# Patient Record
Sex: Female | Born: 1975 | Race: White | Hispanic: No | Marital: Married | Smoking: Former smoker
Health system: Southern US, Community
[De-identification: ages and names within clinical notes are randomized; demographics above are authoritative.]

## PROBLEM LIST (undated history)

## (undated) DIAGNOSIS — K118 Other diseases of salivary glands: Secondary | ICD-10-CM

## (undated) DIAGNOSIS — E079 Disorder of thyroid, unspecified: Secondary | ICD-10-CM

## (undated) DIAGNOSIS — E049 Nontoxic goiter, unspecified: Secondary | ICD-10-CM

## (undated) DIAGNOSIS — Z9889 Other specified postprocedural states: Secondary | ICD-10-CM

## (undated) DIAGNOSIS — I10 Essential (primary) hypertension: Secondary | ICD-10-CM

## (undated) DIAGNOSIS — E785 Hyperlipidemia, unspecified: Secondary | ICD-10-CM

## (undated) DIAGNOSIS — E039 Hypothyroidism, unspecified: Secondary | ICD-10-CM

## (undated) DIAGNOSIS — R112 Nausea with vomiting, unspecified: Secondary | ICD-10-CM

## (undated) DIAGNOSIS — C73 Malignant neoplasm of thyroid gland: Secondary | ICD-10-CM

## (undated) HISTORY — PX: ABLATION: SHX5711

---

## 2005-11-20 ENCOUNTER — Emergency Department (HOSPITAL_COMMUNITY): Admission: EM | Admit: 2005-11-20 | Discharge: 2005-11-20 | Payer: Self-pay | Admitting: Emergency Medicine

## 2005-11-25 ENCOUNTER — Ambulatory Visit (HOSPITAL_COMMUNITY): Admission: RE | Admit: 2005-11-25 | Discharge: 2005-11-25 | Payer: Self-pay | Admitting: Family Medicine

## 2005-11-26 ENCOUNTER — Encounter (HOSPITAL_COMMUNITY): Admission: RE | Admit: 2005-11-26 | Discharge: 2005-12-26 | Payer: Self-pay | Admitting: Family Medicine

## 2005-12-13 ENCOUNTER — Ambulatory Visit (HOSPITAL_COMMUNITY): Admission: RE | Admit: 2005-12-13 | Discharge: 2005-12-13 | Payer: Self-pay | Admitting: Gastroenterology

## 2006-05-30 ENCOUNTER — Ambulatory Visit (HOSPITAL_COMMUNITY): Admission: RE | Admit: 2006-05-30 | Discharge: 2006-05-30 | Payer: Self-pay | Admitting: Gastroenterology

## 2006-05-30 ENCOUNTER — Encounter (INDEPENDENT_AMBULATORY_CARE_PROVIDER_SITE_OTHER): Payer: Self-pay | Admitting: *Deleted

## 2006-08-17 ENCOUNTER — Emergency Department (HOSPITAL_COMMUNITY): Admission: EM | Admit: 2006-08-17 | Discharge: 2006-08-17 | Payer: Self-pay | Admitting: Emergency Medicine

## 2006-12-17 ENCOUNTER — Emergency Department (HOSPITAL_COMMUNITY): Admission: EM | Admit: 2006-12-17 | Discharge: 2006-12-17 | Payer: Self-pay | Admitting: Family Medicine

## 2007-01-22 ENCOUNTER — Ambulatory Visit: Payer: Self-pay | Admitting: Gastroenterology

## 2007-01-30 ENCOUNTER — Ambulatory Visit (HOSPITAL_COMMUNITY): Admission: RE | Admit: 2007-01-30 | Discharge: 2007-01-30 | Payer: Self-pay | Admitting: Internal Medicine

## 2007-08-24 ENCOUNTER — Ambulatory Visit (HOSPITAL_COMMUNITY): Admission: RE | Admit: 2007-08-24 | Discharge: 2007-08-24 | Payer: Self-pay | Admitting: Internal Medicine

## 2007-12-24 ENCOUNTER — Ambulatory Visit (HOSPITAL_COMMUNITY): Admission: RE | Admit: 2007-12-24 | Discharge: 2007-12-24 | Payer: Self-pay | Admitting: *Deleted

## 2008-03-09 ENCOUNTER — Encounter: Admission: RE | Admit: 2008-03-09 | Discharge: 2008-03-09 | Payer: Self-pay | Admitting: *Deleted

## 2008-06-10 ENCOUNTER — Inpatient Hospital Stay (HOSPITAL_COMMUNITY): Admission: AD | Admit: 2008-06-10 | Discharge: 2008-06-11 | Payer: Self-pay | Admitting: Obstetrics

## 2008-08-11 ENCOUNTER — Encounter: Admission: RE | Admit: 2008-08-11 | Discharge: 2008-08-11 | Payer: Self-pay | Admitting: Obstetrics

## 2008-09-09 ENCOUNTER — Inpatient Hospital Stay (HOSPITAL_COMMUNITY): Admission: AD | Admit: 2008-09-09 | Discharge: 2008-09-09 | Payer: Self-pay | Admitting: Obstetrics

## 2008-09-13 ENCOUNTER — Inpatient Hospital Stay (HOSPITAL_COMMUNITY): Admission: AD | Admit: 2008-09-13 | Discharge: 2008-09-14 | Payer: Self-pay | Admitting: Obstetrics

## 2008-10-17 ENCOUNTER — Inpatient Hospital Stay (HOSPITAL_COMMUNITY): Admission: AD | Admit: 2008-10-17 | Discharge: 2008-10-20 | Payer: Self-pay | Admitting: Obstetrics and Gynecology

## 2008-10-18 ENCOUNTER — Encounter (INDEPENDENT_AMBULATORY_CARE_PROVIDER_SITE_OTHER): Payer: Self-pay | Admitting: Obstetrics & Gynecology

## 2009-03-24 ENCOUNTER — Ambulatory Visit (HOSPITAL_COMMUNITY): Admission: RE | Admit: 2009-03-24 | Discharge: 2009-03-24 | Payer: Self-pay | Admitting: Internal Medicine

## 2009-06-05 ENCOUNTER — Encounter (INDEPENDENT_AMBULATORY_CARE_PROVIDER_SITE_OTHER): Payer: Self-pay | Admitting: Surgery

## 2009-06-05 ENCOUNTER — Ambulatory Visit (HOSPITAL_COMMUNITY): Admission: RE | Admit: 2009-06-05 | Discharge: 2009-06-05 | Payer: Self-pay | Admitting: Surgery

## 2009-06-05 HISTORY — PX: LAPAROSCOPIC CHOLECYSTECTOMY: SUR755

## 2010-05-12 ENCOUNTER — Inpatient Hospital Stay (HOSPITAL_COMMUNITY): Admission: AD | Admit: 2010-05-12 | Discharge: 2010-05-12 | Payer: Self-pay | Admitting: Obstetrics and Gynecology

## 2010-11-14 ENCOUNTER — Ambulatory Visit (HOSPITAL_COMMUNITY)
Admission: RE | Admit: 2010-11-14 | Discharge: 2010-11-14 | Payer: Self-pay | Source: Home / Self Care | Attending: Obstetrics | Admitting: Obstetrics

## 2011-02-03 LAB — URINALYSIS, ROUTINE W REFLEX MICROSCOPIC
Bilirubin Urine: NEGATIVE
Ketones, ur: NEGATIVE mg/dL
Nitrite: NEGATIVE
Protein, ur: NEGATIVE mg/dL
Urobilinogen, UA: 0.2 mg/dL (ref 0.0–1.0)

## 2011-02-03 LAB — URINE MICROSCOPIC-ADD ON

## 2011-02-03 LAB — HCG, QUANTITATIVE, PREGNANCY: hCG, Beta Chain, Quant, S: 1096 m[IU]/mL — ABNORMAL HIGH (ref <5)

## 2011-02-24 LAB — BASIC METABOLIC PANEL
Calcium: 9.9 mg/dL (ref 8.4–10.5)
Chloride: 102 mEq/L (ref 96–112)
Creatinine, Ser: 0.75 mg/dL (ref 0.4–1.2)
GFR calc non Af Amer: >60 mL/min (ref >60)

## 2011-02-24 LAB — GLUCOSE, CAPILLARY: Glucose-Capillary: 167 mg/dL — ABNORMAL HIGH (ref 70–99)

## 2011-02-24 LAB — HEMOGLOBIN AND HEMATOCRIT, BLOOD
HCT: 43.1 % (ref 36.0–46.0)
Hemoglobin: 14.4 g/dL (ref 12.0–15.0)

## 2011-04-02 ENCOUNTER — Encounter (HOSPITAL_COMMUNITY): Payer: 59

## 2011-04-02 ENCOUNTER — Other Ambulatory Visit: Payer: Self-pay | Admitting: Obstetrics

## 2011-04-02 LAB — SURGICAL PCR SCREEN
MRSA, PCR: NEGATIVE
Staphylococcus aureus: NEGATIVE

## 2011-04-02 LAB — CBC
Hemoglobin: 10.9 g/dL — ABNORMAL LOW (ref 12.0–15.0)
MCH: 25.2 pg — ABNORMAL LOW (ref 26.0–34.0)
MCV: 79 fL (ref 78.0–100.0)
RBC: 4.33 MIL/uL (ref 3.87–5.11)
WBC: 9.5 10*3/uL (ref 4.0–10.5)

## 2011-04-02 LAB — BASIC METABOLIC PANEL
BUN: 5 mg/dL — ABNORMAL LOW (ref 6–23)
CO2: 21 mEq/L (ref 19–32)
Chloride: 106 mEq/L (ref 96–112)
Creatinine, Ser: 0.57 mg/dL (ref 0.4–1.2)
GFR calc Af Amer: >60 mL/min (ref >60)
Potassium: 2.8 mEq/L — ABNORMAL LOW (ref 3.5–5.1)

## 2011-04-02 NOTE — Op Note (Signed)
NAMESUETTA, Carla Knight             ACCOUNT NO.:  [id number]   MEDICAL RECORD NO.:  [id number]          PATIENT TYPE:  AMB   LOCATION:  DAY                          FACILITY:  [facility]   PHYSICIAN:  Thornton Park. Daphine Deutscher, MD  DATE OF BIRTH:  1976/07/29   DATE OF PROCEDURE:  06/05/2009  DATE OF DISCHARGE:                               OPERATIVE REPORT   PREOPERATIVE DIAGNOSES:  Chronic cholecystitis, cholelithiasis.   POSTOPERATIVE DIAGNOSES:  Chronic cholecystitis, cholelithiasis also  with the cesarean section hernia, lower midline.   PROCEDURE:  Laparoscopic cholecystectomy.   SURGEON:  Thornton Park. Daphine Deutscher, MD.   ASSISTANTTroy Sine. Dwain Sarna, MD.   ANESTHESIA:  General endotracheal.   DESCRIPTION OF PROCEDURE:  Carla Knight is a 35 year old lady who was  taken to room 1 and given general anesthesia on Monday, June 05, 2009.  The abdomen was prepped with a Techni-Care equivalent and draped  sterilely.  I made a longitudinal incision down to the umbilicus and  worked our way into her abdomen and placed a Hasson cannula and  insufflated.  The abdomen was reviewed and focusing on the upper  abdomen.  The gallbladder was grasped, elevated.  Three trocars were  placed the upper abdomen for dissection of Calot's triangle which was  done and critical view was achieved.  A clip was placed up on the  gallbladder the cystic duct was incised and a Reddick catheter was  inserted.  A dynamic cholangiogram revealed good intrahepatic filling.  free flow into the duodenum, but also with some retrograde filling up  the pancreatic duct.  The patient has a common channel.  The cystic duct  was then triple clipped and divided.  The cystic artery was double  clipped and divided and then the gallbladder was removed from the  gallbladder bed without entering it with the hook electrocautery.  It  was placed in a bag and brought out through the umbilicus.  Meanwhile,  the umbilical port was repaired  with two sutures of zero Vicryl.  While  looking down at that closure we noticed that she had a C-section hernia  which was basically the entire length of the incision and it was fairly  broad.  This will be a consideration for repair if she has a subsequent  child and a C-section.  Nothing  was incarcerated in it nor was there  any and because of its size I  did not think  anything was in eminent danger of getting incarcerated.  In the  gallbladder bed no bleeding or bile leaks were noted.  The ports were  injected with Marcaine and then the abdomen was deflated and the skin  was closed 4-0 Vicryl.  The patient seemed to tolerate the procedure  well and was taken to the recovery room in satisfactory condition.      Thornton Park Daphine Deutscher, MD  Electronically Signed     MBM/MEDQ  D:  06/05/2009  T:  06/05/2009  Job:  [id number]   cc:   Kingsley Callander. Ouida Sills, MD  Fax: [phone]   Lendon Colonel, MD  Fax: [phone]

## 2011-04-02 NOTE — Op Note (Signed)
Carla Knight, Carla Knight             ACCOUNT NO.:  [id number]   MEDICAL RECORD NO.:  [id number]          PATIENT TYPE:  INP   LOCATION:  9117                          FACILITY:  [facility]   PHYSICIAN:  Genia Del, M.D.DATE OF BIRTH:  1975/11/20   DATE OF PROCEDURE:  DATE OF DISCHARGE:                               OPERATIVE REPORT   PREOPERATIVE DIAGNOSES:  1. 38+ weeks' gestation.  2. Gestational diabetes mellitus, A2.  3. Chronic hypertension.  4. Failure to progress.  5. Dilatation and descent.   POSTOPERATIVE DIAGNOSES:  1. 38+ weeks' gestation.  2. Gestational diabetes mellitus, A2.  3. Chronic hypertension.  4. Failure to progress.  5. Dilatation and descent.   PROCEDURE:  Urgent primary low-transverse C-section.   SURGEON:  Genia Del, MD   No assistant.   PROCEDURE:  Under epidural anesthesia, the patient was in 15 degrees  left decubitus position.  She was prepped with Betadine on the  abdominal, suprapubic, and vulvar areas.  The bladder catheter was  already in place and the patient was draped as usual.  The level of  anesthesia was verified and was adequate and infiltration of Marcaine  one-quarter plain 10 mL was done at the site of the Pfannenstiel.  A  Pfannenstiel incision was done with a scalpel.  We then opened the  adipose tissue with the electrocautery and opened the aponeurosis  transversely with the electrocautery and Mayo scissors.  We separated  the recti muscles from the aponeurosis on the midline superiorly and  inferiorly.  We opened the parietal peritoneum longitudinally with [facility]  scissors.  We then put in place the bladder retractor.  The visceral  peritoneum was opened transversely over the lower uterine segment and  the bladder was reclined downward.  The bladder retractor was  repositioned.  We then made a low-transverse hysterotomy with a scalpel  extension on each side with dressing scissors.  Amniotic fluid was  clear.  The fetus  was in cephalic presentation, occiput posterior, birth  of a baby girl at 6:19.  The baby was suctioned.  The cord was clamped  and cut.  The baby was given to the neonatal team.  Apgars are 8 and 9.  We then removed the placenta manually.  It was sent to Pathology.  The  patient is a cord blood donor.  Revision of the intrauterine cavity.  Pitocin was started in the IV fluids.  A dose of Ancef 1 g IV was given.  Both ovaries and both tubes were normal to inspection.  A small  subserosal fibroid 1 cm was present on the anterior surface of the  uterus.  We closed the hysterotomy and a first locked running suture of  Vicryl 0, a second plane in a mattress fashion was done with Vicryl 0,  that completes hemostasis.  We then irrigated and suctioned the  abdominopelvic cavities.  We completed hemostasis on the recti muscles  with the electrocautery where necessary.  We then closed the aponeurosis  with 2 half running sutures of Vicryl 0.  We completed hemostasis with  the electrocautery on the adipose tissue.  Separate stitches of plain 0  were  done on the adipose tissue, and the skin was reapproximated with  staples.  A dry compressive dressing was applied.  The estimated blood  loss was 800 mL.  The count of sponges and instrument was complete x2.  The patient was brought to recovery room in good stable status.      Genia Del, M.D.  Electronically Signed     ML/MEDQ  D:  10/18/2008  T:  10/18/2008  Job:  [id number]

## 2011-04-05 NOTE — Discharge Summary (Signed)
Carla Knight, Carla Knight             ACCOUNT NO.:  [id number]   MEDICAL RECORD NO.:  [id number]           PATIENT TYPE:   LOCATION:                                 FACILITY:   PHYSICIAN:  Genia Del, M.D.DATE OF BIRTH:  12/22/1975   DATE OF ADMISSION:  DATE OF DISCHARGE:  10/20/2008                               DISCHARGE SUMMARY   ADMISSION DIAGNOSES:  1. A 38 plus weeks' gestation.  2. Gestational diabetes mellitus A2.  3. Chronic hypertension with spontaneous labor and spontaneous rupture      of membranes.   DISCHARGE DIAGNOSES:  1. A 38 plus weeks' gestation.  2. Gestational diabetes mellitus A2.  3. Chronic hypertension with spontaneous labor and spontaneous rupture      of membranes.  4. Failure to progress in labor.   PROCEDURE:  Urgent primary low transverse C-section.   HOSPITAL COURSE:  The patient was in labor and progressed to 9 cm.  She  had an arrest of progression of dilatation and descent over a 3-hour  period with adequate uterine contractions.  The decision was taken to  proceed with an urgent C-section.  A low-transverse primary urgent C-  section was done.  A baby girl was born.  Apgars were 8 and 9.  No  complications occurred.  The estimated blood loss was 800 mL.  A dose of  Ancef IV was given.  The postop evaluation was unremarkable.  She  remained afebrile and hemodynamically stable.  Her postop hemoglobin was  8.3 and hematocrit 24.7.  She was discharged home in good stable status  on postop day #2.  She was continued on labetalol for chronic  hypertension at 400 mg t.i.d.  Pain medication was prescribed.  Postop  advice were given and she will follow up at [facility].      Genia Del, M.D.  Electronically Signed     ML/MEDQ  D:  12/14/2008  T:  12/14/2008  Job:  [id number]

## 2011-04-05 NOTE — Op Note (Signed)
NAMEMargarite Knight                ACCOUNT NO.:  [id number]   MEDICAL RECORD NO.:  [id number]          PATIENT TYPE:  AMB   LOCATION:  ENDO                         FACILITY:  [facility]   PHYSICIAN:  Anselmo Rod, M.D.  DATE OF BIRTH:  Jun 26, 1976   DATE OF PROCEDURE:  DATE OF DISCHARGE:                                 OPERATIVE REPORT   PROCEDURE PERFORMED:  Esophagogastroduodenoscopy with gastric biopsies.   ENDOSCOPIST:  Anselmo Rod MD.   INSTRUMENT USED:  Olyumpus video panendoscope.   INDICATIONS FOR PROCEDURE:  A 35 year old white female with a history of  epigastric pain radiating to the umbilical area, rule out peptic ulcer  disease, esophagitis, gastritis, etc.  Patient has had abnormal abdominal  ultrasound and HIDA scan with 74% ejection fraction.   PRE-PROCEDURE PREPARATION:  Informed consent was secured from the patient  and the patient  fasted for four hours prior to the procedure. The risks and  benefits of the procedure were discussed in great detail.   PRE-PROCEDURE PHYSICAL:  Patient stable vital signs. Neck supple. Chest  clear to auscultation. S1, S2, regular. Abdomen is soft with normal bowel  sounds.   DESCRIPTION OF PROCEDURE:  The patient is placed in the left lateral  decubitus position, sedated with 100 mcg of fentanyl and 10 mg of versed and  in slow incremental doses.  Once the patient was adequately sedated and  maintained on low-flow oxygen, continuous cardiac monitoring.  The Olympus  panendoscope was advanced through the mouth piece, over the tongue, into the  esophagus under direct vision. The entire esophagus appeared normal with no  evidence of ring, esophagitis or Barrett mucosa.  The scope was then  advanced in the stomach.  Mild diffuse gastritis was noted.  Biopsies were  done to rule out presence of H. Pylori.  The  proximal small bowel appeared  normal.  There was no evidence of a hiatal hernia or high retroflexion. The  patient  tolerated the procedure well without immediate complications.   IMPRESSION:  1.Normal appearing esophagus.  2.Mild diffuse gastritis, biopsies done for H. Pylori.  3.Normal proximal small bowel.   RECOMMENDATIONS:  1.Proceed with a CT of the abdomen and pelvis.  2.Continue PPIs for now.  3.Treat with antibiotics if H. Pylori present on biopsies.  4.The patient follow up,as an outpatient, in the next 2 weeks, for further  recommendations.      Anselmo Rod, M.D.  Electronically Signed     JNM/MEDQ  D:  05/30/2006  T:  05/31/2006  Job:  [id number]   cc:   Marina Gravel, M.D.   Kingsley Callander. Ouida Sills, MD  Fax: [phone]

## 2011-04-08 ENCOUNTER — Other Ambulatory Visit: Payer: Self-pay | Admitting: Obstetrics

## 2011-04-08 ENCOUNTER — Inpatient Hospital Stay (HOSPITAL_COMMUNITY)
Admission: RE | Admit: 2011-04-08 | Discharge: 2011-04-10 | DRG: 765 | Disposition: A | Discharge status: Home / Self Care | Payer: 59 | Source: Ambulatory Visit | Attending: Obstetrics | Admitting: Obstetrics

## 2011-04-08 DIAGNOSIS — O2432 Unspecified pre-existing diabetes mellitus in childbirth: Secondary | ICD-10-CM | POA: Diagnosis present

## 2011-04-08 DIAGNOSIS — E119 Type 2 diabetes mellitus without complications: Secondary | ICD-10-CM | POA: Diagnosis present

## 2011-04-08 DIAGNOSIS — O1002 Pre-existing essential hypertension complicating childbirth: Secondary | ICD-10-CM | POA: Diagnosis present

## 2011-04-08 DIAGNOSIS — O09529 Supervision of elderly multigravida, unspecified trimester: Secondary | ICD-10-CM | POA: Diagnosis present

## 2011-04-08 DIAGNOSIS — E079 Disorder of thyroid, unspecified: Secondary | ICD-10-CM | POA: Diagnosis present

## 2011-04-08 DIAGNOSIS — E0590 Thyrotoxicosis, unspecified without thyrotoxic crisis or storm: Secondary | ICD-10-CM | POA: Diagnosis present

## 2011-04-08 DIAGNOSIS — Z01812 Encounter for preprocedural laboratory examination: Secondary | ICD-10-CM

## 2011-04-08 DIAGNOSIS — Z302 Encounter for sterilization: Secondary | ICD-10-CM

## 2011-04-08 DIAGNOSIS — O34219 Maternal care for unspecified type scar from previous cesarean delivery: Principal | ICD-10-CM | POA: Diagnosis present

## 2011-04-08 DIAGNOSIS — Z01818 Encounter for other preprocedural examination: Secondary | ICD-10-CM

## 2011-04-08 LAB — GLUCOSE, CAPILLARY
Glucose-Capillary: 89 mg/dL (ref 70–99)
Glucose-Capillary: 95 mg/dL (ref 70–99)

## 2011-04-08 LAB — ABO/RH: ABO/RH(D): A NEG

## 2011-04-08 LAB — TYPE AND SCREEN
ABO/RH(D): A NEG
Antibody Screen: NEGATIVE

## 2011-04-09 LAB — CBC
Platelets: 245 10*3/uL (ref 150–400)
RBC: 3.89 MIL/uL (ref 3.87–5.11)
RDW: 15.4 % (ref 11.5–15.5)
WBC: 9.4 10*3/uL (ref 4.0–10.5)

## 2011-04-09 LAB — COMPREHENSIVE METABOLIC PANEL
AST: 13 U/L (ref 0–37)
Albumin: 2 g/dL — ABNORMAL LOW (ref 3.5–5.2)
Alkaline Phosphatase: 121 U/L — ABNORMAL HIGH (ref 39–117)
BUN: 6 mg/dL (ref 6–23)
Chloride: 101 mEq/L (ref 96–112)
GFR calc Af Amer: >60 mL/min (ref >60)
Potassium: 3.2 mEq/L — ABNORMAL LOW (ref 3.5–5.1)
Total Bilirubin: 0.6 mg/dL (ref 0.3–1.2)
Total Protein: 5.3 g/dL — ABNORMAL LOW (ref 6.0–8.3)

## 2011-04-09 LAB — GLUCOSE, CAPILLARY
Glucose-Capillary: 106 mg/dL — ABNORMAL HIGH (ref 70–99)
Glucose-Capillary: 144 mg/dL — ABNORMAL HIGH (ref 70–99)
Glucose-Capillary: 123 mg/dL — ABNORMAL HIGH (ref 70–99)

## 2011-04-10 LAB — COMPREHENSIVE METABOLIC PANEL
Albumin: 2.1 g/dL — ABNORMAL LOW (ref 3.5–5.2)
BUN: 5 mg/dL — ABNORMAL LOW (ref 6–23)
Calcium: 7.9 mg/dL — ABNORMAL LOW (ref 8.4–10.5)
Glucose, Bld: 108 mg/dL — ABNORMAL HIGH (ref 70–99)
Potassium: 3.6 mEq/L (ref 3.5–5.1)
Sodium: 141 mEq/L (ref 135–145)
Total Protein: 4.7 g/dL — ABNORMAL LOW (ref 6.0–8.3)

## 2011-04-10 LAB — GLUCOSE, CAPILLARY: Glucose-Capillary: 110 mg/dL — ABNORMAL HIGH (ref 70–99)

## 2011-04-12 ENCOUNTER — Ambulatory Visit (HOSPITAL_COMMUNITY)
Admission: RE | Admit: 2011-04-12 | Discharge: 2011-04-12 | Disposition: A | Discharge status: Home / Self Care | Payer: 59 | Source: Ambulatory Visit | Attending: Obstetrics | Admitting: Obstetrics

## 2011-04-14 ENCOUNTER — Inpatient Hospital Stay (HOSPITAL_COMMUNITY): Admission: AD | Admit: 2011-04-14 | Payer: Self-pay | Source: Home / Self Care | Admitting: Obstetrics

## 2011-04-14 NOTE — Op Note (Signed)
Carla Knight, Carla Knight             ACCOUNT NO.:  [id number]  MEDICAL RECORD NO.:  [id number]           PATIENT TYPE:  I  LOCATION:  9120                          FACILITY:  [facility]  PHYSICIAN:  Lendon Colonel, MD   DATE OF BIRTH:  27-Jul-1976  DATE OF PROCEDURE:  04/08/2011 DATE OF DISCHARGE:                              OPERATIVE REPORT   PREOPERATIVE DIAGNOSES: 1. Prior cesarean section. 2. A 39-week intrauterine pregnancy, undesired fertility.  POSTOPERATIVE DIAGNOSES: 1. Prior cesarean section. 2. A 39-week intrauterine pregnancy, undesired fertility.  PROCEDURES: 1. Repeat cesarean section. 2. Bilateral tubal ligation.  SURGEON:  Lendon Colonel, MD  ASSISTANT:  Arlan Organ, MD  ANESTHESIA:  Spinal.  FINDINGS:  Female infant, 9 pounds 13 ounces, Apgars 9 and 10 in the direct OA position.  Normal uterus, normal tubes, and normal ovaries.  SPECIMENS:  Placenta to Pathology.  ANTIBIOTICS:  A 1 g of Ancef.  ESTIMATED BLOOD LOSS:  700 mL.  COMPLICATIONS:  None.  INDICATIONS:  This is a 35 year old G3, P1 with history of type 2 diabetes on insulin, chronic hypertension on labetalol, who presented at 39 weeks for repeat cesarean section and bilateral tubal ligation. Risks, benefits, and alternatives of procedure including attempted TOLAC were discussed with the patient.  DESCRIPTION OF PROCEDURE:  After informed was obtained, the patient was taken to the operating room where spinal anesthesia was initiated without difficulty.  She was prepped and draped in the normal sterile fashion in dorsal spine position with leftward tilt.  Foley catheter was inserted sterilely into the bladder.  Pfannenstiel skin incision was made over the old incision with the scalpel carried through to the underlying layer of fascia with the Bovie cautery.  While dissecting down to the fascia with the Bovie cautery, some clear fluid began to spill from the abdomen into the surgical field.  At  this point, Bovie cautery was discontinued and sharp dissection with Mayo scissors was done to further identify the fascial layer.  At the fascial layer, the fascia was then divided with the Mayo scissors.  There was no appreciable peritoneum and the bladder was seen, adhered quite close to the fascial edge.  The rectus muscles were splayed in the midline and the uterus was clearly visible.  Kocher clamps were placed on the superior aspect of the fascial incision, however, there was no additional dissection done.  Some scarred rectus muscles on the right were about 2 cm worth were taken with a lateral incision with the Bovie cautery to allow adequate room.  The bladder blade was inserted.  The peritoneum was identified, grasped with pickups, entered sharply and the bladder blade flap was created with a combination of digital and sharp dissection.  Palpation was done to assess the size and the position of the uterus and the baby.  A lower uterine segment was incised in a transverse fashion with the scalpel, extended with the bandage scissors. The infant's occiput was grasped, brought to the incision.  However, due to the size of the incision and the size of the head, was unable to deliver the baby.  At this point, a  vacuum was called for, a vacuum was placed in the fetal occiput and with use of the vacuum, realigned the head with the uterine incision.  The infant's occiput delivered.  Vacuum was removed.  Shoulders were delivered without complication.  Nose and mouth were bulb suctioned.  Cord was clamped and cut.  The infant was handed off to the awaiting pediatrician.  A true knot was noted in the umbilical cord.  The placenta was then expressed.  Attempt was made to exteriorize the uterus but was unable given the size of the uterus and the opening in the fascia.  Tags were placed on the angles of the incision.  The uterus was cleared of all clots and debris.  Uterine incision was  repaired with 0 Vicryl in a running locked fashion.  Second layer of the suture was used vertical imbricating fashion to obtain good hemostasis, however, 4 additional figure-of-eight sutures were placed along the midpoint of the incision for hemostasis.  At this point, the uterus was again attempted to be exteriorized but was unable, decision was made to just rock the uterus and perform the tubal that way.  The bladder blade was reinserted.  The left tube was identified, carried out to the fimbriated end.  A 2-cm knuckle of tube was pulled up in the mid isthmic portion and tied off with a free tie of plain gut x2, mesosalpinx and tubal segments were then excised in similar fashion. The right tube had a 2-cm knuckle and tube pulled up.  Free tie of plain gut x2 and tubal was done to free.  End of the tube were evaluated and found hemostatic.  The uterus was rocked back into the pelvis.  The uterine incision was reinspected.  Good hemostasis was noted.  The tagged edges were cut.  No peritoneum was noted.  At this point, the bladder was filled with 350 mL of saline with methylene blue.  The edge of the bladder could be better evaluated.  There were no defects in the bladder.  The bladder was felt to be far from the uterine incision and while the bladder was closed to the fascial incision, there was no defect in the bladder.  The fascia was then closed with 0 Vicryl in 2 halves with 0 Vicryl.  A 2-0 plain was used to close Scarpa layer after cleaning and the skin was closed with staples.  The patient tolerated the procedure well.  Sponge, lap, and needle counts were correct x3, and the patient was taken to the recovery room in stable condition.     Lendon Colonel, MD     KAF/MEDQ  D:  04/08/2011  T:  04/09/2011  Job:  [id number]  Electronically Signed by Noland Fordyce MD on 04/14/2011 01:16:51 PM

## 2011-05-10 NOTE — Discharge Summary (Signed)
Carla, Knight             ACCOUNT NO.:  [id number]  MEDICAL RECORD NO.:  [id number]           PATIENT TYPE:  I  LOCATION:  9120                          FACILITY:  [facility]  PHYSICIAN:  Lendon Colonel, MD   DATE OF BIRTH:  [birth year]  DATE OF ADMISSION:  04/08/2011 DATE OF DISCHARGE:  04/10/2011                              DISCHARGE SUMMARY   ADMITTING DIAGNOSES:  35 weeks' gestation, previous cesarean section, type 2 diabetes insulin-requiring in pregnancy, desires repeat cesarean section.  DISCHARGE DIAGNOSES:  Postoperative day #2, status post cesarean section repeat, chronic hypertension, obesity, subclinical hyperthyroidism, and diabetes type 2.  HISTORY:  The patient is a gravida 3, para 1-0-1-1 at 76 weeks' gestation with an [facility] of Apr 14, 2011.  The patient has received prenatal care at [facility] since 8 weeks' gestation with Dr. Ernestina Penna as primary.  PRENATAL LABORATORY DATA:  Blood type is A+, antibody screen negative, rubella positive, GBS negative, HIV negative, RPR negative, and hepatitis B negative.  Prenatal course was complicated by obesity, chronic hypertension, type 2 diabetes insulin requiring during pregnancy, goiter with subclinical hyperthyroidism.  MEDICAL HISTORY:   1. Chronic hypertension. 2. Diabetes type 2. 3. Goiter with subclinical hyperthyroidism. 4. Obesity. 5. Allergic rhinitis.  SURGICAL HISTORY:  Cesarean section x1 and cholecystectomy.  ALLERGIES:  None.  MEDICATIONS AT THE TIME OF ADMISSION: 1. Prenatal vitamin 1 tablet daily. 2. Zantac 150 mg p.o. daily. 3. Labetalol 100 mg b.i.d. 4. Humulin N 28-70 units b.i.d. 5. Regular Humulin 8-16 units b.i.d.  PRESENTATION:  The patient presents for scheduled cesarean section.  PROCEDURE:  The patient undergoes cesarean section by Dr. Ernestina Penna on day of admission with a female transferred to the Regular Nurs[facility].  See operative report for further details.   POSTOPERATIVE COURSE:  The  patient's postoperative course was complicated by labile hypertension.  The patient was managed on labetalol 200 mg p.o. t.i.d. Blood sugars were stable during hospitalization with only slightly elevated fasting  blood sugars managed at time of discharge. Metformin 500 mg b.i.d. was to increase to 500 in the a.m. and 1000 in the p.m. on day of discharge. Patient instructed to continue blood sugar monitoring postpartum. Vital signs were stable. The patient is afebrile. Physical exam is within normal limits.  Wound edges are well-approximated with staples. There is no erythema, no ecchymosis or drainage at the site.  Abdominal pannus was soft.  No erythema, no evidence of seroma.  The patient is discharged home in stable condition, to follow up at [facility] at 7 days postop for blood pressure check, blood sugar review, and staple removal.  DIET:  Carb modified, sodium restricted.  ACTIVITY:  Postoperative restrictions x2 weeks.  POSTPARTUM INSTRUCTIONS:  Per to [facility] booklet.  DISCHARGE MEDICATIONS: 1. Prenatal vitamin 1 tablet p.o. daily. 2. Colace 1-3 tablets daily as needed for stool softening. 3. Ibuprofen 800 mg every 8 hours as needed for discomfort. 4. Percocet 1-2 tablets every 4-6 hours as needed for pain. 5. Labetalol 200 mg p.o. t.i.d. 6. Metformin 500 in the a.m. and 1000 in the p.m.     Marlinda Mike, C.N.M.  ______________________________ Lendon Colonel, MD    TB/MEDQ  D:  04/11/2011  T:  04/12/2011  Job:  [id number]  Electronically Signed by Marlinda Mike C.N.M. on 04/16/2011 11:00:47 AM Electronically Signed by Noland Fordyce MD on 05/10/2011 08:47:30 AM

## 2011-05-20 ENCOUNTER — Other Ambulatory Visit: Payer: Self-pay | Admitting: Obstetrics

## 2011-08-16 LAB — CBC
HCT: 33.7 — ABNORMAL LOW
Hemoglobin: 11.3 — ABNORMAL LOW
MCV: 82.2
WBC: 11.7 — ABNORMAL HIGH

## 2011-08-16 LAB — TYPE AND SCREEN
ABO/RH(D): A NEG
Antibody Screen: NEGATIVE

## 2011-08-16 LAB — GLUCOSE, CAPILLARY: Glucose-Capillary: 97

## 2011-08-16 LAB — TSH: TSH: <0.004 — ABNORMAL LOW

## 2011-08-16 LAB — ABO/RH: ABO/RH(D): A NEG

## 2011-08-19 LAB — COMPREHENSIVE METABOLIC PANEL
ALT: 12
BUN: 5 — ABNORMAL LOW
CO2: 23
Calcium: 8.4
GFR calc non Af Amer: >60
Glucose, Bld: 61 — ABNORMAL LOW
Sodium: 138

## 2011-08-19 LAB — CBC
HCT: 34.5 — ABNORMAL LOW
Hemoglobin: 11.2 — ABNORMAL LOW
MCHC: 32.5
MCV: 80.6
RBC: 4.27

## 2011-08-19 LAB — GLUCOSE, CAPILLARY
Glucose-Capillary: 38 — CL
Glucose-Capillary: 78

## 2011-08-19 LAB — T3, FREE: T3, Free: 3.5 (ref 2.3–4.2)

## 2011-08-19 LAB — T4, FREE: Free T4: 0.99

## 2011-08-19 LAB — PROTEIN, URINE, 24 HOUR
Protein, 24H Urine: 89
Urine Total Volume-UPROT: 2975

## 2011-08-19 LAB — TSH: TSH: <0.004 — ABNORMAL LOW

## 2011-08-19 LAB — CREATININE CLEARANCE, URINE, 24 HOUR
Creatinine Clearance: 182 — ABNORMAL HIGH
Creatinine, Urine: 46.7

## 2011-08-20 LAB — COMPREHENSIVE METABOLIC PANEL
Alkaline Phosphatase: 153 U/L — ABNORMAL HIGH (ref 39–117)
BUN: 3 mg/dL — ABNORMAL LOW (ref 6–23)
CO2: 24 mEq/L (ref 19–32)
Chloride: 106 mEq/L (ref 96–112)
Glucose, Bld: 78 mg/dL (ref 70–99)
Potassium: 4.1 mEq/L (ref 3.5–5.1)
Total Bilirubin: 0.3 mg/dL (ref 0.3–1.2)

## 2011-08-20 LAB — URIC ACID: Uric Acid, Serum: 4.5 mg/dL (ref 2.4–7.0)

## 2011-08-20 LAB — GLUCOSE, CAPILLARY
Glucose-Capillary: 111 mg/dL — ABNORMAL HIGH (ref 70–99)
Glucose-Capillary: 74 mg/dL (ref 70–99)
Glucose-Capillary: 81 mg/dL (ref 70–99)
Glucose-Capillary: 85 mg/dL (ref 70–99)
Glucose-Capillary: 93 mg/dL (ref 70–99)
Glucose-Capillary: 97 mg/dL (ref 70–99)

## 2011-08-20 LAB — LACTATE DEHYDROGENASE: LDH: 105 U/L (ref 94–250)

## 2011-08-20 LAB — CBC
HCT: 36.2 % (ref 36.0–46.0)
Hemoglobin: 12 g/dL (ref 12.0–15.0)
WBC: 13.4 10*3/uL — ABNORMAL HIGH (ref 4.0–10.5)

## 2011-08-20 LAB — RPR: RPR Ser Ql: NONREACTIVE

## 2011-08-23 LAB — CBC
HCT: 24.7 % — ABNORMAL LOW (ref 36.0–46.0)
Hemoglobin: 8.3 g/dL — ABNORMAL LOW (ref 12.0–15.0)
MCHC: 33.6 g/dL (ref 30.0–36.0)
MCV: 81.8 fL (ref 78.0–100.0)
RBC: 3.02 MIL/uL — ABNORMAL LOW (ref 3.87–5.11)

## 2011-08-23 LAB — GLUCOSE, CAPILLARY
Glucose-Capillary: 104 mg/dL — ABNORMAL HIGH (ref 70–99)
Glucose-Capillary: 109 mg/dL — ABNORMAL HIGH (ref 70–99)
Glucose-Capillary: 111 mg/dL — ABNORMAL HIGH (ref 70–99)
Glucose-Capillary: 112 mg/dL — ABNORMAL HIGH (ref 70–99)
Glucose-Capillary: 114 mg/dL — ABNORMAL HIGH (ref 70–99)
Glucose-Capillary: 117 mg/dL — ABNORMAL HIGH (ref 70–99)
Glucose-Capillary: 89 mg/dL (ref 70–99)

## 2011-08-23 LAB — CCBB MATERNAL DONOR DRAW

## 2011-10-31 ENCOUNTER — Ambulatory Visit (INDEPENDENT_AMBULATORY_CARE_PROVIDER_SITE_OTHER): Payer: BC Managed Care – PPO | Admitting: Otolaryngology

## 2011-10-31 DIAGNOSIS — K1120 Sialoadenitis, unspecified: Secondary | ICD-10-CM

## 2011-11-06 ENCOUNTER — Other Ambulatory Visit (INDEPENDENT_AMBULATORY_CARE_PROVIDER_SITE_OTHER): Payer: Self-pay | Admitting: Otolaryngology

## 2011-11-06 DIAGNOSIS — R220 Localized swelling, mass and lump, head: Secondary | ICD-10-CM

## 2011-11-06 DIAGNOSIS — R221 Localized swelling, mass and lump, neck: Secondary | ICD-10-CM

## 2011-11-08 ENCOUNTER — Ambulatory Visit (HOSPITAL_COMMUNITY)
Admission: RE | Admit: 2011-11-08 | Discharge: 2011-11-08 | Disposition: A | Discharge status: Home / Self Care | Payer: BC Managed Care – PPO | Source: Ambulatory Visit | Attending: Otolaryngology | Admitting: Otolaryngology

## 2011-11-08 ENCOUNTER — Ambulatory Visit (HOSPITAL_COMMUNITY): Payer: BC Managed Care – PPO

## 2011-11-08 DIAGNOSIS — R221 Localized swelling, mass and lump, neck: Secondary | ICD-10-CM

## 2011-11-08 DIAGNOSIS — E079 Disorder of thyroid, unspecified: Secondary | ICD-10-CM | POA: Insufficient documentation

## 2011-11-08 DIAGNOSIS — R220 Localized swelling, mass and lump, head: Secondary | ICD-10-CM | POA: Insufficient documentation

## 2011-11-08 LAB — POCT I-STAT, CHEM 8
Chloride: 102 mEq/L (ref 96–112)
Creatinine, Ser: 0.6 mg/dL (ref 0.50–1.10)
Hemoglobin: 14.6 g/dL (ref 12.0–15.0)
Potassium: 3.3 mEq/L — ABNORMAL LOW (ref 3.5–5.1)
Sodium: 136 mEq/L (ref 135–145)

## 2011-11-08 MED ORDER — IOHEXOL 300 MG/ML  SOLN
100.0000 mL | Freq: Once | INTRAMUSCULAR | Status: AC | PRN
  Administered 2011-11-08: 100 mL via INTRAVENOUS

## 2011-11-19 DIAGNOSIS — K118 Other diseases of salivary glands: Secondary | ICD-10-CM

## 2011-11-19 HISTORY — DX: Other diseases of salivary glands: K11.8

## 2011-11-21 ENCOUNTER — Ambulatory Visit (INDEPENDENT_AMBULATORY_CARE_PROVIDER_SITE_OTHER): Payer: BC Managed Care – PPO | Admitting: Otolaryngology

## 2011-11-21 DIAGNOSIS — D37039 Neoplasm of uncertain behavior of the major salivary glands, unspecified: Secondary | ICD-10-CM

## 2011-12-10 ENCOUNTER — Encounter (HOSPITAL_BASED_OUTPATIENT_CLINIC_OR_DEPARTMENT_OTHER)
Admission: RE | Admit: 2011-12-10 | Discharge: 2011-12-10 | Disposition: A | Discharge status: Home / Self Care | Payer: BC Managed Care – PPO | Source: Ambulatory Visit | Attending: Otolaryngology | Admitting: Otolaryngology

## 2011-12-10 ENCOUNTER — Encounter (HOSPITAL_BASED_OUTPATIENT_CLINIC_OR_DEPARTMENT_OTHER): Payer: Self-pay | Admitting: *Deleted

## 2011-12-10 NOTE — Pre-Procedure Instructions (Signed)
To come for BMET and EKG 

## 2011-12-12 ENCOUNTER — Encounter (HOSPITAL_BASED_OUTPATIENT_CLINIC_OR_DEPARTMENT_OTHER)
Admission: RE | Admit: 2011-12-12 | Discharge: 2011-12-12 | Disposition: A | Discharge status: Home / Self Care | Payer: BC Managed Care – PPO | Source: Ambulatory Visit | Attending: Otolaryngology | Admitting: Otolaryngology

## 2011-12-12 ENCOUNTER — Other Ambulatory Visit: Payer: Self-pay

## 2011-12-12 LAB — BASIC METABOLIC PANEL
BUN: 15 mg/dL (ref 6–23)
CO2: 27 mEq/L (ref 19–32)
Calcium: 10 mg/dL (ref 8.4–10.5)
Creatinine, Ser: 0.61 mg/dL (ref 0.50–1.10)

## 2011-12-17 ENCOUNTER — Encounter (HOSPITAL_BASED_OUTPATIENT_CLINIC_OR_DEPARTMENT_OTHER): Payer: Self-pay | Admitting: Anesthesiology

## 2011-12-17 ENCOUNTER — Other Ambulatory Visit (INDEPENDENT_AMBULATORY_CARE_PROVIDER_SITE_OTHER): Payer: Self-pay | Admitting: Otolaryngology

## 2011-12-17 ENCOUNTER — Encounter (HOSPITAL_BASED_OUTPATIENT_CLINIC_OR_DEPARTMENT_OTHER): Payer: Self-pay

## 2011-12-17 ENCOUNTER — Ambulatory Visit (HOSPITAL_BASED_OUTPATIENT_CLINIC_OR_DEPARTMENT_OTHER)
Admission: RE | Admit: 2011-12-17 | Discharge: 2011-12-18 | Disposition: A | Discharge status: Home / Self Care | Payer: BC Managed Care – PPO | Source: Ambulatory Visit | Attending: Otolaryngology | Admitting: Otolaryngology

## 2011-12-17 ENCOUNTER — Encounter (HOSPITAL_BASED_OUTPATIENT_CLINIC_OR_DEPARTMENT_OTHER)
Admission: RE | Disposition: A | Discharge status: Home / Self Care | Payer: Self-pay | Source: Ambulatory Visit | Attending: Otolaryngology

## 2011-12-17 ENCOUNTER — Ambulatory Visit (HOSPITAL_BASED_OUTPATIENT_CLINIC_OR_DEPARTMENT_OTHER): Payer: BC Managed Care – PPO | Admitting: Anesthesiology

## 2011-12-17 DIAGNOSIS — D119 Benign neoplasm of major salivary gland, unspecified: Secondary | ICD-10-CM | POA: Insufficient documentation

## 2011-12-17 DIAGNOSIS — Z01812 Encounter for preprocedural laboratory examination: Secondary | ICD-10-CM | POA: Insufficient documentation

## 2011-12-17 DIAGNOSIS — Z9049 Acquired absence of other specified parts of digestive tract: Secondary | ICD-10-CM

## 2011-12-17 DIAGNOSIS — I10 Essential (primary) hypertension: Secondary | ICD-10-CM | POA: Insufficient documentation

## 2011-12-17 DIAGNOSIS — Z01810 Encounter for preprocedural cardiovascular examination: Secondary | ICD-10-CM | POA: Insufficient documentation

## 2011-12-17 DIAGNOSIS — E119 Type 2 diabetes mellitus without complications: Secondary | ICD-10-CM | POA: Insufficient documentation

## 2011-12-17 HISTORY — DX: Other diseases of salivary glands: K11.8

## 2011-12-17 HISTORY — DX: Nontoxic goiter, unspecified: E04.9

## 2011-12-17 HISTORY — PX: PAROTIDECTOMY: SHX2163

## 2011-12-17 HISTORY — DX: Essential (primary) hypertension: I10

## 2011-12-17 LAB — GLUCOSE, CAPILLARY
Glucose-Capillary: 151 mg/dL — ABNORMAL HIGH (ref 70–99)
Glucose-Capillary: 135 mg/dL — ABNORMAL HIGH (ref 70–99)

## 2011-12-17 SURGERY — EXCISION, PAROTID GLAND
Anesthesia: General | Site: Face | Laterality: Right | Wound class: Clean

## 2011-12-17 MED ORDER — MIDAZOLAM HCL 5 MG/5ML IJ SOLN
INTRAMUSCULAR | Status: DC | PRN
  Administered 2011-12-17: 2 mg via INTRAVENOUS

## 2011-12-17 MED ORDER — FENTANYL CITRATE 0.05 MG/ML IJ SOLN
INTRAMUSCULAR | Status: DC | PRN
  Administered 2011-12-17 (×2): 25 ug via INTRAVENOUS
  Administered 2011-12-17: 100 ug via INTRAVENOUS
  Administered 2011-12-17: 50 ug via INTRAVENOUS
  Administered 2011-12-17 (×4): 25 ug via INTRAVENOUS
  Administered 2011-12-17: 50 ug via INTRAVENOUS
  Administered 2011-12-17 (×2): 25 ug via INTRAVENOUS
  Administered 2011-12-17: 50 ug via INTRAVENOUS
  Administered 2011-12-17 (×2): 25 ug via INTRAVENOUS
  Administered 2011-12-17: 50 ug via INTRAVENOUS
  Administered 2011-12-17: 75 ug via INTRAVENOUS

## 2011-12-17 MED ORDER — LIDOCAINE-EPINEPHRINE 1 %-1:100000 IJ SOLN
INTRAMUSCULAR | Status: DC | PRN
  Administered 2011-12-17: 5 mL

## 2011-12-17 MED ORDER — ONDANSETRON HCL 4 MG/2ML IJ SOLN
INTRAMUSCULAR | Status: DC | PRN
  Administered 2011-12-17: 4 mg via INTRAVENOUS

## 2011-12-17 MED ORDER — PROMETHAZINE HCL 25 MG/ML IJ SOLN
25.0000 mg | Freq: Four times a day (QID) | INTRAMUSCULAR | Status: DC | PRN
  Administered 2011-12-17: 12.5 mg via INTRAVENOUS

## 2011-12-17 MED ORDER — METFORMIN HCL 500 MG PO TABS
500.0000 mg | ORAL_TABLET | Freq: Two times a day (BID) | ORAL | Status: DC
  Administered 2011-12-18: 500 mg via ORAL

## 2011-12-17 MED ORDER — EPHEDRINE SULFATE 50 MG/ML IJ SOLN
INTRAMUSCULAR | Status: DC | PRN
  Administered 2011-12-17 (×3): 5 mg via INTRAVENOUS
  Administered 2011-12-17: 10 mg via INTRAVENOUS
  Administered 2011-12-17 (×2): 5 mg via INTRAVENOUS

## 2011-12-17 MED ORDER — OXYCODONE-ACETAMINOPHEN 5-325 MG PO TABS
1.0000 | ORAL_TABLET | ORAL | Status: DC | PRN
  Administered 2011-12-18 (×2): 2 via ORAL

## 2011-12-17 MED ORDER — SODIUM CHLORIDE 0.9 % IV SOLN
INTRAVENOUS | Status: DC
  Administered 2011-12-17: 16:00:00 via INTRAVENOUS

## 2011-12-17 MED ORDER — CEFAZOLIN SODIUM 1-5 GM-% IV SOLN: 1.0000 g | Freq: Once | INTRAVENOUS | Status: DC

## 2011-12-17 MED ORDER — ACETAMINOPHEN 10 MG/ML IV SOLN
1000.0000 mg | Freq: Once | INTRAVENOUS | Status: AC
  Administered 2011-12-17: 1000 mg via INTRAVENOUS

## 2011-12-17 MED ORDER — INSULIN ASPART 100 UNIT/ML ~~LOC~~ SOLN
0.0000 [IU] | Freq: Three times a day (TID) | SUBCUTANEOUS | Status: DC
  Administered 2011-12-17: 7 [IU] via SUBCUTANEOUS
  Administered 2011-12-18: 3 [IU] via SUBCUTANEOUS

## 2011-12-17 MED ORDER — MORPHINE SULFATE 4 MG/ML IJ SOLN: 2.0000 mg | INTRAMUSCULAR | Status: DC | PRN

## 2011-12-17 MED ORDER — LISINOPRIL 10 MG PO TABS
10.0000 mg | ORAL_TABLET | Freq: Every day | ORAL | Status: DC
  Administered 2011-12-18: 10 mg via ORAL

## 2011-12-17 MED ORDER — INSULIN ASPART 100 UNIT/ML ~~LOC~~ SOLN: 0.0000 [IU] | SUBCUTANEOUS | Status: DC

## 2011-12-17 MED ORDER — HYDROCHLOROTHIAZIDE 25 MG PO TABS
25.0000 mg | ORAL_TABLET | Freq: Every day | ORAL | Status: DC
  Administered 2011-12-18: 25 mg via ORAL

## 2011-12-17 MED ORDER — PROPOFOL 10 MG/ML IV EMUL
INTRAVENOUS | Status: DC | PRN
  Administered 2011-12-17: 100 mg via INTRAVENOUS
  Administered 2011-12-17: 200 mg via INTRAVENOUS
  Administered 2011-12-17: 60 mg via INTRAVENOUS

## 2011-12-17 MED ORDER — CEFAZOLIN SODIUM 1-5 GM-% IV SOLN
1.0000 g | Freq: Three times a day (TID) | INTRAVENOUS | Status: DC
  Administered 2011-12-17 – 2011-12-18 (×2): 1 g via INTRAVENOUS

## 2011-12-17 MED ORDER — KCL IN DEXTROSE-NACL 20-5-0.45 MEQ/L-%-% IV SOLN: INTRAVENOUS | Status: DC

## 2011-12-17 MED ORDER — SCOPOLAMINE 1 MG/3DAYS TD PT72
1.0000 | MEDICATED_PATCH | TRANSDERMAL | Status: DC
  Administered 2011-12-17: 1.5 mg via TRANSDERMAL

## 2011-12-17 MED ORDER — LACTATED RINGERS IV SOLN
INTRAVENOUS | Status: DC
  Administered 2011-12-17 (×3): via INTRAVENOUS

## 2011-12-17 MED ORDER — PROMETHAZINE HCL 25 MG/ML IJ SOLN: 6.2500 mg | INTRAMUSCULAR | Status: DC | PRN

## 2011-12-17 MED ORDER — HYDROMORPHONE HCL PF 1 MG/ML IJ SOLN
0.2500 mg | INTRAMUSCULAR | Status: DC | PRN
  Administered 2011-12-17 (×2): 0.5 mg via INTRAVENOUS

## 2011-12-17 MED ORDER — ZOLPIDEM TARTRATE 5 MG PO TABS: 5.0000 mg | ORAL_TABLET | Freq: Every evening | ORAL | Status: DC | PRN

## 2011-12-17 MED ORDER — SUCCINYLCHOLINE CHLORIDE 20 MG/ML IJ SOLN
INTRAMUSCULAR | Status: DC | PRN
  Administered 2011-12-17: 120 mg via INTRAVENOUS

## 2011-12-17 MED ORDER — CEFAZOLIN SODIUM 1-5 GM-% IV SOLN
INTRAVENOUS | Status: DC | PRN
  Administered 2011-12-17: 2 g via INTRAVENOUS

## 2011-12-17 MED ORDER — LIDOCAINE HCL (CARDIAC) 20 MG/ML IV SOLN
INTRAVENOUS | Status: DC | PRN
  Administered 2011-12-17: 40 mg via INTRAVENOUS

## 2011-12-17 SURGICAL SUPPLY — 59 items
APPLICATOR DR MATTHEWS STRL (MISCELLANEOUS) ×2 IMPLANT
ATTRACTOMAT 16X20 MAGNETIC DRP (DRAPES) ×2 IMPLANT
BAG DECANTER FOR FLEXI CONT (MISCELLANEOUS) IMPLANT
BLADE SURG 15 STRL LF DISP TIS (BLADE) ×1 IMPLANT
BLADE SURG 15 STRL SS (BLADE) ×1
CANISTER SUCTION 1200CC (MISCELLANEOUS) ×2 IMPLANT
CLOTH BEACON ORANGE TIMEOUT ST (SAFETY) ×2 IMPLANT
CORDS BIPOLAR (ELECTRODE) ×2 IMPLANT
COVER MAYO STAND STRL (DRAPES) ×2 IMPLANT
COVER TABLE BACK 60X90 (DRAPES) ×2 IMPLANT
DECANTER SPIKE VIAL GLASS SM (MISCELLANEOUS) ×2 IMPLANT
DERMABOND ADVANCED (GAUZE/BANDAGES/DRESSINGS) ×1
DERMABOND ADVANCED .7 DNX12 (GAUZE/BANDAGES/DRESSINGS) ×1 IMPLANT
DRAIN CHANNEL 7F FF FLAT (WOUND CARE) ×2 IMPLANT
DRAIN TLS ROUND 10FR (DRAIN) IMPLANT
DRAPE INCISE 23X17 IOBAN STRL (DRAPES) ×1
DRAPE INCISE IOBAN 23X17 STRL (DRAPES) ×1 IMPLANT
DRAPE U-SHAPE 76X120 STRL (DRAPES) ×2 IMPLANT
ELECT COATED BLADE 2.86 ST (ELECTRODE) ×2 IMPLANT
ELECT PAIRED SUBDERMAL (MISCELLANEOUS) ×2
ELECT REM PT RETURN 9FT ADLT (ELECTROSURGICAL) ×2
ELECTRODE PAIRED SUBDERMAL (MISCELLANEOUS) ×1 IMPLANT
ELECTRODE REM PT RTRN 9FT ADLT (ELECTROSURGICAL) ×1 IMPLANT
EVACUATOR SILICONE 100CC (DRAIN) ×2 IMPLANT
GAUZE SPONGE 4X4 12PLY STRL LF (GAUZE/BANDAGES/DRESSINGS) IMPLANT
GAUZE SPONGE 4X4 16PLY XRAY LF (GAUZE/BANDAGES/DRESSINGS) ×6 IMPLANT
GLOVE BIO SURGEON STRL SZ7.5 (GLOVE) ×2 IMPLANT
GLOVE ECLIPSE 7.5 STRL STRAW (GLOVE) ×2 IMPLANT
GOWN PREVENTION PLUS XLARGE (GOWN DISPOSABLE) ×6 IMPLANT
LOCATOR NERVE 3 VOLT (DISPOSABLE) IMPLANT
NEEDLE HYPO 25X1 1.5 SAFETY (NEEDLE) ×2 IMPLANT
NS IRRIG 1000ML POUR BTL (IV SOLUTION) ×2 IMPLANT
PACK BASIN DAY SURGERY FS (CUSTOM PROCEDURE TRAY) ×2 IMPLANT
PAD ALCOHOL SWAB (MISCELLANEOUS) ×6 IMPLANT
PENCIL BUTTON HOLSTER BLD 10FT (ELECTRODE) ×2 IMPLANT
PIN SAFETY STERILE (MISCELLANEOUS) ×2 IMPLANT
PROBE NERVBE PRASS .33 (MISCELLANEOUS) ×2 IMPLANT
SLEEVE SCD COMPRESS KNEE MED (MISCELLANEOUS) ×2 IMPLANT
SPONGE INTESTINAL PEANUT (DISPOSABLE) ×6 IMPLANT
STAPLER VISISTAT 35W (STAPLE) IMPLANT
SUT CHROMIC 4 0 PS 2 18 (SUTURE) IMPLANT
SUT ETHILON 3 0 PS 1 (SUTURE) ×2 IMPLANT
SUT PROLENE 5 0 P 3 (SUTURE) ×2 IMPLANT
SUT SILK 2 0 FS (SUTURE) ×4 IMPLANT
SUT SILK 2 0 TIES 17X18 (SUTURE)
SUT SILK 2-0 18XBRD TIE BLK (SUTURE) IMPLANT
SUT SILK 3 0 PS 1 (SUTURE) IMPLANT
SUT SILK 3 0 TIES 17X18 (SUTURE) ×1
SUT SILK 3-0 18XBRD TIE BLK (SUTURE) ×1 IMPLANT
SUT SILK 4 0 TIES 17X18 (SUTURE) ×2 IMPLANT
SUT VIC AB 3-0 FS2 27 (SUTURE) ×2 IMPLANT
SUT VICRYL 4-0 PS2 18IN ABS (SUTURE) ×2 IMPLANT
SYR BULB 3OZ (MISCELLANEOUS) ×2 IMPLANT
SYR CONTROL 10ML LL (SYRINGE) ×2 IMPLANT
TOWEL OR 17X24 6PK STRL BLUE (TOWEL DISPOSABLE) ×2 IMPLANT
TRAY DSU PREP LF (CUSTOM PROCEDURE TRAY) ×2 IMPLANT
TRAY FOLEY CATH 14FR (SET/KITS/TRAYS/PACK) IMPLANT
TUBE CONNECTING 20X1/4 (TUBING) ×2 IMPLANT
WATER STERILE IRR 1000ML POUR (IV SOLUTION) IMPLANT

## 2011-12-17 NOTE — H&P (Signed)
H&P Update  Pt's original H&P dated 11/21/11 reviewed and placed in chart (to be scanned).  I personally examined the patient today.  No change in health. Proceed with right parotidectomy.

## 2011-12-17 NOTE — Anesthesia Procedure Notes (Signed)
Procedure Name: Intubation Date/Time: 12/17/2011 8:48 AM Performed by: Radford Pax Pre-anesthesia Checklist: Patient identified, Emergency Drugs available, Suction available, Patient being monitored and Timeout performed Patient Re-evaluated:Patient Re-evaluated prior to inductionOxygen Delivery Method: Circle System Utilized Preoxygenation: Pre-oxygenation with 100% oxygen Intubation Type: IV induction Ventilation: Mask ventilation without difficulty Laryngoscope Size: Miller and 2 Grade View: Grade I Tube type: Oral (Dr Jean Rosenthal moved tube to left side of mouth) Tube size: 7.0 mm Number of attempts: 1 (atraumatic) Airway Equipment and Method: stylet and oral airway Placement Confirmation: ETT inserted through vocal cords under direct vision,  positive ETCO2 and breath sounds checked- equal and bilateral Secured at: 21 cm Tube secured with: Tape (pink tape used) Dental Injury: Teeth and Oropharynx as per pre-operative assessment

## 2011-12-17 NOTE — Anesthesia Postprocedure Evaluation (Signed)
  Anesthesia Post-op Note  Patient: Carla Knight  Procedure(s) Performed:  PAROTIDECTOMY - Total right parotidectomy  Patient Location: PACU  Anesthesia Type: General  Level of Consciousness: awake, alert  and oriented  Airway and Oxygen Therapy: Patient Spontanous Breathing and Patient connected to nasal cannula oxygen  Post-op Pain: none  Post-op Assessment: Post-op Vital signs reviewed, Patient's Cardiovascular Status Stable, Respiratory Function Stable, Patent Airway, No signs of Nausea or vomiting and Pain level controlled  Post-op Vital Signs: Reviewed and stable  Complications: No apparent anesthesia complications

## 2011-12-17 NOTE — Anesthesia Preprocedure Evaluation (Addendum)
Anesthesia Evaluation  Patient identified by MRN, date of birth, ID band Patient awake    Reviewed: Allergy & Precautions, H&P , NPO status , Patient's Chart, lab work & pertinent test results  History of Anesthesia Complications Negative for: history of anesthetic complications (but patient requests scopolamine patch)  Airway Mallampati: I TM Distance: >3 FB Neck ROM: Full    Dental No notable dental hx. (+) Teeth Intact and Dental Advisory Given   Pulmonary neg pulmonary ROS,  clear to auscultation  Pulmonary exam normal       Cardiovascular hypertension (took lisinopril today), Pt. on medications Regular Normal    Neuro/Psych Negative Neurological ROS  Negative Psych ROS   GI/Hepatic negative GI ROS, Neg liver ROS,   Endo/Other  Diabetes mellitus- (glu 151 this am), Well Controlled, Type 2, Oral Hypoglycemic AgentsMorbid obesity  Renal/GU negative Renal ROS     Musculoskeletal   Abdominal (+) obese,   Peds  Hematology negative hematology ROS (+)   Anesthesia Other Findings   Reproductive/Obstetrics                          Anesthesia Physical Anesthesia Plan  ASA: II  Anesthesia Plan: General   Post-op Pain Management:    Induction: Intravenous  Airway Management Planned: Oral ETT  Additional Equipment:   Intra-op Plan:   Post-operative Plan: Extubation in OR  Informed Consent: I have reviewed the patients History and Physical, chart, labs and discussed the procedure including the risks, benefits and alternatives for the proposed anesthesia with the patient or authorized representative who has indicated his/her understanding and acceptance.   Dental advisory given  Plan Discussed with: Surgeon and CRNA  Anesthesia Plan Comments: (Plan routine monitors, GETA)        Anesthesia Quick Evaluation

## 2011-12-17 NOTE — Brief Op Note (Signed)
12/17/2011  6:02 PM  PATIENT:  Carla Knight  36 y.o. female  PRE-OPERATIVE DIAGNOSIS:  right parotid mass  POST-OPERATIVE DIAGNOSIS:  right parotid mass  PROCEDURE:  Procedure(s): Right total PAROTIDECTOMY with facial nerve dissection  SURGEON:  Surgeon(s): Darletta Moll, MD  PHYSICIAN ASSISTANT:   ASSISTANTS: Lincoln Maxin, RN   ANESTHESIA:   general  EBL:  Total I/O In: 3320 [P.O.:220; I.V.:3100] Out: 1000 [Urine:1000]  BLOOD ADMINISTERED:none  DRAINS: (#7) Jackson-Pratt drain(s) with closed bulb suction in the neck   LOCAL MEDICATIONS USED:  LIDOCAINE 5CC  SPECIMEN:  Source of Specimen:  Right parotid mass  DISPOSITION OF SPECIMEN:  PATHOLOGY  COUNTS:  YES  TOURNIQUET:  * No tourniquets in log *  DICTATION: .Other Dictation: Dictation Number [id number]  PLAN OF CARE: Admit for overnight observation  PATIENT DISPOSITION:  PACU - hemodynamically stable.   Delay start of Pharmacological VTE agent (>24hrs) due to surgical blood loss or risk of bleeding:  not applicable

## 2011-12-17 NOTE — Transfer of Care (Signed)
Immediate Anesthesia Transfer of Care Note  Patient: Carla Knight  Procedure(s) Performed:  PAROTIDECTOMY - Total right parotidectomy  Patient Location: PACU  Anesthesia Type: General  Level of Consciousness: sedated  Airway & Oxygen Therapy: Patient Spontanous Breathing and Patient connected to face mask oxygen  Post-op Assessment: Report given to PACU RN and Post -op Vital signs reviewed and stable  Post vital signs: Reviewed and stable  Complications: No apparent anesthesia complications

## 2011-12-18 ENCOUNTER — Encounter (HOSPITAL_BASED_OUTPATIENT_CLINIC_OR_DEPARTMENT_OTHER): Payer: Self-pay | Admitting: Otolaryngology

## 2011-12-18 LAB — GLUCOSE, CAPILLARY: Glucose-Capillary: 134 mg/dL — ABNORMAL HIGH (ref 70–99)

## 2011-12-18 NOTE — Discharge Summary (Signed)
Physician Discharge Summary  Patient ID: Carla Knight MRN: [id number] DOB/AGE: 1976/05/19 35 y.o.  Admit date: 12/17/2011 Discharge date: 12/18/2011  Admission Diagnoses: Right parotid mass  Discharge Diagnoses: same Active Problems:  * No active hospital problems. *    Discharged Condition: good  Hospital Course: Tolerated po well. Minimal JP output. Incision C/D/I. Facial nerve intact bilaterally.  Consults: None  Significant Diagnostic Studies: None  Treatments: surgery: Right deep lobe total parotidectomy.  Discharge Exam: Blood pressure 117/79, pulse 100, temperature 98 F (36.7 C), temperature source Oral, resp. rate 18, height 5\' 3"  (1.6 m), weight 83.915 kg (185 lb), last menstrual period 12/06/2011, SpO2 95.00%. Right parotid incision C/D/I. CN 7 intact bilaterally.  Disposition: Home or Self Care   Medication List  As of 12/18/2011  7:43 AM   ASK your doctor about these medications         hydrochlorothiazide 25 MG tablet   Commonly known as: HYDRODIURIL   Take 25 mg by mouth daily.      lisinopril 10 MG tablet   Commonly known as: PRINIVIL,ZESTRIL   Take 10 mg by mouth daily. AM      metFORMIN 500 MG tablet   Commonly known as: GLUCOPHAGE   Take 500 mg by mouth 2 (two) times daily with a meal. 500 MG. AM, 1000 MG. PM             Signed: Darletta Moll 12/18/2011, 7:43 AM

## 2011-12-18 NOTE — Op Note (Signed)
NAME:  Carla Knight, Carla Knight                  ACCOUNT NO.:  MEDICAL RECORD NO.:  [id number]  LOCATION:                                 FACILITY:  PHYSICIAN:  Newman Pies, MD            DATE OF BIRTH:  1976/02/14  DATE OF PROCEDURE:  12/17/2011 DATE OF DISCHARGE:                              OPERATIVE REPORT   SURGEON:  Newman Pies, MD.  PREOPERATIVE DIAGNOSIS:  Right parotid mass.  POSTOPERATIVE DIAGNOSIS:  Right parotid mass, with deep lobe extension below the right facial nerve.  PROCEDURE PERFORMED:  Right total parotidectomy with dissection and preservation of the right facial nerve.  ANESTHESIA:  General endotracheal tube anesthesia.  COMPLICATIONS:  None.  ESTIMATED BLOOD LOSS:  Less than 50 mL.  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  She was treated with multiple courses of antibiotics. She underwent neck CT scan recently, which showed large right parotid mass.  The parotid mass was noted to be approximately 3 cm in size, and involved the deep right parotid lobe.  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  Based on the above findings, the decision was made for the patient to undergo excision of the right parotid mass.  The risks, benefits, alternatives, and details of the procedure were discussed with the patient and her husband. Questions were invited and answered.  Informed consent was obtained.  DESCRIPTION OF PROCEDURE:  The patient was taken to the operating room and placed supine on the operating table.  General endotracheal tube anesthesia was administered by the anesthesiologist.  Preop IV antibiotics was given.  The patient was positioned, and prepped and draped in a standard fashion for right parotid surgery.  Facial nerve electrodes were placed, and it was noted to be functional throughout the case.  Lidocaine 1% with  1:100,000 epinephrine was injected at the planned site of incisions.  Curvilinear lazy S-shaped incision was made along the preauricular crease, down the right lateral neck.  The incision was carried down to the subcutaneous level.  The skin flap that overlying the right parotid gland was carefully elevated in a standard fashion.  The right great auricular nerve was identified and ligated, in order to provide access to the parotid tumor.  Careful blunt dissection was carried out along the right preauricular cartilage, separating the auricular cartilage from the parotid tissue.  The cartilaginous pointer was identified.  Approximately, 1 cm medial and inferior to the cartilaginous pointer, was the main trunk of the facial nerve.  The facial nerve was carefully identified and dissected free from the surrounding parotid gland.  It should be noted that the parotid tumor was immediately adjacent and abutting the main trunk of the facial nerve.  The superior and inferior branches of the facial nerves were carefully dissected free.  At this time, it was noted that the tumor extends significantly deeper than the facial nerve.  The facial nerve was noted to loop around the tumor, situating inferior and anterior to the 3 cm mass.  It appears  that the presence of the tumor has stretched and displaced the facial nerve inferiorly.  Dissection along the facial nerve was carried out with the use of facial nerve dissector and bipolar scissors.  Hemostasis was also achieved along the way with bipolar cautery and suture ligature.  All 5 branches of the facial nerve were identified and preserved.  It should be noted that all branches of the facial nerve were functional throughout the case.  The entire parotid mass was then carefully dissected from the facial nerve, and as well as the adjacent parotid tissue.  On the deep aspect of the mass, the facial nerve was noted to be compressing a large vein, likely  the retrofacial vein.  The tumor was dissected free from the vein without complication. The entire tumor was then sent en bloc to the pathology department for permanent histologic identification.  The surgical site was copiously irrigated.  A #7-JP drain was placed.  The incision was closed in layers with 4-0 Vicryl sutures and Dermabond.  That concluded procedure for the patient.  The care of the patient was turned over to the anesthesiologist.  The patient was awakened from anesthesia without difficulty.  She was extubated and transferred to the recovery room in good condition.  OPERATIVE FINDINGS:  A large 3 cm right parotid mass was noted.  The parotid mass was noted to extend deep below the facial nerve.  The mass was noted to stretch and displaced the facial nerve inferiorly.  The entire facial nerve and its branches were identified and preserved.  SPECIMEN:  Right parotid mass.  FOLLOWUP CARE:  The patient will be observed overnight.  She will most likely be discharged home on postop day #1.  The patient will be placed on Keflex 500 mg p.o. q.i.d. for 5 days, and Percocet 1-2 tablets p.o. q.4-6 hours p.r.n. pain.  The patient will follow up in my office in approximately 1 week.     Newman Pies, MD     ST/MEDQ  D:  12/17/2011  T:  12/18/2011  Job:  [id number]  cc:   Kingsley Callander. Ouida Sills, MD

## 2011-12-26 ENCOUNTER — Ambulatory Visit (INDEPENDENT_AMBULATORY_CARE_PROVIDER_SITE_OTHER): Payer: BC Managed Care – PPO | Admitting: Otolaryngology

## 2012-01-30 ENCOUNTER — Ambulatory Visit (INDEPENDENT_AMBULATORY_CARE_PROVIDER_SITE_OTHER): Payer: BC Managed Care – PPO | Admitting: Otolaryngology

## 2012-01-30 DIAGNOSIS — D449 Neoplasm of uncertain behavior of unspecified endocrine gland: Secondary | ICD-10-CM

## 2012-01-30 DIAGNOSIS — E0520 Thyrotoxicosis with toxic multinodular goiter without thyrotoxic crisis or storm: Secondary | ICD-10-CM

## 2012-03-16 ENCOUNTER — Other Ambulatory Visit: Payer: Self-pay | Admitting: Otolaryngology

## 2012-04-15 ENCOUNTER — Other Ambulatory Visit: Payer: Self-pay | Admitting: Otolaryngology

## 2012-04-28 ENCOUNTER — Encounter (HOSPITAL_COMMUNITY): Payer: Self-pay | Admitting: Pharmacy Technician

## 2012-05-01 ENCOUNTER — Inpatient Hospital Stay (HOSPITAL_COMMUNITY): Admission: RE | Admit: 2012-05-01 | Discharge: 2012-05-01 | Payer: BC Managed Care – PPO | Source: Ambulatory Visit

## 2012-05-01 ENCOUNTER — Encounter (HOSPITAL_COMMUNITY): Payer: Self-pay

## 2012-05-01 HISTORY — DX: Disorder of thyroid, unspecified: E07.9

## 2012-05-01 HISTORY — DX: Other specified postprocedural states: Z98.890

## 2012-05-01 HISTORY — DX: Other specified postprocedural states: R11.2

## 2012-05-06 ENCOUNTER — Encounter (HOSPITAL_COMMUNITY): Payer: Self-pay

## 2012-05-06 ENCOUNTER — Ambulatory Visit (HOSPITAL_COMMUNITY)
Admission: RE | Admit: 2012-05-06 | Discharge: 2012-05-06 | Disposition: A | Discharge status: Home / Self Care | Payer: BC Managed Care – PPO | Source: Ambulatory Visit | Attending: Otolaryngology | Admitting: Otolaryngology

## 2012-05-06 ENCOUNTER — Encounter (HOSPITAL_COMMUNITY)
Admission: RE | Admit: 2012-05-06 | Discharge: 2012-05-06 | Disposition: A | Discharge status: Home / Self Care | Payer: BC Managed Care – PPO | Source: Ambulatory Visit | Attending: Otolaryngology | Admitting: Otolaryngology

## 2012-05-06 DIAGNOSIS — Z01812 Encounter for preprocedural laboratory examination: Secondary | ICD-10-CM | POA: Insufficient documentation

## 2012-05-06 DIAGNOSIS — Z01818 Encounter for other preprocedural examination: Secondary | ICD-10-CM | POA: Insufficient documentation

## 2012-05-06 LAB — BASIC METABOLIC PANEL
BUN: 9 mg/dL (ref 6–23)
Creatinine, Ser: 0.57 mg/dL (ref 0.50–1.10)
GFR calc Af Amer: >90 mL/min (ref >90)
GFR calc non Af Amer: >90 mL/min (ref >90)
Glucose, Bld: 162 mg/dL — ABNORMAL HIGH (ref 70–99)
Potassium: 3.6 mEq/L (ref 3.5–5.1)

## 2012-05-06 LAB — CBC
HCT: 40.8 % (ref 36.0–46.0)
Hemoglobin: 13.9 g/dL (ref 12.0–15.0)
MCH: 27.9 pg (ref 26.0–34.0)
MCHC: 34.1 g/dL (ref 30.0–36.0)
RDW: 13.1 % (ref 11.5–15.5)

## 2012-05-06 LAB — HCG, SERUM, QUALITATIVE: Preg, Serum: NEGATIVE

## 2012-05-06 LAB — SURGICAL PCR SCREEN: MRSA, PCR: NEGATIVE

## 2012-05-06 NOTE — Pre-Procedure Instructions (Addendum)
20 Carla Knight  05/06/2012   Your procedure is scheduled on:  *6.26.13  Report to [facility] at 630 AM.  Call this number if you have problems the morning of surgery: [phone]   Remember:   Do not eat foodor drink:After Midnight.    Take these medicines the morning of surgery with A SIP OF WATER none  STOP ibuprofen   Do not wear jewelry, make-up or nail polish.  Do not wear lotions, powders, or perfumes. You may wear deodorant.  Do not shave 48 hours prior to surgery. Men may shave face and neck.  Do not bring valuables to the hospital.  Contacts, dentures or bridgework may not be worn into surgery.  Leave suitcase in the car. After surgery it may be brought to your room.  For patients admitted to the hospital, checkout time is 11:00 AM the day of discharge.   Patients discharged the day of surgery will not be allowed to drive home.  Name and phone number of your driver: brian  [phone]  Special Instructions: CHG Shower Use Special Wash: 1/2 bottle night before surgery and 1/2 bottle morning of surgery.   Please read over the following fact sheets that you were given: Pain Booklet, Coughing and Deep Breathing, MRSA Information and Surgical Site Infection Prevention

## 2012-05-13 ENCOUNTER — Observation Stay (HOSPITAL_COMMUNITY)
Admission: RE | Admit: 2012-05-13 | Discharge: 2012-05-15 | DRG: 290 | Disposition: A | Discharge status: Home / Self Care | Payer: BC Managed Care – PPO | Source: Ambulatory Visit | Attending: Otolaryngology | Admitting: Otolaryngology

## 2012-05-13 ENCOUNTER — Encounter (HOSPITAL_COMMUNITY): Payer: Self-pay | Admitting: Anesthesiology

## 2012-05-13 ENCOUNTER — Encounter (HOSPITAL_COMMUNITY)
Admission: RE | Disposition: A | Discharge status: Home / Self Care | Payer: Self-pay | Source: Ambulatory Visit | Attending: Otolaryngology

## 2012-05-13 ENCOUNTER — Ambulatory Visit (HOSPITAL_COMMUNITY): Payer: BC Managed Care – PPO | Admitting: Anesthesiology

## 2012-05-13 ENCOUNTER — Encounter (HOSPITAL_COMMUNITY): Payer: Self-pay | Admitting: *Deleted

## 2012-05-13 DIAGNOSIS — E119 Type 2 diabetes mellitus without complications: Secondary | ICD-10-CM | POA: Insufficient documentation

## 2012-05-13 DIAGNOSIS — E0520 Thyrotoxicosis with toxic multinodular goiter without thyrotoxic crisis or storm: Secondary | ICD-10-CM | POA: Insufficient documentation

## 2012-05-13 DIAGNOSIS — E890 Postprocedural hypothyroidism: Secondary | ICD-10-CM

## 2012-05-13 DIAGNOSIS — C73 Malignant neoplasm of thyroid gland: Principal | ICD-10-CM | POA: Insufficient documentation

## 2012-05-13 DIAGNOSIS — I10 Essential (primary) hypertension: Secondary | ICD-10-CM | POA: Insufficient documentation

## 2012-05-13 HISTORY — PX: THYROIDECTOMY: SHX17

## 2012-05-13 LAB — CALCIUM
Calcium: 8.5 mg/dL (ref 8.4–10.5)
Calcium: 8.4 mg/dL (ref 8.4–10.5)

## 2012-05-13 LAB — GLUCOSE, CAPILLARY: Glucose-Capillary: 155 mg/dL — ABNORMAL HIGH (ref 70–99)

## 2012-05-13 SURGERY — THYROIDECTOMY
Anesthesia: General | Site: Neck | Laterality: Bilateral | Wound class: Clean

## 2012-05-13 MED ORDER — LABETALOL HCL 5 MG/ML IV SOLN
INTRAVENOUS | Status: DC | PRN
  Administered 2012-05-13 (×2): 5 mg via INTRAVENOUS

## 2012-05-13 MED ORDER — METFORMIN HCL 500 MG PO TABS
500.0000 mg | ORAL_TABLET | Freq: Two times a day (BID) | ORAL | Status: DC
  Administered 2012-05-13: 500 mg via ORAL
  Administered 2012-05-14 – 2012-05-15 (×3): 1000 mg via ORAL
  Filled 2012-05-13 (×6): qty 2

## 2012-05-13 MED ORDER — DROPERIDOL 2.5 MG/ML IJ SOLN
INTRAMUSCULAR | Status: DC | PRN
  Administered 2012-05-13: .625 mg via INTRAVENOUS

## 2012-05-13 MED ORDER — PROPOFOL 10 MG/ML IV EMUL
INTRAVENOUS | Status: DC | PRN
  Administered 2012-05-13: 190 mg via INTRAVENOUS

## 2012-05-13 MED ORDER — LABETALOL HCL 5 MG/ML IV SOLN
INTRAVENOUS | Status: AC
  Filled 2012-05-13: qty 4

## 2012-05-13 MED ORDER — CEFAZOLIN SODIUM 1-5 GM-% IV SOLN
1.0000 g | Freq: Three times a day (TID) | INTRAVENOUS | Status: AC
  Administered 2012-05-13 – 2012-05-14 (×4): 1 g via INTRAVENOUS
  Filled 2012-05-13 (×4): qty 50

## 2012-05-13 MED ORDER — PROMETHAZINE HCL 25 MG PO TABS
25.0000 mg | ORAL_TABLET | Freq: Four times a day (QID) | ORAL | Status: DC | PRN
  Administered 2012-05-13: 25 mg via ORAL
  Filled 2012-05-13: qty 1

## 2012-05-13 MED ORDER — HYDROMORPHONE HCL PF 1 MG/ML IJ SOLN
0.2500 mg | INTRAMUSCULAR | Status: DC | PRN
  Administered 2012-05-13: 0.5 mg via INTRAVENOUS

## 2012-05-13 MED ORDER — ONDANSETRON HCL 4 MG/2ML IJ SOLN: 4.0000 mg | Freq: Four times a day (QID) | INTRAMUSCULAR | Status: DC | PRN

## 2012-05-13 MED ORDER — LIDOCAINE HCL (CARDIAC) 20 MG/ML IV SOLN
INTRAVENOUS | Status: DC | PRN
  Administered 2012-05-13: 70 mg via INTRAVENOUS

## 2012-05-13 MED ORDER — MORPHINE SULFATE 2 MG/ML IJ SOLN
2.0000 mg | INTRAMUSCULAR | Status: DC | PRN
  Administered 2012-05-13: 2 mg via INTRAVENOUS
  Filled 2012-05-13: qty 1

## 2012-05-13 MED ORDER — FENTANYL CITRATE 0.05 MG/ML IJ SOLN
INTRAMUSCULAR | Status: DC | PRN
  Administered 2012-05-13: 50 ug via INTRAVENOUS
  Administered 2012-05-13: 100 ug via INTRAVENOUS
  Administered 2012-05-13 (×5): 50 ug via INTRAVENOUS

## 2012-05-13 MED ORDER — OXYCODONE-ACETAMINOPHEN 5-325 MG PO TABS
1.0000 | ORAL_TABLET | ORAL | Status: DC | PRN
  Administered 2012-05-13 – 2012-05-14 (×4): 2 via ORAL
  Filled 2012-05-13 (×4): qty 2

## 2012-05-13 MED ORDER — CEFAZOLIN SODIUM 1-5 GM-% IV SOLN
INTRAVENOUS | Status: AC
  Filled 2012-05-13: qty 100

## 2012-05-13 MED ORDER — LACTATED RINGERS IV SOLN
INTRAVENOUS | Status: DC | PRN
  Administered 2012-05-13 (×2): via INTRAVENOUS

## 2012-05-13 MED ORDER — LIDOCAINE-EPINEPHRINE 1 %-1:100000 IJ SOLN
INTRAMUSCULAR | Status: DC | PRN
  Administered 2012-05-13: 20 mL

## 2012-05-13 MED ORDER — ZOLPIDEM TARTRATE 5 MG PO TABS: 5.0000 mg | ORAL_TABLET | Freq: Every evening | ORAL | Status: DC | PRN

## 2012-05-13 MED ORDER — KCL IN DEXTROSE-NACL 20-5-0.45 MEQ/L-%-% IV SOLN
INTRAVENOUS | Status: AC
  Filled 2012-05-13: qty 1000

## 2012-05-13 MED ORDER — 0.9 % SODIUM CHLORIDE (POUR BTL) OPTIME
TOPICAL | Status: DC | PRN
  Administered 2012-05-13: 1000 mL

## 2012-05-13 MED ORDER — CEFAZOLIN SODIUM 1-5 GM-% IV SOLN
INTRAVENOUS | Status: DC | PRN
  Administered 2012-05-13: 2 g via INTRAVENOUS

## 2012-05-13 MED ORDER — ACETAMINOPHEN 650 MG RE SUPP: 650.0000 mg | RECTAL | Status: DC | PRN

## 2012-05-13 MED ORDER — LISINOPRIL 10 MG PO TABS
10.0000 mg | ORAL_TABLET | Freq: Every morning | ORAL | Status: DC
  Administered 2012-05-13 – 2012-05-15 (×3): 10 mg via ORAL
  Filled 2012-05-13 (×3): qty 1

## 2012-05-13 MED ORDER — ONDANSETRON HCL 4 MG/2ML IJ SOLN
INTRAMUSCULAR | Status: DC | PRN
  Administered 2012-05-13: 4 mg via INTRAVENOUS

## 2012-05-13 MED ORDER — HYDROMORPHONE HCL PF 1 MG/ML IJ SOLN
INTRAMUSCULAR | Status: AC
  Filled 2012-05-13: qty 1

## 2012-05-13 MED ORDER — KCL IN DEXTROSE-NACL 20-5-0.45 MEQ/L-%-% IV SOLN
INTRAVENOUS | Status: DC
  Administered 2012-05-13 – 2012-05-14 (×3): via INTRAVENOUS
  Filled 2012-05-13 (×8): qty 1000

## 2012-05-13 MED ORDER — LABETALOL HCL 5 MG/ML IV SOLN
10.0000 mg | Freq: Once | INTRAVENOUS | Status: AC
  Administered 2012-05-13: 10 mg via INTRAVENOUS

## 2012-05-13 MED ORDER — PROMETHAZINE HCL 25 MG RE SUPP: 25.0000 mg | Freq: Four times a day (QID) | RECTAL | Status: DC | PRN

## 2012-05-13 MED ORDER — MIDAZOLAM HCL 5 MG/5ML IJ SOLN
INTRAMUSCULAR | Status: DC | PRN
  Administered 2012-05-13: 2 mg via INTRAVENOUS

## 2012-05-13 MED ORDER — SUCCINYLCHOLINE CHLORIDE 20 MG/ML IJ SOLN
INTRAMUSCULAR | Status: DC | PRN
  Administered 2012-05-13: 120 mg via INTRAVENOUS

## 2012-05-13 MED ORDER — HYDROCHLOROTHIAZIDE 25 MG PO TABS
25.0000 mg | ORAL_TABLET | Freq: Every morning | ORAL | Status: DC
  Administered 2012-05-13 – 2012-05-15 (×3): 25 mg via ORAL
  Filled 2012-05-13 (×3): qty 1

## 2012-05-13 MED ORDER — ACETAMINOPHEN 160 MG/5ML PO SOLN: 650.0000 mg | ORAL | Status: DC | PRN

## 2012-05-13 SURGICAL SUPPLY — 51 items
ATTRACTOMAT 16X20 MAGNETIC DRP (DRAPES) IMPLANT
BLADE SURG 15 STRL LF DISP TIS (BLADE) IMPLANT
BLADE SURG 15 STRL SS (BLADE)
BLADE SURG CLIPPER 3M 9600 (MISCELLANEOUS) IMPLANT
CANISTER SUCTION 2500CC (MISCELLANEOUS) ×2 IMPLANT
CLEANER TIP ELECTROSURG 2X2 (MISCELLANEOUS) ×2 IMPLANT
CLIP TI WIDE RED SMALL 24 (CLIP) IMPLANT
CLOTH BEACON ORANGE TIMEOUT ST (SAFETY) ×2 IMPLANT
CONT SPEC 4OZ CLIKSEAL STRL BL (MISCELLANEOUS) IMPLANT
CORDS BIPOLAR (ELECTRODE) ×2 IMPLANT
COVER SURGICAL LIGHT HANDLE (MISCELLANEOUS) ×2 IMPLANT
DECANTER SPIKE VIAL GLASS SM (MISCELLANEOUS) ×2 IMPLANT
DERMABOND ADVANCED (GAUZE/BANDAGES/DRESSINGS)
DERMABOND ADVANCED .7 DNX12 (GAUZE/BANDAGES/DRESSINGS) IMPLANT
DRAIN CHANNEL 10F 3/8 F FF (DRAIN) ×2 IMPLANT
DRAIN CHANNEL 7F 3/4 FLAT (WOUND CARE) IMPLANT
DRAIN SNY 10X20 3/4 PERF (WOUND CARE) IMPLANT
ELECT COATED BLADE 2.86 ST (ELECTRODE) ×2 IMPLANT
ELECT REM PT RETURN 9FT ADLT (ELECTROSURGICAL) ×2
ELECTRODE REM PT RTRN 9FT ADLT (ELECTROSURGICAL) ×1 IMPLANT
EVACUATOR SILICONE 100CC (DRAIN) ×2 IMPLANT
GAUZE SPONGE 4X4 16PLY XRAY LF (GAUZE/BANDAGES/DRESSINGS) ×6 IMPLANT
GLOVE BIOGEL PI IND STRL 7.0 (GLOVE) ×3 IMPLANT
GLOVE BIOGEL PI INDICATOR 7.0 (GLOVE) ×3
GLOVE ECLIPSE 7.5 STRL STRAW (GLOVE) ×2 IMPLANT
GLOVE ECLIPSE 8.0 STRL XLNG CF (GLOVE) ×2 IMPLANT
GLOVE SS BIOGEL STRL SZ 6.5 (GLOVE) ×1 IMPLANT
GLOVE SUPERSENSE BIOGEL SZ 6.5 (GLOVE) ×1
GLOVE SURG SS PI 6.5 STRL IVOR (GLOVE) ×2 IMPLANT
GOWN STRL NON-REIN LRG LVL3 (GOWN DISPOSABLE) ×6 IMPLANT
KIT BASIN OR (CUSTOM PROCEDURE TRAY) ×2 IMPLANT
KIT ROOM TURNOVER OR (KITS) ×2 IMPLANT
LOCATOR NERVE 3 VOLT (DISPOSABLE) ×2 IMPLANT
NS IRRIG 1000ML POUR BTL (IV SOLUTION) ×2 IMPLANT
PAD ARMBOARD 7.5X6 YLW CONV (MISCELLANEOUS) ×4 IMPLANT
PENCIL BUTTON HOLSTER BLD 10FT (ELECTRODE) ×2 IMPLANT
SHEARS HARMONIC 9CM CVD (BLADE) ×2 IMPLANT
SPECIMEN JAR SMALL (MISCELLANEOUS) ×2 IMPLANT
SPONGE INTESTINAL PEANUT (DISPOSABLE) ×2 IMPLANT
STAPLER VISISTAT 35W (STAPLE) ×2 IMPLANT
SUT ETHILON 2 0 FS 18 (SUTURE) ×2 IMPLANT
SUT SILK 2 0 FS (SUTURE) ×2 IMPLANT
SUT SILK 2 0 REEL (SUTURE) IMPLANT
SUT SILK 3 0 REEL (SUTURE) ×2 IMPLANT
SUT VICRYL 4 0 EN S 48 D7796 (SUTURE) IMPLANT
SUT VICRYL 4-0 PS2 18IN ABS (SUTURE) ×2 IMPLANT
TOWEL OR 17X24 6PK STRL BLUE (TOWEL DISPOSABLE) ×2 IMPLANT
TOWEL OR 17X26 10 PK STRL BLUE (TOWEL DISPOSABLE) ×2 IMPLANT
TRAY ENT MC OR (CUSTOM PROCEDURE TRAY) ×2 IMPLANT
TRAY FOLEY CATH 14FRSI W/METER (CATHETERS) ×2 IMPLANT
WATER STERILE IRR 1000ML POUR (IV SOLUTION) IMPLANT

## 2012-05-13 NOTE — H&P (Signed)
Cc: Thyroid goiter  HPI: The patient presents today reporting no difficulty with her partoid surgery site. However, she has numerous concerns regarding her thyroid goiter and hyperthyroidism. I recently spoke with Dr. Casimiro Needle Altheimer, the patient's endocrinologist, regarding the management of her goiter and hyperthyroidism. The plan is to offer the patient the options of conservative observation vs FNA vs. total thyroidectomy. Currently, the patient complains of occasional dysphagia secondary to thyroid compression. No odynophagia, dyphonia or recent weight loss is noted. No other ENT, GI, or respiratory issue noted since the last visit.    The patient's review of systems (constitutional, eyes, ENT, cardiovascular, respiratory, GI, musculoskeletal, skin, neurologic, psychiatric, endocrine, hematologic, allergic) is noted in the ROS questionnaire.  It is reviewed with the patient.    Past Medical History (Major events, hospitalizations, surgeries):  C-Section, Total right parotidectomy.     Known allergies: NKDA.     Ongoing medical problems: Diabetes, Hypertension, goiter.     Family medical history: Diabetes.     Social history: The patient is married. She denies the use of alcohol, tobacco or illegal drugs.  Exam: General: Communicates without difficulty, well nourished, no acute distress.  Head: Normocephalic, no evidence injury, no tenderness, facial buttresses intact without stepoff.  Eyes: PERRL, EOMI. No scleral icterus, conjunctivae clear.  Neuro: CN II exam reveals vision grossly intact. No nystagmus at any point of gaze.  Ears: Auricles well formed without lesions. Ear canals are intact without mass or lesion. No erythema or edema is appreciated. The TMs are intact without fluid. Nose: External evaluation reveals normal support and skin without lesions. Dorsum is intact. Anterior rhinoscopy reveals healthy pink mucosa over anterior aspect of inferior turbinates and intact septum. No  purulence noted. Oral:  Oral cavity and oropharynx are intact, symmetric, without erythema or edema.  Mucosa is moist without lesions. Neck: Full range of motion without pain. There is no significant lymphadenopathy.  Large bilateral thyroid goiters noted. Submandibular glands equal bilaterally without mass. The right parotid incision is healing well without skin erythema or drainage. The left parotid gland is within normal limits. Trachea is midline. Neuro:  CN 2-12 grossly intact. Gait normal.  A: Bilateral thyroid goiter with significant hyperthyroidism and compressive symptoms.  P: 1. The options of continuing conservative observation vs. FNA of the goiter vs. total thyroidectomy are discussed extensively with the patient. The risks, benefits, alternatives, and details of the procedures are also reviewed. Questions are invited and answered.  2. The patient will like to proceed with a total thyroidectomy option. Informed consent is obtained.

## 2012-05-13 NOTE — Transfer of Care (Signed)
Immediate Anesthesia Transfer of Care Note  Patient: Carla Knight  Procedure(s) Performed: Procedure(s) (LRB): THYROIDECTOMY (Bilateral)  Patient Location: PACU  Anesthesia Type: General  Level of Consciousness: awake, alert , oriented and sedated  Airway & Oxygen Therapy: Patient Spontanous Breathing and Patient connected to face mask oxygen  Post-op Assessment: Report given to PACU RN, Post -op Vital signs reviewed and stable and Patient moving all extremities  Post vital signs: Reviewed and stable  Complications: No apparent anesthesia complications

## 2012-05-13 NOTE — Care Management Note (Signed)
  Page 1 of 1   05/13/2012     3:37:32 PM   CARE MANAGEMENT NOTE 05/13/2012  Patient:  Carla Knight, Carla Knight   Account Number:  [id number]  Date Initiated:  05/13/2012  Documentation initiated by:  Ronny Flurry  Subjective/Objective Assessment:   THYROIDECTOMY (Bilateral)     Action/Plan:   Anticipated DC Date:  05/15/2012   Anticipated DC Plan:  HOME/SELF CARE         Choice offered to / List presented to:             Status of service:  In process, will continue to follow Medicare Important Message given?   (If response is "NO", the following Medicare IM given date fields will be blank) Date Medicare IM given:   Date Additional Medicare IM given:    Discharge Disposition:    Per UR Regulation:  Reviewed for med. necessity/level of care/duration of stay  If discussed at Long Length of Stay Meetings, dates discussed:    Comments:

## 2012-05-13 NOTE — Anesthesia Postprocedure Evaluation (Signed)
Anesthesia Post Note  Patient: Carla Knight  Procedure(s) Performed: Procedure(s) (LRB): THYROIDECTOMY (Bilateral)  Anesthesia type: General  Patient location: PACU  Post pain: Pain level controlled and Adequate analgesia  Post assessment: Post-op Vital signs reviewed, Patient's Cardiovascular Status Stable, Respiratory Function Stable, Patent Airway and Pain level controlled  Last Vitals:  Filed Vitals:   05/13/12 1117  BP:   Pulse:   Temp: 36.2 C  Resp:     Post vital signs: Reviewed and stable  Level of consciousness: awake, alert  and oriented  Complications: No apparent anesthesia complications

## 2012-05-13 NOTE — Op Note (Signed)
Carla Knight, Carla Knight             ACCOUNT NO.:  [id number]  MEDICAL RECORD NO.:  [id number]  LOCATION:  5158                         FACILITY:  [facility]  PHYSICIAN:  Newman Pies, MD            DATE OF BIRTH:  [birth year]  DATE OF PROCEDURE:  05/13/2012 DATE OF DISCHARGE:                              OPERATIVE REPORT   SURGEON:  Newman Pies, MD  PREOPERATIVE DIAGNOSES: 1. Enlarging thyroid goiter. 2. Hyperthyroidism.  POSTOPERATIVE DIAGNOSES: 1. Multinodular goiter. 2. Hyperthyroidism.  ANESTHESIA:  General endotracheal tube anesthesia.  COMPLICATIONS:  None.  ESTIMATED BLOOD LOSS:  Minimal.  INDICATION FOR PROCEDURE:  The patient is a 36 year old female with history of enlarging thyroid goiter and hyperthyroidism.  Her thyroid gland was noted to have gradually enlarged over the past several years. Her thyroid lobe was enlarged to the point where it was causing frequent compressive symptoms.  The patient complains frequent dysphagia and globus sensation due to the thyroid compression.  In addition, the patient was noted to have significant tachycardia and elevated blood pressure during her last surgery.  The option of continuing conservative observation with medical management versus total thyroidectomy were discussed with the patient.  The risks, benefits, alternatives, and details of each treatment modalities were discussed at length.  The patient has decided to proceed with a total thyroidectomy procedure. Informed consent was obtained.  DESCRIPTION:  The patient was taken to the operating room and placed supine on the operating table.  General endotracheal tube anesthesia was administered by the anesthesiologist.  Preop IV antibiotic was given. 1% lidocaine with 1:100,000 epinephrine was injected at the planned site of incisions.  Horizontal incision was made over the lower neck overlying the thyroid gland.  The incision was carried down past the level of the platysma muscles.   Superiorly based and inferiorly based subplatysmal flaps were elevated in a standard fashion.  The strap muscles were divided at midline and retracted laterally, exposing the thyroid gland.  A large 5 cm right thyroid nodule was noted.  The nodule nearly replaced the entire right thyroid lobe.  Careful dissection was performed to free the right thyroid lobe from the surrounding soft tissue.  The middle thyroid vein was identified and ligated.  The superior and the inferior right parathyroid glands were identified and preserved.  The recurrent laryngeal nerve was also identified and preserved.  The nerve was noted to be functional when stimulated with the nerve stimulator.  The entire lobe was then resected free from its attachment to the trachea and thyroid cartilage.  The same procedure was then repeated on the left side without exception.  The entire gland was sent to the Pathology Department for permanent histologic identification.  It should be noted that 2 smaller thyroid nodules were also noted on the left side.  The surgical site was copiously irrigated. A #10 JP drain was placed.  The strap muscles were closed with running 4- 0 Vicryl sutures.  The skin incision was closed in layers with 4-0 Vicryl sutures and Dermabond.  That concluded procedure for the patient. The care of the patient was turned over to the anesthesiologist.  The patient was awakened from  anesthesia without difficulty.  She was extubated and transferred to the recovery room in good condition.  OPERATIVE FINDINGS:  A large 5 cm right thyroid nodule was noted.  Two smaller thyroid nodules were also noted on the left side.  The parathyroid glands and recurrent laryngeal nerves were identified and preserved.  SPECIMENS:  Total thyroid specimen.  FOLLOWUP CARE:  The patient will be observed in the hospital.  Her calcium will be checked q.6 hours until it is stable.     Newman Pies, MD     ST/MEDQ  D:   05/13/2012  T:  05/13/2012  Job:  [id number]  cc:   Kingsley Callander. Ouida Sills, MD Veverly Fells. Altheimer, M.D.

## 2012-05-13 NOTE — Anesthesia Preprocedure Evaluation (Signed)
Anesthesia Evaluation  Patient identified by MRN, date of birth, ID band Patient awake    Reviewed: Allergy & Precautions, H&P , NPO status , Patient's Chart, lab work & pertinent test results  History of Anesthesia Complications (+) PONV  Airway Mallampati: II  Neck ROM: full    Dental   Pulmonary          Cardiovascular hypertension,     Neuro/Psych    GI/Hepatic   Endo/Other  Diabetes mellitus-  Renal/GU      Musculoskeletal   Abdominal   Peds  Hematology   Anesthesia Other Findings   Reproductive/Obstetrics                           Anesthesia Physical Anesthesia Plan  ASA: II  Anesthesia Plan: General   Post-op Pain Management:    Induction: Intravenous  Airway Management Planned: Oral ETT  Additional Equipment:   Intra-op Plan:   Post-operative Plan: Extubation in OR  Informed Consent: I have reviewed the patients History and Physical, chart, labs and discussed the procedure including the risks, benefits and alternatives for the proposed anesthesia with the patient or authorized representative who has indicated his/her understanding and acceptance.     Plan Discussed with: CRNA and Surgeon  Anesthesia Plan Comments:         Anesthesia Quick Evaluation

## 2012-05-13 NOTE — Progress Notes (Signed)
Notified Dr.Hodierne of pt continuing tachycardia. Orders received to give Labetalol 10mg  IV now. Will give and continue to monitor.

## 2012-05-13 NOTE — Brief Op Note (Signed)
05/13/2012  11:11 AM  PATIENT:  Carla Knight  36 y.o. female  PRE-OPERATIVE DIAGNOSIS:  BILATERAL THYROID MASS   POST-OPERATIVE DIAGNOSIS:  BILATERAL THYROID MASS   PROCEDURE:  Procedure(s) (LRB): THYROIDECTOMY (Bilateral)  SURGEON:  Surgeon(s) and Role:    * Darletta Moll, MD - Primary  PHYSICIAN ASSISTANT:   ASSISTANTS: Baldwin Crown, RN   ANESTHESIA:   general  EBL:  Total I/O In: 1400 [I.V.:1400] Out: 135 [Urine:135]  BLOOD ADMINISTERED:none  DRAINS: (#10) Jackson-Pratt drain(s) with closed bulb suction in the neck   LOCAL MEDICATIONS USED:  LIDOCAINE 7cc  SPECIMEN:  Excision, total thyroid  DISPOSITION OF SPECIMEN:  PATHOLOGY  COUNTS:  YES  TOURNIQUET:  * No tourniquets in log *  DICTATION: .Other Dictation: Dictation Number [id number]  PLAN OF CARE: Admit to inpatient   PATIENT DISPOSITION:  PACU - hemodynamically stable.   Delay start of Pharmacological VTE agent (>24hrs) due to surgical blood loss or risk of bleeding: no

## 2012-05-13 NOTE — Preoperative (Signed)
Beta Blockers   Reason not to administer Beta Blockers:Not Applicable 

## 2012-05-14 ENCOUNTER — Encounter (HOSPITAL_COMMUNITY): Payer: Self-pay | Admitting: Otolaryngology

## 2012-05-14 LAB — CALCIUM
Calcium: 8.7 mg/dL (ref 8.4–10.5)
Calcium: 9.3 mg/dL (ref 8.4–10.5)

## 2012-05-14 NOTE — Progress Notes (Signed)
Subjective: The patient reports some soreness around her neck.  Tolerated po. No muscle weakness, numbness.  Voice is strong.  Objective: Vital signs in last 24 hours: Temp:  [97.2 F (36.2 C)-98.8 F (37.1 C)] 98.8 F (37.1 C) (06/27 0519) Pulse Rate:  [100-116] 103  (06/27 0519) Resp:  [10-20] 18  (06/27 0519) BP: (108-164)/(66-99) 108/66 mmHg (06/27 0519) SpO2:  [95 %-100 %] 97 % (06/27 0519) Weight:  [85 kg (187 lb 6.3 oz)] 85 kg (187 lb 6.3 oz) (06/26 1700)  Neck incision is clean dry and intact. No significant erythema or edema. JP with a small amount of serosanguineous fluid. The patient's voice is strong and clear.  Ca 8.5 -> 8.4 -> 8.4  Basename 05/14/12 0435 05/13/12 1929  NA -- --  K -- --  CL -- --  CO2 -- --  GLUCOSE -- --  BUN -- --  CREATININE -- --  CALCIUM 8.4 8.4    Medications:  I have reviewed the patient's current medications. Scheduled:   .  ceFAZolin (ANCEF) IV  1 g Intravenous Q8H  . dextrose 5 % and 0.45 % NaCl with KCl 20 mEq/L      . hydrochlorothiazide  25 mg Oral q morning - 10a  . HYDROmorphone      . labetalol      . labetalol  10 mg Intravenous Once  . lisinopril  10 mg Oral q morning - 10a  . metFORMIN  500-1,000 mg Oral BID WC    Assessment/Plan: The patient is doing well 1 day status post total thyroidectomy. The JP drain is removed this morning. Voice is strong. Her calcium level continues to be stable. She will likely be discharged home tomorrow if calcium level continues to be stable or improve.   LOS: 1 day   Tyneisha Hegeman,SUI W 05/14/2012, 10:09 AM

## 2012-05-15 LAB — CALCIUM: Calcium: 8.7 mg/dL (ref 8.4–10.5)

## 2012-05-15 MED ORDER — OXYCODONE-ACETAMINOPHEN 5-325 MG PO TABS: 1.0000 | ORAL_TABLET | ORAL | Status: AC | PRN

## 2012-05-15 MED ORDER — LEVOTHYROXINE SODIUM 100 MCG PO TABS: 100.0000 ug | ORAL_TABLET | Freq: Every day | ORAL | Status: DC

## 2012-05-15 NOTE — Discharge Summary (Signed)
Physician Discharge Summary  Patient ID: JAVONA BERGEVIN MRN: [id number] DOB/AGE: [id number] y.o.  Admit date: 05/13/2012 Discharge date: 05/15/2012  Admission Diagnoses: Thyroid goiter, hyperthyroidism  Discharge Diagnoses: Thyroid goiter, hyperthyroidism Active Problems:  * No active hospital problems. *    Discharged Condition: good  Hospital Course: The patient had an uneventful hospital course. Her pain was well-controlled. The calcium level stabilized on postop day #1 and improved on postop day #2. Her JP drain was removed on postop day #1. Her voice remained strong and clear. She was discharged home in good condition on postop day #2.  Consults: None  Significant Diagnostic Studies: Calcium levels.  Treatments: surgery: Total thyroidectomy  Discharge Exam: Blood pressure 108/61, pulse 92, temperature 98.7 F (37.1 C), temperature source Oral, resp. rate 18, height 5\' 3"  (1.6 m), weight 85 kg (187 lb 6.3 oz), SpO2 99.00%. The neck incision is clean dry and intact. Her voice is strong. No significant neck erythema and edema.  Disposition: 01-Home or Self Care   Medication List  As of 05/15/2012 10:03 AM   ASK your doctor about these medications         hydrochlorothiazide 25 MG tablet   Commonly known as: HYDRODIURIL   Take 25 mg by mouth every morning.      ibuprofen 200 MG tablet   Commonly known as: ADVIL,MOTRIN   Take 200-800 mg by mouth every 8 (eight) hours as needed. For tooth pain.      lisinopril 10 MG tablet   Commonly known as: PRINIVIL,ZESTRIL   Take 10 mg by mouth every morning.      metFORMIN 500 MG tablet   Commonly known as: GLUCOPHAGE   Take 500-1,000 mg by mouth 2 (two) times daily with a meal. Take 500 MG at lunch time and take 1000 MG at night.      tranexamic acid 650 MG Tabs   Commonly known as: LYSTEDA   Take 1,300 mg by mouth See admin instructions. Take three times daily, ONLY on Days 1-5 of menstrual cycle.            Follow-up Information    Follow up with Darletta Moll, MD in 1 week. (as scheduled)    Contact information:   [address], [address] [place] [address] [phone]          Signed: Darletta Moll 05/15/2012, 10:03 AM

## 2012-05-15 NOTE — Discharge Instructions (Signed)
Thyroidectomy Care After Refer to this sheet in the next few weeks. These instructions provide you with general information on caring for yourself after you leave the hospital. Your caregiver also may give you specific instructions. Your treatment has been planned according to the most current medical practices available, but problems sometimes occur. Call your caregiver if you have any problems or questions after your procedure. HOME CARE INSTRUCTIONS   It is normal to be sore for a few weeks following surgery. See your caregiver if your pain seems to be getting worse rather than better.   Only take over-the-counter or prescription medicines for pain, discomfort, or fever as directed by your caregiver. Avoid taking medicines that contain aspirin and ibuprofen because they increase the risk of bleeding.   Shower rather than bathe until instructed otherwise by your caregiver.   Change your bandages (dressings) as directed by your caregiver.   You may resume a normal diet and activities as directed by your caregiver.   Avoid lifting weight greater than 20 lb (9 kg) or participating in heavy exercise or contact sports for 10 days or as instructed by your caregiver.   Make an appointment to see your caregiver for stitch (suture) or staple removal.  SEEK MEDICAL CARE IF:   You have increased bleeding from your wound.   You have redness, swelling, or increasing pain from your wound or in your neck.   There is pus coming from your wound.   You have an oral temperature above 102 F (38.9 C).   There is a bad smell coming from the wound or dressing.   You develop lightheadedness or feel faint.   You develop numbness, tingling, or muscle spasms in your arms, hands, feet, or face.   You have difficulty swallowing.  SEEK IMMEDIATE MEDICAL CARE IF:   You develop a rash.   You have difficulty breathing.   You hear whistling noises that come from your chest.   You develop a cough that  becomes increasingly worse.   You develop any reaction or side effects to medicines given.   There is swelling in your neck.   You develop changes in speech or hoarseness, which is getting worse.  MAKE SURE YOU:   Understand these instructions.   Will watch your condition.   Will get help right away if you are not doing well or get worse.  Document Released: 05/24/2005 Document Revised: 10/24/2011 Document Reviewed: 01/11/2011 [facility] Patient Information 2012 [place], [place].

## 2012-05-15 NOTE — Progress Notes (Signed)
Discharge home.

## 2012-06-28 ENCOUNTER — Other Ambulatory Visit: Payer: Self-pay | Admitting: Endocrinology

## 2012-06-28 DIAGNOSIS — C73 Malignant neoplasm of thyroid gland: Secondary | ICD-10-CM

## 2012-07-22 ENCOUNTER — Encounter (HOSPITAL_COMMUNITY)
Admission: RE | Admit: 2012-07-22 | Discharge: 2012-07-22 | Disposition: A | Discharge status: Home / Self Care | Payer: BC Managed Care – PPO | Source: Ambulatory Visit | Attending: Endocrinology | Admitting: Endocrinology

## 2012-07-22 DIAGNOSIS — C73 Malignant neoplasm of thyroid gland: Secondary | ICD-10-CM | POA: Insufficient documentation

## 2012-07-22 MED ORDER — THYROTROPIN ALFA 1.1 MG IM SOLR
0.9000 mg | INTRAMUSCULAR | Status: DC
  Administered 2012-07-22: 0.9 mg via INTRAMUSCULAR

## 2012-07-23 ENCOUNTER — Ambulatory Visit (HOSPITAL_COMMUNITY): Admission: RE | Admit: 2012-07-23 | Payer: BC Managed Care – PPO | Source: Ambulatory Visit

## 2012-07-23 MED ORDER — THYROTROPIN ALFA 1.1 MG IM SOLR
0.9000 mg | INTRAMUSCULAR | Status: AC
  Administered 2012-07-23: 0.9 mg via INTRAMUSCULAR
  Filled 2012-07-23: qty 0.9

## 2012-07-24 ENCOUNTER — Encounter (HOSPITAL_COMMUNITY)
Admission: RE | Admit: 2012-07-24 | Discharge: 2012-07-24 | Disposition: A | Discharge status: Home / Self Care | Payer: BC Managed Care – PPO | Source: Ambulatory Visit | Attending: Endocrinology | Admitting: Endocrinology

## 2012-07-24 DIAGNOSIS — C73 Malignant neoplasm of thyroid gland: Secondary | ICD-10-CM | POA: Insufficient documentation

## 2012-07-24 MED ORDER — SODIUM IODIDE I 131 CAPSULE
54.8000 | Freq: Once | INTRAVENOUS | Status: AC | PRN
  Administered 2012-07-24: 54.8 via ORAL

## 2012-08-03 ENCOUNTER — Encounter (HOSPITAL_COMMUNITY)
Admission: RE | Admit: 2012-08-03 | Discharge: 2012-08-03 | Disposition: A | Discharge status: Home / Self Care | Payer: BC Managed Care – PPO | Source: Ambulatory Visit | Attending: Endocrinology | Admitting: Endocrinology

## 2012-08-03 DIAGNOSIS — C73 Malignant neoplasm of thyroid gland: Secondary | ICD-10-CM

## 2013-12-03 IMAGING — CT CT NECK W/ CM
3 of 4 series · 13 of 33 positions shown, 16 images · IV contrast (Omnipaque 300)
Comparison: 08/24/2007

CLINICAL DATA: Evaluate for right parotid mass.

CT NECK WITH CONTRAST
TECHNIQUE: Multidetector CT imaging of the neck was performed with
intravenous contrast.
Contrast: 100mL OMNIPAQUE IOHEXOL 300 MG/ML IV SOLN

[Series 2: soft tissue neck 2.0 b31s · axial · 0.41mm/px · z∈[+20,+174]mm · 5 of 117 slices shown, 7 images]
[im 20/117  soft-tissue]
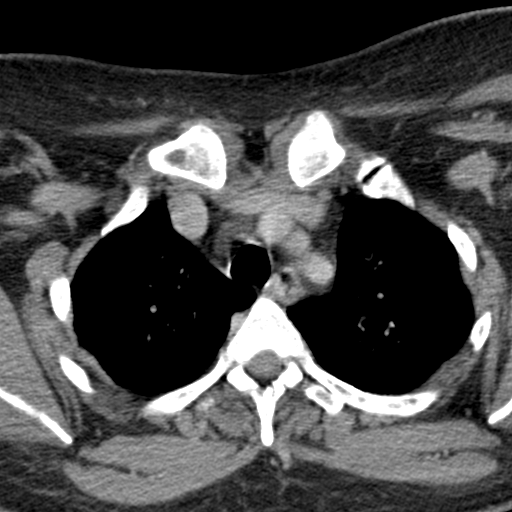
[im 20/117  bone]
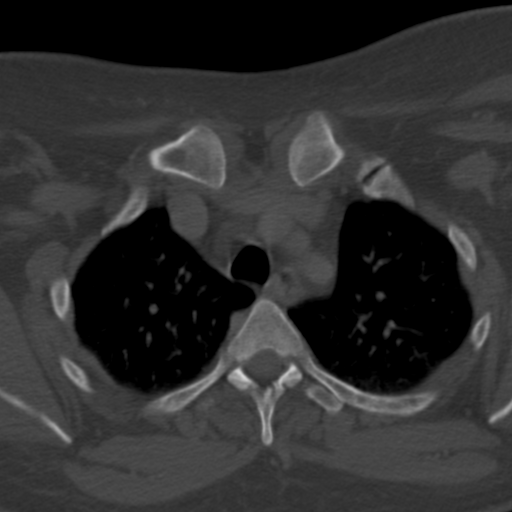
[im 39/117  bone]
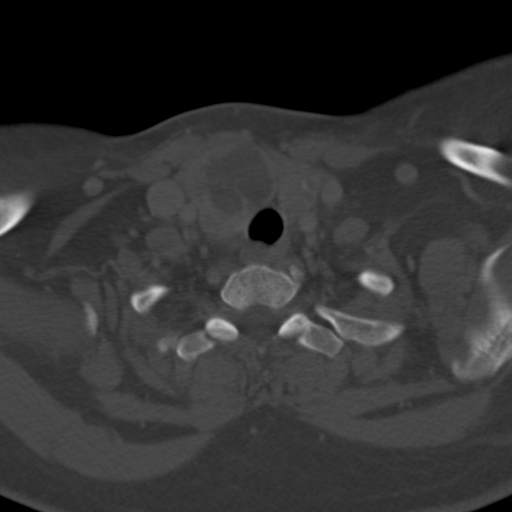
[im 59/117  bone]
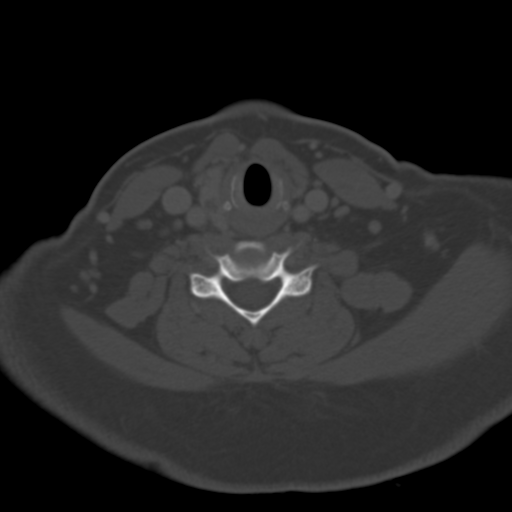
[im 78/117  bone]
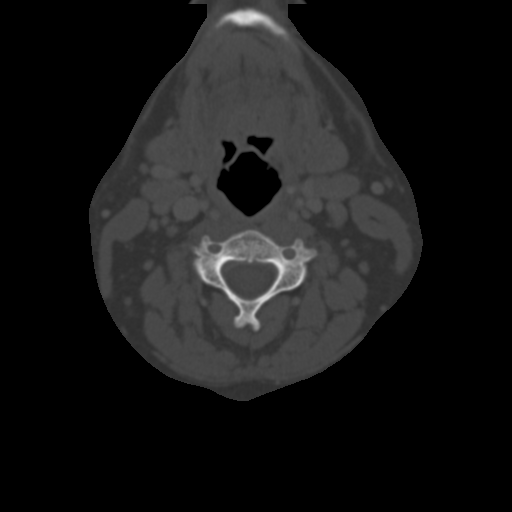
[im 97/117  soft-tissue]
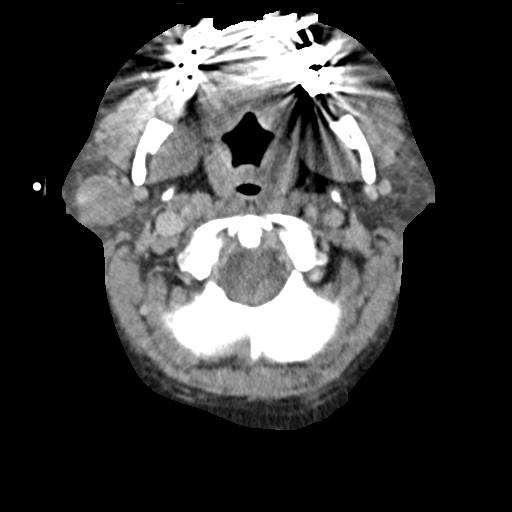
[im 97/117  bone]
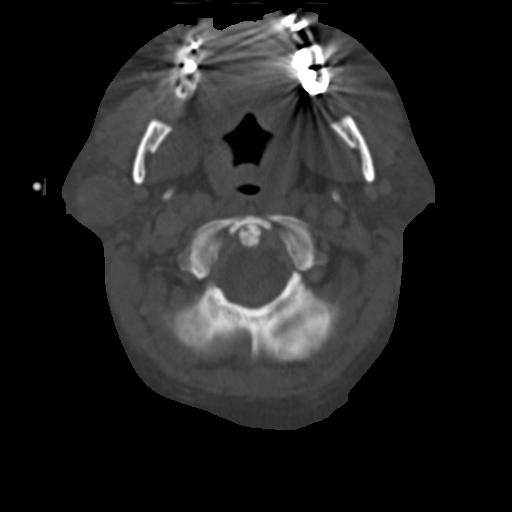

[Series 5: neck 2.0 soft tissue coro · coronal · 0.45mm/px · 3 of 96 slices shown]
[im 20/96  bone]
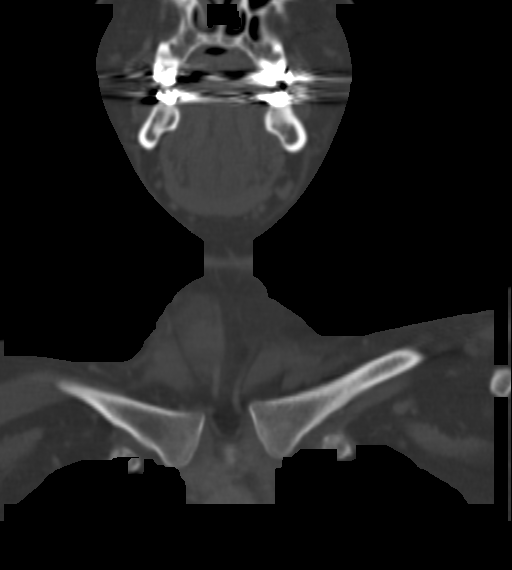
[im 39/96  bone]
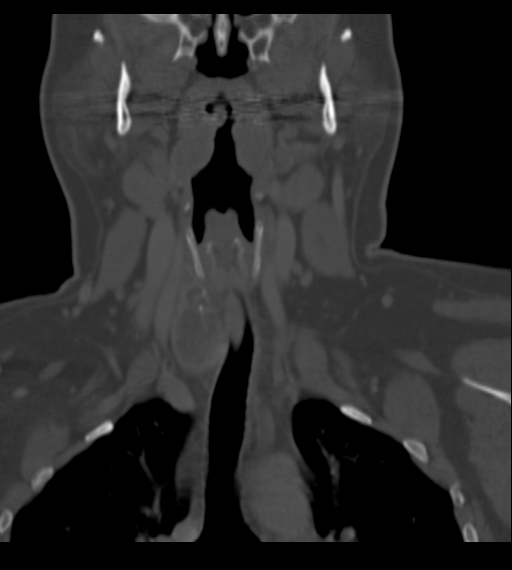
[im 58/96  bone]
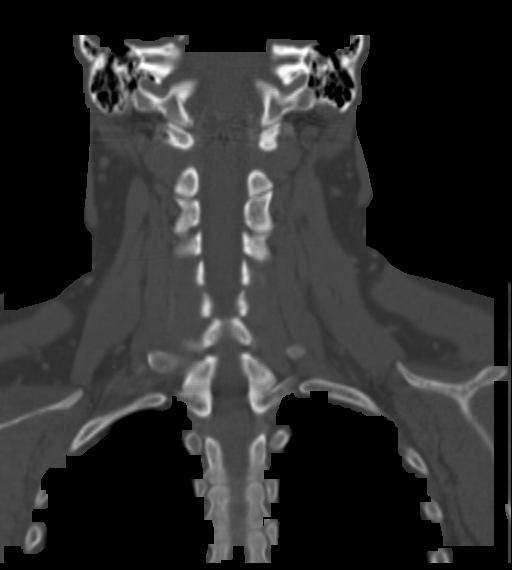

[Series 7: neck 2.0 soft tissue sag · sagittal · 0.42mm/px · 5 of 101 slices shown, 6 images]
[im 34/101  bone]
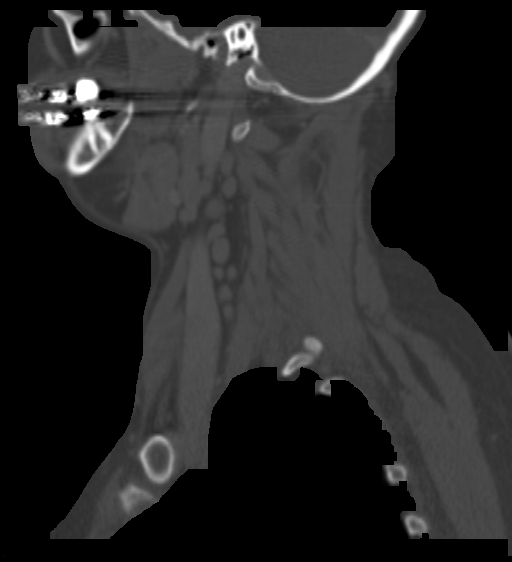
[im 42/101  bone]
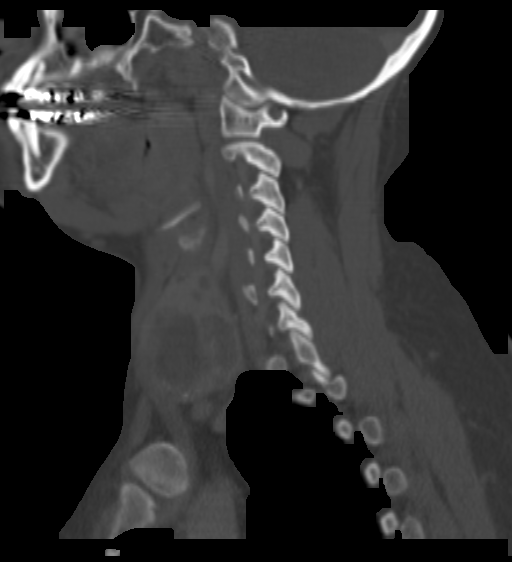
[im 51/101  soft-tissue]
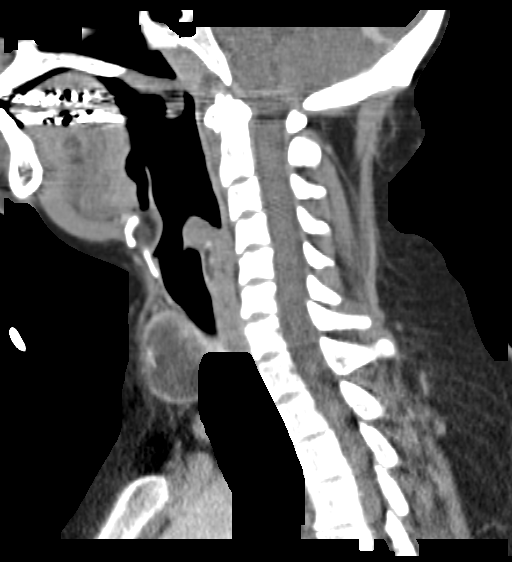
[im 51/101  bone]
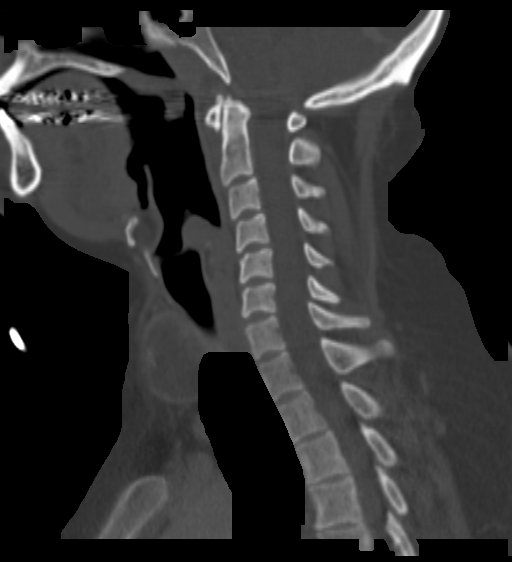
[im 59/101  bone]
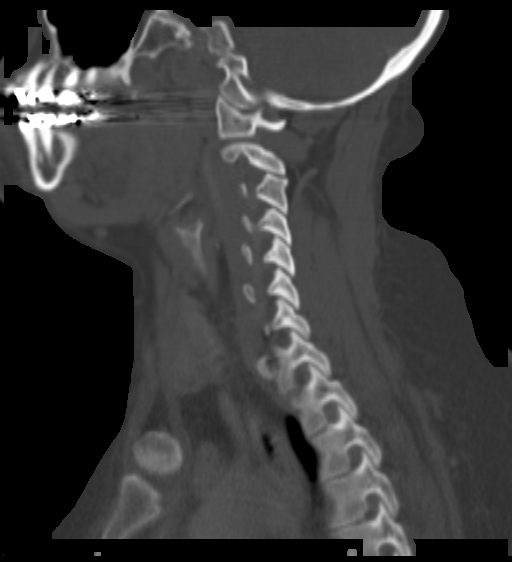
[im 67/101  bone]
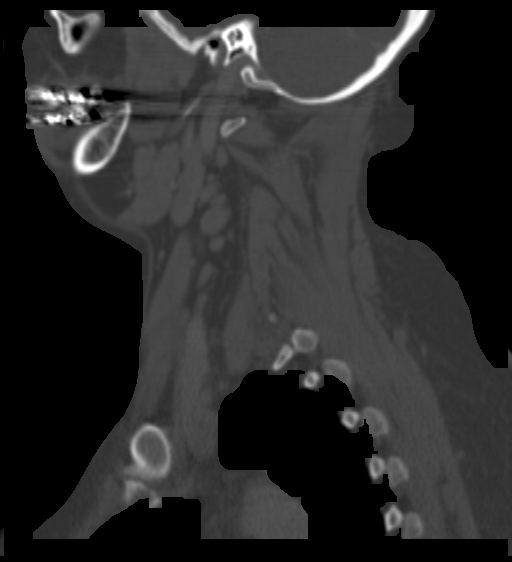

[13 of 33 positions shown; findings below may reference images not displayed]

FINDINGS: Suprahyoid neck:  There is a 22 x 21 x 36 mm mass arising from the
deep lobe of the parotid on the right.  It appears  well
encapsulated without significant necrosis or calcification.  There
are areas of variable enhancement, with most prominent contrast
uptake in the superior and inferior portions.  Findings are most
consistent with a benign mixed tumor (MATSUMOTO).  Given its size, this
lesion must be treated surgically to exclude malignant
degeneration.  A 6 x 6 by 9 mm lesion involving the left parotid
could represent an intraparotid lymph node or a second MATSUMOTO.
Submandibular glands normal.   No sinus or mastoid fluid.  No
tonsillar enlargement.  No mucosal lesion. Infratemporal and
parapharyngeal fat planes preserved.

Larynx:  Normal.

Infrahyoid neck:  Bulky multicystic right greater than left thyroid
masses, similar to previous thyroid ultrasound of 08/24/2007.  It
is not clear that there has been tissue sampling.  The overall size
of the right lobe is quite impressive, measuring 31 x 41 x 48 mm
without retrosternal extension.  Correlation with previous
biopsies, if performed, or current tissue sampling may be
indicated, given the right neck adenopathy described below.  Left
lobe thyroid enlargement less prominent with cross-sectional
measurements of 22 x 18 mm on image 77.  Tracheal deviation noted
from right to left.

Lymph nodes:  Multiple borderline enlarged right sided level II and
level III lymph nodes extend along the carotid sheath. Most have a
short axis measurement less than 1 cm, although a 12 mm node is
seen on image 35.  Given the right parotid and right  thyroid
lesions, this adenopathy is indeterminate for reactive versus
possibly pathologic enlargement.  Correlate with results of parotid
and thyroid tissue sampling.

Upper chest/mediastinum:  No substernal extension of thyroid
gland/goiter.  No visible upper mediastinal lymph nodes.  Lung
apices clear.  Trachea resumes a midline location below the
thyroid.  Arch and great vessels unremarkable.

Additional:  Extracranial vascular structures widely patent.  No
osseous lesions observed.  Normal appearing cervical spine and
intracranial compartment.
IMPRESSION: 22 x 21 x 36 mm deep lobe right parotid mass most consistent with a
benign mixed tumor.  See comments above.

Subcentimeter similar lesion in the superficial lobe of the left
parotid.

Borderline enlarged cervical lymphadenopathy on the right.

Multiple cystic and solid masses of the thyroid, right greater than
left, suggesting goiter.  Recommend tissue sampling if not recently
performed.

## 2014-07-21 ENCOUNTER — Encounter: Payer: Self-pay | Admitting: *Deleted

## 2014-07-21 ENCOUNTER — Encounter: Payer: BC Managed Care – PPO | Attending: Internal Medicine | Admitting: *Deleted

## 2014-07-21 VITALS — Ht 63.5 in | Wt 184.9 lb

## 2014-07-21 DIAGNOSIS — E119 Type 2 diabetes mellitus without complications: Secondary | ICD-10-CM

## 2014-07-21 DIAGNOSIS — Z713 Dietary counseling and surveillance: Secondary | ICD-10-CM | POA: Diagnosis not present

## 2014-07-21 NOTE — Patient Instructions (Signed)
Plan:  Aim for 3 Carb Choices per meal (45 grams) +/- 1 either way  Aim for 0-1 Carbs per snack if hungry  Include protein in moderation with your meals and snacks Consider reading food labels for Total Carbohydrate of foods Consider checking BG at alternate times per day   Continue taking medication as directed by MD

## 2014-07-21 NOTE — Progress Notes (Signed)
Appt start time: 1030 end time:  1200.  Assessment:  Patient was seen on  07/21/14 for individual diabetes education. Lives with husband and 2 young children, they share the shopping and she cooks the meals. 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 She travels with work, visits foster homes all over the state. No overnight visits at this time. Has meter, needs new battery so not checking lately. Likes to walk and swim on the weekends with family.  Patient Education Plan per assessed needs and concerns is to attend individual session for Diabetes Self Management Education.  Current HbA1c: 9.0%  Preferred Learning Style:   Visual  Learning Readiness:   Ready  Change in progress  MEDICATIONS: see list, diabetes medications are Metformin, Amaryl and Invokana  DIETARY INTAKE:  24-hr recall:  B ( AM): PNB crackers at work, if anything, Diet Dr. Malachi Bonds  Snk ( AM): no  L ( PM): goes out for lunch; fast food or sit down restaurant, Diet Dr. Dorthula Nettles ( PM): no D ( PM): mix of home or take out; might eat later, after kids have eaten. May not include meat, starch, not many vegetables, occasionally bread, Diet Dr. Malachi Bonds or unsweet tea with Sweet and Low Snk ( PM): not usually, lately more hungry with chicken salad on cracker Beverages: Diet Dr. Malachi Bonds  Usual physical activity: walks and swims during the summer  Estimated energy needs: 1600 calories 180 g carbohydrates 120 g protein 44 g fat  Progress Towards Goal(s):  In progress.   Nutritional Diagnosis:  NB-1.1 Food and nutrition-related knowledge deficit As related to Diabetes.  As evidenced by A1c of 9.0%.    Intervention:  Nutrition counseling provided.  Discussed diabetes disease process and treatment options.  Discussed physiology of diabetes and role of obesity on insulin resistance.  Encouraged moderate weight reduction to improve glucose levels.    Provided education on  macronutrients on glucose levels.  Provided education on carb counting, importance of regularly scheduled meals/snacks, and meal planning  Reviewed patient medications.  Discussed role of medication on blood glucose and possible side effects  Discussed blood glucose monitoring and interpretation.  Discussed recommended target ranges and individual ranges.    Described short-term complications: hyper- and hypo-glycemia.  Discussed causes,symptoms, and treatment options.  At next visit, plan to:  Discuss effects of physical activity on glucose levels and long-term glucose control.  Recommended 150 minutes of physical activity/week.  Discuss prevention, detection, and treatment of long-term complications.  Discussed the role of prolonged elevated glucose levels on body systems.  Discuss role of stress on blood glucose levels and discussed strategies to manage psychosocial issues.  Discuss recommendations for long-term diabetes self-care.  Established checklist for medical, dental, and emotional self-care.  Plan:  Aim for 3 Carb Choices per meal (45 grams) +/- 1 either way  Aim for 0-1 Carbs per snack if hungry  Include protein in moderation with your meals and snacks Consider reading food labels for Total Carbohydrate of foods Consider checking BG at alternate times per day   Continue taking medication as directed by MD  Teaching Method Utilized: Visual, Auditory and Hands on  Handouts given during visit include: Living Well with Diabetes Carb Counting and Food Label handouts Meal Plan Card Diabetes Medication handout  Insulin Action handout  APPs Resource handout  Barriers to learning/adherence to lifestyle change: apathy toward diabetes after managing it very intensely during 2 pregnancies  Diabetes self-care support  plan:   [place] support group available  Demonstrated degree of understanding via:  Teach Back   Monitoring/Evaluation:  Dietary intake, exercise, reading food  labels, and body weight in 6 week(s).

## 2014-09-09 ENCOUNTER — Ambulatory Visit: Payer: BC Managed Care – PPO | Admitting: *Deleted

## 2014-09-23 ENCOUNTER — Ambulatory Visit: Payer: BC Managed Care – PPO | Admitting: *Deleted

## 2017-04-11 DIAGNOSIS — C73 Malignant neoplasm of thyroid gland: Secondary | ICD-10-CM | POA: Insufficient documentation

## 2017-04-11 DIAGNOSIS — E890 Postprocedural hypothyroidism: Secondary | ICD-10-CM | POA: Insufficient documentation

## 2018-04-07 ENCOUNTER — Encounter (HOSPITAL_COMMUNITY): Payer: Self-pay | Admitting: *Deleted

## 2018-04-14 ENCOUNTER — Other Ambulatory Visit: Payer: Self-pay | Admitting: Obstetrics

## 2018-04-28 ENCOUNTER — Encounter (HOSPITAL_COMMUNITY): Payer: Self-pay

## 2018-04-28 NOTE — Patient Instructions (Addendum)
TYNIA WIERS  04/28/2018   Your procedure is scheduled on: 05-01-18   Report to [facility] Main  Entrance    Report to admitting at 5:30AM    Call this number if you have problems the morning of surgery [phone]     Remember: Do not eat food or drink liquids :After Midnight.     Take these medicines the morning of surgery with A SIP OF WATER: levothyroxine                                You may not have any metal on your body including hair pins and              piercings  Do not wear jewelry, make-up, lotions, powders or perfumes, deodorant             Do not wear nail polish.  Do not shave  48 hours prior to surgery.             Do not bring valuables to the hospital. [facility].  Contacts, dentures or bridgework may not be worn into surgery.  Leave suitcase in the car. After surgery it may be brought to your room.                Please read over the following fact sheets you were given: _____________________________________________________________________             How to Manage Your Diabetes Before and After Surgery  Why is it important to control my blood sugar before and after surgery? . Improving blood sugar levels before and after surgery helps healing and can limit problems. . A way of improving blood sugar control is eating a healthy diet by: o  Eating less sugar and carbohydrates o  Increasing activity/exercise o  Talking with your doctor about reaching your blood sugar goals . High blood sugars (greater than 180 mg/dL) can raise your risk of infections and slow your recovery, so you will need to focus on controlling your diabetes during the weeks before surgery. . Make sure that the doctor who takes care of your diabetes knows about your planned surgery including the date and location.  How do I manage my blood sugar before surgery? . Check your blood sugar at least 4 times  a day, starting 2 days before surgery, to make sure that the level is not too high or low. o Check your blood sugar the morning of your surgery when you wake up and every 2 hours until you get to the Short Stay unit. . If your blood sugar is less than 70 mg/dL, you will need to treat for low blood sugar: o Do not take insulin. o Treat a low blood sugar (less than 70 mg/dL) with  cup of clear juice (cranberry or apple), 4 glucose tablets, OR glucose gel. o Recheck blood sugar in 15 minutes after treatment (to make sure it is greater than 70 mg/dL). If your blood sugar is not greater than 70 mg/dL on recheck, call [phone] for further instructions. . Report your blood sugar to the short stay nurse when you get to Short Stay.  . If you are admitted to the hospital after surgery: o Your blood  sugar will be checked by the staff and you will probably be given insulin after surgery (instead of oral diabetes medicines) to make sure you have good blood sugar levels. o The goal for blood sugar control after surgery is 80-180 mg/dL.   WHAT DO I DO ABOUT MY DIABETES MEDICATION?  Marland Kitchen   . THE DAY BEFORE SURGERY o TAKE CANAGLIFLOZIN(INVOKANA) AS NORMAL    o TAKE GLIMEPIRIDE AS NORMAL  o TAKE METFORMIN AS NORMAL  o TAKE LIRAGLUTIDE(VICTOZA) AS NORMAL      . THE MORNING OF SURGERY, Do not take oral diabetes medicines (pills) ! Do not take other diabetes injectables, including Byetta (exenatide), Bydureon (exenatide ER), Victoza (liraglutide), or Trulicity (dulaglutide).   Patient Signature:  Date:   Nurse Signature:  Date:   Reviewed and Endorsed by [facility] Patient Education Committee, August 2015   [facility] - Preparing for Surgery Before surgery, you can play an important role.  Because skin is not sterile, your skin needs to be as free of germs as possible.  You can reduce the number of germs on your skin by washing with CHG (chlorahexidine gluconate) soap before surgery.  CHG is an  antiseptic cleaner which kills germs and bonds with the skin to continue killing germs even after washing. Please DO NOT use if you have an allergy to CHG or antibacterial soaps.  If your skin becomes reddened/irritated stop using the CHG and inform your nurse when you arrive at Short Stay. Do not shave (including legs and underarms) for at least 48 hours prior to the first CHG shower.  You may shave your face/neck. Please follow these instructions carefully:  1.  Shower with CHG Soap the night before surgery and the  morning of Surgery.  2.  If you choose to wash your hair, wash your hair first as usual with your  normal  shampoo.  3.  After you shampoo, rinse your hair and body thoroughly to remove the  shampoo.                           4.  Use CHG as you would any other liquid soap.  You can apply chg directly  to the skin and wash                       Gently with a scrungie or clean washcloth.  5.  Apply the CHG Soap to your body ONLY FROM THE NECK DOWN.   Do not use on face/ open                           Wound or open sores. Avoid contact with eyes, ears mouth and genitals (private parts).                       Wash face,  Genitals (private parts) with your normal soap.             6.  Wash thoroughly, paying special attention to the area where your surgery  will be performed.  7.  Thoroughly rinse your body with warm water from the neck down.  8.  DO NOT shower/wash with your normal soap after using and rinsing off  the CHG Soap.                9.  Pat yourself dry with a clean towel.  10.  Wear clean pajamas.            11.  Place clean sheets on your bed the night of your first shower and do not  sleep with pets. Day of Surgery : Do not apply any lotions/deodorants the morning of surgery.  Please wear clean clothes to the hospital/surgery center.  FAILURE TO FOLLOW THESE INSTRUCTIONS MAY RESULT IN THE CANCELLATION OF YOUR SURGERY PATIENT  SIGNATURE_________________________________  NURSE SIGNATURE__________________________________  ________________________________________________________________________

## 2018-04-28 NOTE — Progress Notes (Signed)
[facility] end[facility] Dr Legrand Como Altheimer 10-30-17 on chart   Labs 12-31-17 on chart from [facility]

## 2018-04-29 ENCOUNTER — Other Ambulatory Visit: Payer: Self-pay

## 2018-04-29 ENCOUNTER — Encounter (HOSPITAL_COMMUNITY): Payer: Self-pay | Admitting: Emergency Medicine

## 2018-04-29 ENCOUNTER — Encounter (HOSPITAL_COMMUNITY)
Admission: RE | Admit: 2018-04-29 | Discharge: 2018-04-29 | Disposition: A | Discharge status: Home / Self Care | Payer: Commercial Managed Care - PPO | Source: Ambulatory Visit | Attending: Obstetrics | Admitting: Obstetrics

## 2018-04-29 DIAGNOSIS — Z85850 Personal history of malignant neoplasm of thyroid: Secondary | ICD-10-CM | POA: Diagnosis not present

## 2018-04-29 DIAGNOSIS — F17200 Nicotine dependence, unspecified, uncomplicated: Secondary | ICD-10-CM | POA: Diagnosis not present

## 2018-04-29 DIAGNOSIS — M6208 Separation of muscle (nontraumatic), other site: Secondary | ICD-10-CM | POA: Diagnosis not present

## 2018-04-29 DIAGNOSIS — Z888 Allergy status to other drugs, medicaments and biological substances status: Secondary | ICD-10-CM | POA: Diagnosis not present

## 2018-04-29 DIAGNOSIS — E119 Type 2 diabetes mellitus without complications: Secondary | ICD-10-CM | POA: Diagnosis not present

## 2018-04-29 DIAGNOSIS — Z79899 Other long term (current) drug therapy: Secondary | ICD-10-CM | POA: Diagnosis not present

## 2018-04-29 DIAGNOSIS — E785 Hyperlipidemia, unspecified: Secondary | ICD-10-CM | POA: Diagnosis not present

## 2018-04-29 DIAGNOSIS — D251 Intramural leiomyoma of uterus: Secondary | ICD-10-CM | POA: Diagnosis not present

## 2018-04-29 DIAGNOSIS — E039 Hypothyroidism, unspecified: Secondary | ICD-10-CM | POA: Diagnosis not present

## 2018-04-29 DIAGNOSIS — J45909 Unspecified asthma, uncomplicated: Secondary | ICD-10-CM | POA: Diagnosis not present

## 2018-04-29 DIAGNOSIS — Z7984 Long term (current) use of oral hypoglycemic drugs: Secondary | ICD-10-CM | POA: Diagnosis not present

## 2018-04-29 DIAGNOSIS — I10 Essential (primary) hypertension: Secondary | ICD-10-CM | POA: Diagnosis not present

## 2018-04-29 DIAGNOSIS — R102 Pelvic and perineal pain: Secondary | ICD-10-CM | POA: Diagnosis present

## 2018-04-29 HISTORY — DX: Malignant neoplasm of thyroid gland: C73

## 2018-04-29 HISTORY — DX: Hyperlipidemia, unspecified: E78.5

## 2018-04-29 HISTORY — DX: Hypothyroidism, unspecified: E03.9

## 2018-04-29 LAB — BASIC METABOLIC PANEL
ANION GAP: 14 (ref 5–15)
BUN: 10 mg/dL (ref 6–20)
CALCIUM: 9.4 mg/dL (ref 8.9–10.3)
CO2: 24 mmol/L (ref 22–32)
Chloride: 99 mmol/L — ABNORMAL LOW (ref 101–111)
Creatinine, Ser: 0.62 mg/dL (ref 0.44–1.00)
GFR calc non Af Amer: >60 mL/min (ref >60)
GLUCOSE: 105 mg/dL — AB (ref 65–99)
Potassium: 3.3 mmol/L — ABNORMAL LOW (ref 3.5–5.1)
Sodium: 137 mmol/L (ref 135–145)

## 2018-04-29 LAB — CBC
HEMATOCRIT: 45.5 % (ref 36.0–46.0)
HEMOGLOBIN: 15.8 g/dL — AB (ref 12.0–15.0)
MCH: 28.9 pg (ref 26.0–34.0)
MCHC: 34.7 g/dL (ref 30.0–36.0)
MCV: 83.3 fL (ref 78.0–100.0)
Platelets: 408 10*3/uL — ABNORMAL HIGH (ref 150–400)
RBC: 5.46 MIL/uL — AB (ref 3.87–5.11)
RDW: 13.4 % (ref 11.5–15.5)
WBC: 17 10*3/uL — ABNORMAL HIGH (ref 4.0–10.5)

## 2018-04-29 LAB — GLUCOSE, CAPILLARY: Glucose-Capillary: 107 mg/dL — ABNORMAL HIGH (ref 65–99)

## 2018-04-29 LAB — ABO/RH: ABO/RH(D): A NEG

## 2018-04-29 LAB — PREGNANCY, URINE: PREG TEST UR: NEGATIVE

## 2018-04-29 LAB — HEMOGLOBIN A1C
Hgb A1c MFr Bld: 7.2 % — ABNORMAL HIGH (ref 4.8–5.6)
Mean Plasma Glucose: 159.94 mg/dL

## 2018-04-29 NOTE — Progress Notes (Signed)
CBC routed via epic to Dr Aloha Gell

## 2018-04-30 ENCOUNTER — Encounter (HOSPITAL_COMMUNITY): Payer: Self-pay | Admitting: Anesthesiology

## 2018-04-30 NOTE — H&P (Signed)
CC: pelvic pain  HPIL 42 yo G3P2 with persistent abdominal/ pelvic pain, w/o bleeding, and history suggestive of Asherman syndrome. Pt with h/o endometrial ablation and TL, since she's had cyclic breast tenderness and HA and progressive pelvic pressure. U/s showing cystic spaced in endometrium but ut 9x6x5 cm, 85 cc and nl b/l adnexa. Nl pap/ HPV neg 8/18 and u/s guided D&C with nl path 11/18.   Pt notes no CP, SOB, new GI sx. Pt denies recent fever, cough, dysuria. Son with GI bug last week.   Past Medical History:  Diagnosis Date  . Diabetes mellitus    NIDDM, type 2   . Dyslipidemia   . Goiter    no current meds.  . Hypertension    under control; has been on med. x 5-6 yrs.  . Hypothyroidism   . 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 Parotid mass 11/2011   right  . PONV (postoperative nausea and vomiting)    nauseated when ate after surgery  . Thyroid mass     Past Surgical History:  Procedure Laterality Date  . ABLATION  2016 or 2017 unsure   obgyn dr Pamala Hurry   . CESAREAN SECTION  10/18/2008  . CESAREAN SECTION W/BTL  04/08/2011  . LAPAROSCOPIC CHOLECYSTECTOMY  06/05/2009  . PAROTIDECTOMY  12/17/2011   Procedure: PAROTIDECTOMY;  Surgeon: Ascencion Dike, MD;  Location: [facility];  Service: ENT;  Laterality: Right;  Total right parotidectomy  . THYROIDECTOMY  05/13/2012   Procedure: THYROIDECTOMY;  Surgeon: Ascencion Dike, MD;  Location: [facility] OR;  Service: ENT;  Laterality: Bilateral;  TOTAL THYROIDECTOMY    Adhesive allergy  PE: Vitals:   05/01/18 0539 05/01/18 0554  BP: 115/80   Pulse: 93   Resp: 16   Temp: 98.3 F (36.8 C)   TempSrc: Oral   SpO2: 99%   Weight:  76.2 kg (168 lb)  Height:  5\' 3"  (1.6 m)   Gen: well appearing, no distres Abd: soft, NT, ND GU: def to OR LE: NT, no edema  CBC    Component Value Date/Time   WBC 17.0 (H) 04/29/2018 1120   RBC 5.46 (H) 04/29/2018 1120    HGB 15.8 (H) 04/29/2018 1120   HCT 45.5 04/29/2018 1120   PLT 408 (H) 04/29/2018 1120   MCV 83.3 04/29/2018 1120   MCH 28.9 04/29/2018 1120   MCHC 34.7 04/29/2018 1120   RDW 13.4 04/29/2018 1120     A/P: 42 you G3P2 with continued pelvic pain and bloating, 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 Pt has failed expectant managament, not a good candidate for hormonal control due to T2DM and htn and desiring definitive management. Pt aware R/B of surgery including medical and anasthetic complications, risks of bleeding, infection, damage to surrounding organs, and failure to control her pain/ bloating symptoms.   Increased WBC, no baseline, ? Stress reaction as pt nervous about surgery. Pt reports no focal symptoms and requests to proceed with scheduled surgery.   Ala Dach 05/01/2018 7:27 AM

## 2018-04-30 NOTE — Anesthesia Preprocedure Evaluation (Addendum)
Anesthesia Evaluation  Patient identified by MRN, date of birth, ID band Patient awake    Reviewed: Allergy & Precautions, NPO status , Patient's Chart, lab work & pertinent test results  History of Anesthesia Complications (+) PONV  Airway Mallampati: I  TM Distance: >3 FB Neck ROM: Full    Dental   Pulmonary asthma , Current Smoker,    Pulmonary exam normal        Cardiovascular hypertension, Pt. on medications Normal cardiovascular exam     Neuro/Psych    GI/Hepatic   Endo/Other  diabetes, Type 2, Oral Hypoglycemic AgentsHypothyroidism   Renal/GU      Musculoskeletal   Abdominal   Peds  Hematology   Anesthesia Other Findings   Reproductive/Obstetrics                           Anesthesia Physical Anesthesia Plan  ASA: II  Anesthesia Plan: General   Post-op Pain Management:    Induction: Intravenous  PONV Risk Score and Plan: 3 and Ondansetron, Dexamethasone and Midazolam  Airway Management Planned: Oral ETT  Additional Equipment:   Intra-op Plan:   Post-operative Plan: Extubation in OR  Informed Consent: I have reviewed the patients History and Physical, chart, labs and discussed the procedure including the risks, benefits and alternatives for the proposed anesthesia with the patient or authorized representative who has indicated his/her understanding and acceptance.     Plan Discussed with: CRNA and Surgeon  Anesthesia Plan Comments:         Anesthesia Quick Evaluation

## 2018-05-01 ENCOUNTER — Encounter (HOSPITAL_COMMUNITY): Payer: Self-pay | Admitting: Emergency Medicine

## 2018-05-01 ENCOUNTER — Ambulatory Visit (HOSPITAL_COMMUNITY): Payer: Commercial Managed Care - PPO | Admitting: Anesthesiology

## 2018-05-01 ENCOUNTER — Encounter (HOSPITAL_COMMUNITY)
Admission: RE | Disposition: A | Discharge status: Home / Self Care | Payer: Self-pay | Source: Ambulatory Visit | Attending: Obstetrics

## 2018-05-01 ENCOUNTER — Ambulatory Visit (HOSPITAL_COMMUNITY)
Admission: RE | Admit: 2018-05-01 | Discharge: 2018-05-02 | Disposition: A | Discharge status: Home / Self Care | Payer: Commercial Managed Care - PPO | Source: Ambulatory Visit | Attending: Obstetrics | Admitting: Obstetrics

## 2018-05-01 DIAGNOSIS — R102 Pelvic and perineal pain: Secondary | ICD-10-CM | POA: Insufficient documentation

## 2018-05-01 DIAGNOSIS — Z85850 Personal history of malignant neoplasm of thyroid: Secondary | ICD-10-CM | POA: Insufficient documentation

## 2018-05-01 DIAGNOSIS — Z888 Allergy status to other drugs, medicaments and biological substances status: Secondary | ICD-10-CM | POA: Insufficient documentation

## 2018-05-01 DIAGNOSIS — J45909 Unspecified asthma, uncomplicated: Secondary | ICD-10-CM | POA: Insufficient documentation

## 2018-05-01 DIAGNOSIS — D251 Intramural leiomyoma of uterus: Secondary | ICD-10-CM | POA: Insufficient documentation

## 2018-05-01 DIAGNOSIS — Z90710 Acquired absence of both cervix and uterus: Secondary | ICD-10-CM | POA: Diagnosis present

## 2018-05-01 DIAGNOSIS — E119 Type 2 diabetes mellitus without complications: Secondary | ICD-10-CM | POA: Insufficient documentation

## 2018-05-01 DIAGNOSIS — Z7984 Long term (current) use of oral hypoglycemic drugs: Secondary | ICD-10-CM | POA: Insufficient documentation

## 2018-05-01 DIAGNOSIS — E039 Hypothyroidism, unspecified: Secondary | ICD-10-CM | POA: Insufficient documentation

## 2018-05-01 DIAGNOSIS — E785 Hyperlipidemia, unspecified: Secondary | ICD-10-CM | POA: Insufficient documentation

## 2018-05-01 DIAGNOSIS — I10 Essential (primary) hypertension: Secondary | ICD-10-CM | POA: Insufficient documentation

## 2018-05-01 DIAGNOSIS — M6208 Separation of muscle (nontraumatic), other site: Secondary | ICD-10-CM | POA: Insufficient documentation

## 2018-05-01 DIAGNOSIS — F17200 Nicotine dependence, unspecified, uncomplicated: Secondary | ICD-10-CM | POA: Insufficient documentation

## 2018-05-01 DIAGNOSIS — Z79899 Other long term (current) drug therapy: Secondary | ICD-10-CM | POA: Insufficient documentation

## 2018-05-01 HISTORY — PX: ROBOTIC ASSISTED LAPAROSCOPIC HYSTERECTOMY AND SALPINGECTOMY: SHX6379

## 2018-05-01 HISTORY — PX: ROBOTIC ASSISTED LAPAROSCOPIC LYSIS OF ADHESION: SHX6080

## 2018-05-01 LAB — TYPE AND SCREEN
ABO/RH(D): A NEG
Antibody Screen: NEGATIVE

## 2018-05-01 LAB — GLUCOSE, CAPILLARY
GLUCOSE-CAPILLARY: 186 mg/dL — AB (ref 65–99)
Glucose-Capillary: 146 mg/dL — ABNORMAL HIGH (ref 65–99)
Glucose-Capillary: 236 mg/dL — ABNORMAL HIGH (ref 65–99)

## 2018-05-01 SURGERY — XI ROBOTIC ASSISTED LAPAROSCOPIC HYSTERECTOMY AND SALPINGECTOMY
Anesthesia: General | Laterality: Bilateral

## 2018-05-01 MED ORDER — MEPERIDINE HCL 50 MG/ML IJ SOLN: 6.2500 mg | INTRAMUSCULAR | Status: DC | PRN

## 2018-05-01 MED ORDER — SODIUM CHLORIDE 0.9 % IJ SOLN
INTRAMUSCULAR | Status: AC
  Filled 2018-05-01: qty 10

## 2018-05-01 MED ORDER — HYDROMORPHONE HCL 1 MG/ML IJ SOLN
INTRAMUSCULAR | Status: AC
  Filled 2018-05-01: qty 1

## 2018-05-01 MED ORDER — HYDROMORPHONE HCL 1 MG/ML IJ SOLN
1.0000 mg | INTRAMUSCULAR | Status: DC | PRN
  Administered 2018-05-01: 0.5 mg via INTRAVENOUS
  Administered 2018-05-01: 1 mg via INTRAVENOUS

## 2018-05-01 MED ORDER — LEVOTHYROXINE SODIUM 200 MCG PO TABS
200.0000 ug | ORAL_TABLET | ORAL | Status: DC
  Filled 2018-05-01: qty 1

## 2018-05-01 MED ORDER — ONDANSETRON HCL 4 MG/2ML IJ SOLN: 4.0000 mg | Freq: Four times a day (QID) | INTRAMUSCULAR | Status: DC | PRN

## 2018-05-01 MED ORDER — BUPIVACAINE HCL (PF) 0.25 % IJ SOLN
INTRAMUSCULAR | Status: DC | PRN
  Administered 2018-05-01: 15 mL

## 2018-05-01 MED ORDER — MENTHOL 3 MG MT LOZG: 1.0000 | LOZENGE | OROMUCOSAL | Status: DC | PRN

## 2018-05-01 MED ORDER — KETOROLAC TROMETHAMINE 30 MG/ML IJ SOLN
INTRAMUSCULAR | Status: AC
  Filled 2018-05-01: qty 1

## 2018-05-01 MED ORDER — PROPOFOL 10 MG/ML IV BOLUS
INTRAVENOUS | Status: AC
  Filled 2018-05-01: qty 20

## 2018-05-01 MED ORDER — SUGAMMADEX SODIUM 200 MG/2ML IV SOLN
INTRAVENOUS | Status: DC | PRN
  Administered 2018-05-01: 175 mg via INTRAVENOUS

## 2018-05-01 MED ORDER — ROPIVACAINE HCL 5 MG/ML IJ SOLN
INTRAMUSCULAR | Status: AC
  Filled 2018-05-01: qty 30

## 2018-05-01 MED ORDER — KETOROLAC TROMETHAMINE 30 MG/ML IJ SOLN: 30.0000 mg | Freq: Four times a day (QID) | INTRAMUSCULAR | Status: DC

## 2018-05-01 MED ORDER — BUPIVACAINE HCL (PF) 0.25 % IJ SOLN
INTRAMUSCULAR | Status: AC
  Filled 2018-05-01: qty 30

## 2018-05-01 MED ORDER — LACTATED RINGERS IV SOLN
INTRAVENOUS | Status: DC
  Administered 2018-05-01 (×2): via INTRAVENOUS

## 2018-05-01 MED ORDER — SODIUM CHLORIDE 0.9 % IJ SOLN
INTRAMUSCULAR | Status: AC
  Filled 2018-05-01: qty 50

## 2018-05-01 MED ORDER — HYDROMORPHONE HCL 1 MG/ML IJ SOLN
0.2500 mg | INTRAMUSCULAR | Status: DC | PRN
  Administered 2018-05-01: 0.5 mg via INTRAVENOUS

## 2018-05-01 MED ORDER — ONDANSETRON HCL 4 MG/2ML IJ SOLN
INTRAMUSCULAR | Status: AC
  Filled 2018-05-01: qty 2

## 2018-05-01 MED ORDER — SIMETHICONE 80 MG PO CHEW
80.0000 mg | CHEWABLE_TABLET | Freq: Four times a day (QID) | ORAL | Status: DC | PRN
  Filled 2018-05-01: qty 1

## 2018-05-01 MED ORDER — HYDROMORPHONE HCL 1 MG/ML IJ SOLN
INTRAMUSCULAR | Status: AC
  Administered 2018-05-01: 1 mg via INTRAVENOUS
  Filled 2018-05-01: qty 1

## 2018-05-01 MED ORDER — MIDAZOLAM HCL 2 MG/2ML IJ SOLN
INTRAMUSCULAR | Status: AC
  Filled 2018-05-01: qty 2

## 2018-05-01 MED ORDER — MIDAZOLAM HCL 5 MG/5ML IJ SOLN
INTRAMUSCULAR | Status: DC | PRN
  Administered 2018-05-01: 2 mg via INTRAVENOUS

## 2018-05-01 MED ORDER — ONDANSETRON HCL 4 MG PO TABS
4.0000 mg | ORAL_TABLET | Freq: Four times a day (QID) | ORAL | Status: DC | PRN
  Filled 2018-05-01: qty 1

## 2018-05-01 MED ORDER — LIDOCAINE HCL (CARDIAC) PF 100 MG/5ML IV SOSY
PREFILLED_SYRINGE | INTRAVENOUS | Status: DC | PRN
  Administered 2018-05-01: 60 mg via INTRAVENOUS

## 2018-05-01 MED ORDER — SCOPOLAMINE 1 MG/3DAYS TD PT72
MEDICATED_PATCH | TRANSDERMAL | Status: AC
  Administered 2018-05-01: 1.5 mg via TRANSDERMAL
  Filled 2018-05-01: qty 1

## 2018-05-01 MED ORDER — ONDANSETRON HCL 4 MG/2ML IJ SOLN
INTRAMUSCULAR | Status: DC | PRN
  Administered 2018-05-01: 4 mg via INTRAVENOUS

## 2018-05-01 MED ORDER — PANTOPRAZOLE SODIUM 40 MG PO TBEC
40.0000 mg | DELAYED_RELEASE_TABLET | Freq: Every day | ORAL | Status: DC
  Administered 2018-05-01: 40 mg via ORAL
  Filled 2018-05-01: qty 1

## 2018-05-01 MED ORDER — SODIUM CHLORIDE 0.9 % IV SOLN
INTRAVENOUS | Status: DC
  Administered 2018-05-01: 1000 mL via INTRAVENOUS

## 2018-05-01 MED ORDER — OXYCODONE-ACETAMINOPHEN 5-325 MG PO TABS
1.0000 | ORAL_TABLET | ORAL | Status: DC | PRN
  Administered 2018-05-01: 1 via ORAL

## 2018-05-01 MED ORDER — SCOPOLAMINE 1 MG/3DAYS TD PT72
1.0000 | MEDICATED_PATCH | TRANSDERMAL | Status: DC
  Administered 2018-05-01: 1.5 mg via TRANSDERMAL

## 2018-05-01 MED ORDER — FENTANYL CITRATE (PF) 100 MCG/2ML IJ SOLN
INTRAMUSCULAR | Status: DC | PRN
  Administered 2018-05-01 (×2): 100 ug via INTRAVENOUS
  Administered 2018-05-01: 50 ug via INTRAVENOUS

## 2018-05-01 MED ORDER — LIDOCAINE 20MG/ML (2%) 15 ML SYRINGE OPTIME
INTRAMUSCULAR | Status: DC | PRN
  Administered 2018-05-01: 1.5 mg/kg/h via INTRAVENOUS

## 2018-05-01 MED ORDER — OXYCODONE-ACETAMINOPHEN 5-325 MG PO TABS
ORAL_TABLET | ORAL | Status: AC
  Filled 2018-05-01: qty 1

## 2018-05-01 MED ORDER — HEPARIN SODIUM (PORCINE) 5000 UNIT/ML IJ SOLN
5000.0000 [IU] | INTRAMUSCULAR | Status: AC
  Administered 2018-05-01: 5000 [IU] via SUBCUTANEOUS
  Filled 2018-05-01: qty 1

## 2018-05-01 MED ORDER — ROCURONIUM BROMIDE 100 MG/10ML IV SOLN
INTRAVENOUS | Status: DC | PRN
  Administered 2018-05-01 (×2): 20 mg via INTRAVENOUS
  Administered 2018-05-01: 50 mg via INTRAVENOUS

## 2018-05-01 MED ORDER — METFORMIN HCL ER 500 MG PO TB24
2000.0000 mg | ORAL_TABLET | Freq: Every evening | ORAL | Status: DC
  Administered 2018-05-01: 2000 mg via ORAL
  Filled 2018-05-01: qty 4

## 2018-05-01 MED ORDER — SUGAMMADEX SODIUM 200 MG/2ML IV SOLN
INTRAVENOUS | Status: AC
  Filled 2018-05-01: qty 2

## 2018-05-01 MED ORDER — PROPOFOL 10 MG/ML IV BOLUS
INTRAVENOUS | Status: DC | PRN
  Administered 2018-05-01: 150 mg via INTRAVENOUS

## 2018-05-01 MED ORDER — IBUPROFEN 200 MG PO TABS: 600.0000 mg | ORAL_TABLET | Freq: Four times a day (QID) | ORAL | Status: DC | PRN

## 2018-05-01 MED ORDER — KETOROLAC TROMETHAMINE 30 MG/ML IJ SOLN
30.0000 mg | Freq: Four times a day (QID) | INTRAMUSCULAR | Status: DC
  Administered 2018-05-01 – 2018-05-02 (×4): 30 mg via INTRAVENOUS

## 2018-05-01 MED ORDER — SODIUM CHLORIDE 0.9 % IR SOLN
Status: DC | PRN
  Administered 2018-05-01: 3000 mL

## 2018-05-01 MED ORDER — ATORVASTATIN CALCIUM 20 MG PO TABS
20.0000 mg | ORAL_TABLET | Freq: Every evening | ORAL | Status: DC
  Filled 2018-05-01: qty 1

## 2018-05-01 MED ORDER — CANAGLIFLOZIN 100 MG PO TABS
300.0000 mg | ORAL_TABLET | Freq: Once | ORAL | Status: DC
  Filled 2018-05-01: qty 3

## 2018-05-01 MED ORDER — SODIUM CHLORIDE 0.9 % IV SOLN
INTRAVENOUS | Status: DC | PRN
  Administered 2018-05-01: 09:00:00
  Administered 2018-05-01: 60 mL

## 2018-05-01 MED ORDER — ONDANSETRON HCL 4 MG/2ML IJ SOLN: 4.0000 mg | Freq: Once | INTRAMUSCULAR | Status: DC | PRN

## 2018-05-01 MED ORDER — FENTANYL CITRATE (PF) 250 MCG/5ML IJ SOLN
INTRAMUSCULAR | Status: AC
  Filled 2018-05-01: qty 5

## 2018-05-01 MED ORDER — CEFAZOLIN SODIUM-DEXTROSE 2-4 GM/100ML-% IV SOLN
2.0000 g | INTRAVENOUS | Status: AC
  Administered 2018-05-01: 2 g via INTRAVENOUS
  Filled 2018-05-01: qty 100

## 2018-05-01 MED ORDER — DEXAMETHASONE SODIUM PHOSPHATE 10 MG/ML IJ SOLN
INTRAMUSCULAR | Status: DC | PRN
  Administered 2018-05-01: 8 mg via INTRAVENOUS

## 2018-05-01 SURGICAL SUPPLY — 56 items
BARRIER ADHS 3X4 INTERCEED (GAUZE/BANDAGES/DRESSINGS) IMPLANT
CATH FOLEY 3WAY  5CC 16FR (CATHETERS) ×2
CATH FOLEY 3WAY 5CC 16FR (CATHETERS) ×2 IMPLANT
CONT PATH 16OZ SNAP LID 3702 (MISCELLANEOUS) ×4 IMPLANT
COVER BACK TABLE 60X90IN (DRAPES) ×4 IMPLANT
COVER TIP SHEARS 8 DVNC (MISCELLANEOUS) ×2 IMPLANT
COVER TIP SHEARS 8MM DA VINCI (MISCELLANEOUS) ×2
DECANTER SPIKE VIAL GLASS SM (MISCELLANEOUS) ×8 IMPLANT
DEFOGGER SCOPE WARMER CLEARIFY (MISCELLANEOUS) ×4 IMPLANT
DERMABOND ADVANCED (GAUZE/BANDAGES/DRESSINGS) ×2
DERMABOND ADVANCED .7 DNX12 (GAUZE/BANDAGES/DRESSINGS) ×2 IMPLANT
DRAPE ARM DVNC X/XI (DISPOSABLE) ×8 IMPLANT
DRAPE COLUMN DVNC XI (DISPOSABLE) ×2 IMPLANT
DRAPE DA VINCI XI ARM (DISPOSABLE) ×8
DRAPE DA VINCI XI COLUMN (DISPOSABLE) ×2
DURAPREP 26ML APPLICATOR (WOUND CARE) ×4 IMPLANT
ELECT REM PT RETURN 15FT ADLT (MISCELLANEOUS) ×4 IMPLANT
GLOVE BIO SURGEON STRL SZ 6.5 (GLOVE) ×9 IMPLANT
GLOVE BIO SURGEONS STRL SZ 6.5 (GLOVE) ×3
GLOVE BIOGEL PI IND STRL 7.0 (GLOVE) ×10 IMPLANT
GLOVE BIOGEL PI INDICATOR 7.0 (GLOVE) ×10
IRRIG SUCT STRYKERFLOW 2 WTIP (MISCELLANEOUS) ×4
IRRIGATION SUCT STRKRFLW 2 WTP (MISCELLANEOUS) ×2 IMPLANT
LEGGING LITHOTOMY PAIR STRL (DRAPES) ×4 IMPLANT
NEEDLE INSUFFLATION 120MM (ENDOMECHANICALS) ×4 IMPLANT
NEEDLE INSUFFLATION 14GA 120MM (NEEDLE) ×4 IMPLANT
OBTURATOR OPTICAL STANDARD 8MM (TROCAR) ×2
OBTURATOR OPTICAL STND 8 DVNC (TROCAR) ×2
OBTURATOR OPTICALSTD 8 DVNC (TROCAR) ×2 IMPLANT
OCCLUDER COLPOPNEUMO (BALLOONS) ×4 IMPLANT
PACK ROBOT WH (CUSTOM PROCEDURE TRAY) ×4 IMPLANT
PACK ROBOTIC GOWN (GOWN DISPOSABLE) ×4 IMPLANT
PACK TRENDGUARD 450 HYBRID PRO (MISCELLANEOUS) ×2 IMPLANT
PAD PREP 24X48 CUFFED NSTRL (MISCELLANEOUS) ×4 IMPLANT
POSITIONER SURGICAL ARM (MISCELLANEOUS) ×8 IMPLANT
SEAL CANN UNIV 5-8 DVNC XI (MISCELLANEOUS) ×6 IMPLANT
SEAL XI 5MM-8MM UNIVERSAL (MISCELLANEOUS) ×6
SET CYSTO W/LG BORE CLAMP LF (SET/KITS/TRAYS/PACK) IMPLANT
SET TRI-LUMEN FLTR TB AIRSEAL (TUBING) ×4 IMPLANT
SUT VIC AB 0 CT1 27 (SUTURE) ×4
SUT VIC AB 0 CT1 27XBRD ANBCTR (SUTURE) ×4 IMPLANT
SUT VIC AB 4-0 PS2 27 (SUTURE) ×8 IMPLANT
SUT VICRYL 0 UR6 27IN ABS (SUTURE) IMPLANT
SUT VLOC 180 0 9IN  GS21 (SUTURE) ×2
SUT VLOC 180 0 9IN GS21 (SUTURE) ×2 IMPLANT
TIP RUMI ORANGE 6.7MMX12CM (TIP) IMPLANT
TIP UTERINE 5.1X6CM LAV DISP (MISCELLANEOUS) IMPLANT
TIP UTERINE 6.7X10CM GRN DISP (MISCELLANEOUS) ×4 IMPLANT
TIP UTERINE 6.7X6CM WHT DISP (MISCELLANEOUS) IMPLANT
TIP UTERINE 6.7X8CM BLUE DISP (MISCELLANEOUS) IMPLANT
TOWEL OR 17X26 10 PK STRL BLUE (TOWEL DISPOSABLE) ×8 IMPLANT
TOWEL OR NON WOVEN STRL DISP B (DISPOSABLE) ×4 IMPLANT
TRENDGUARD 450 HYBRID PRO PACK (MISCELLANEOUS) ×4
TROCAR PORT AIRSEAL 8X100 (TROCAR) ×4 IMPLANT
TROCAR PORT AIRSEAL 8X120 (TROCAR) ×4 IMPLANT
WATER STERILE IRR 1000ML POUR (IV SOLUTION) ×4 IMPLANT

## 2018-05-01 NOTE — Progress Notes (Signed)
Dr. Valentino Saxon notified of pt WBC.

## 2018-05-01 NOTE — Anesthesia Procedure Notes (Signed)
Procedure Name: Intubation Date/Time: 05/01/2018 7:42 AM Performed by: Glory Buff, CRNA Pre-anesthesia Checklist: Patient identified, Emergency Drugs available, Suction available and Patient being monitored Patient Re-evaluated:Patient Re-evaluated prior to induction Oxygen Delivery Method: Circle system utilized Preoxygenation: Pre-oxygenation with 100% oxygen Induction Type: IV induction Ventilation: Mask ventilation without difficulty Laryngoscope Size: Miller and 3 Grade View: Grade I Tube type: Oral Tube size: 7.0 mm Number of attempts: 1 Airway Equipment and Method: Stylet and Oral airway Placement Confirmation: ETT inserted through vocal cords under direct vision,  positive ETCO2 and breath sounds checked- equal and bilateral Secured at: 20 cm Tube secured with: Tape Dental Injury: Teeth and Oropharynx as per pre-operative assessment

## 2018-05-01 NOTE — Progress Notes (Signed)
Pt ambulated unassisted thru [facility] recovery area

## 2018-05-01 NOTE — Op Note (Signed)
05/01/2018  10:25 AM  PATIENT:  Carla Knight  42 y.o. female  PRE-OPERATIVE DIAGNOSIS:  Pelvic Pain  POST-OPERATIVE DIAGNOSIS:  Pelvic Pain  PROCEDURE:  Procedure(s) with comments: XI ROBOTIC ASSISTED LAPAROSCOPIC HYSTERECTOMY AND SALPINGECTOMY (Bilateral) - Requests 3 1/2 hrs. XI ROBOTIC ASSISTED LAPAROSCOPIC LYSIS OF ADHESION  SURGEON:  Surgeon(s) and Role:    * Aloha Gell, MD - Primary    * Almquist, Earlyne Iba, MD - Assisting  PHYSICIAN ASSISTANT:   ASSISTANTS: Almquist   ANESTHESIA:   local and general  EBL:  20 mL   BLOOD ADMINISTERED:none  DRAINS: Urinary Catheter (Foley)   LOCAL MEDICATIONS USED:  MARCAINE    and BUPIVICAINE   SPECIMEN:  Source of Specimen:  uterus cervix bilateral fallopian tubes  DISPOSITION OF SPECIMEN:  PATHOLOGY  COUNTS:  YES  TOURNIQUET:  * No tourniquets in log *  DICTATION: .Note written in EPIC  PLAN OF CARE: overnight observation  PATIENT DISPOSITION:  PACU - hemodynamically stable.   Delay start of Pharmacological VTE agent (>24hrs) due to surgical blood loss or risk of bleeding: yes   Procedure:Robotic-assisted total laparoscopic hysterectomy  Indications: This is a 42 year old G3 P2 with 2 prior cesarean sections and prior NovaSure endometrial ablation with persistent pelvic pain and concern for Asherman syndrome.  Procedure: After informed consent and discussion of alternatives to hysterectomy, the patient was taken to the operating room where general anesthesia was initiated without difficulty. She was prepped and draped in normal sterile fashion in the dorsal supine lithotomy position. A Foley catheter was inserted sterilely into the bladder. A bimanual examination was done to assess the size and position of the uterus. A weighted speculum was placed in the vagina. A vaginal wall retractor was used on the anterior vagina and  the cervix was grasped with tenaculum. The cervix was sounded with the uterine sound. It  sounded to 10 cm. The cervix was assessed to identify the Rumi-Co size. A suture was placed on the cervix. This was then threaded through the Rumi-Co. A medium cup and an 10 cm shaft was used. This was easily threaded through the cervix, into the uterus and the cup seated nicely around the cervix. The uterine balloon was inflated.  Attention was then turned to the patient's abdomen. A 8 mm incision was made in the umbilicus. The Verees needle was inserted into the abdomen. Intra-abdominal placement was confirmed by the use of the saline filled syringe and pneumoperitoneum was created to 38mm mercury.   The abdominal wall was assessed and a large omental adhesion was noted just inferior to the umbilicus. Of note a separation of the rectus diastases was also noted. The patient's abdominal wall externally was then marked with a marking pen for the planned port placement. 2 markings for ports were placed to the left of the umbilicus, the more medial  marking slightly cephalad. Ports were placed approximately 8 cm apart.Two markings for ports were made to the right of the umbilicus at the level of the umbilicus. These were also about 8 cm apart. A total of 20 cc of quarter percent Marcaine was used to infuse at the planned port entry sites. 8 mm skin incisions were made with the #11 blade. Three, Non-bladed robotic 8 mm ports were then placed under direct visualization. 8 mm Airseal/ assitant port was placed under direct visualization at the left medial port site. The robot was brought to the patient's side and attached with the right side docking. The robotic instruments were  placed under direct visualization until proper placement just over the uterus.  I then went to the robotic console. Brief survey of the patient's abdomen and pelvis revealed normal bilateral tubes and ovaries, normal uterus, normal liver edge, large midline omental adhesion.  Bilateral ureters were identified and seen peristalsing well low in  the pelvic sidewall. Bilateral tubes and ovaries were normal. I began by dissecting down the midline omental adhesion both for pain control and for adequate visualization throughout the case. This was done with monopolar cautery. Hemostasis was noted.  I then moved to the left side: the left tube was serially cauterized with bipolar cautery and transected with the monopolar scissors. The left utero-ovarian ligament was serially cauterized with the bipolar  and transected with the monopolar scissors. The round ligament was serially cauterized with bipolar and transected with the monopolar scissors. The anterior leaf of the broad ligament was dissected bluntly then monopolar cautery was used to separate the anterior and posterior leafs of the broad ligament. This was carried down through to the bladder flap. The third arm was used to elevate the bladder and assist in creating the bladder flap. Attention was then turned to the patient's right side: the right tube was cauterized and transected,  the right uterine ovarian ligament was serially cauterized  and transected with the monopolar scissors. The right round ligament was cauterized and divided.. The right broad ligament was opened up and the anterior and posterior leaves were dissected apart from each other. Monopolar cautery was used to come across the anterior and posterior leaf of the right broad ligament. This dissection was carried down across to the midline to create the bladder flap. Further sharp and blunt dissection was carried out to drop the bladder inferiorly. The right uterine artery was serially cauterized with the bipolar cautery and transected with the monopolar scissors. The left uterine artery and then serially cauterized with the bipolar cautery and transected with the monopolar scissors. The uterus was placed on stretch to allow better visualization of the arteries. The bladder flap was further taken down both sharply and with cautery.  At this  point with the pressure inward on the Rumi, the anterior colpotomy was made with the monopolar scissors.This was carried around to the patient's right then left side. Additional bipolar cautery was needed on both angles of the cuff- this was carried out with the bipolar. The uterus was then positioned anteriorly to allow easy access to the posterior colpotomy. This was carried out with the monopolar scissors. The posterior incision was carried around to both the right and left angles. Good hemostasis was noted . The colpotomy was completed. The uterus, cervix and bilateral fallopian tubes were then pushed out through the vaginal cuff.   Irrigation was used and the vaginal cuff appeared hemostatic. An 0  V locked suture on a CT-2 was placed through the assistant port. Using a suture cut needle driver and A Prograsp forceps and starting at the right angle of the incision the vaginal cuff was closed with running locked V locked suture. Once at the left angle several sutures were used to carried back through to the midline. The suture was cut and the needle with remaining suture was removed from the pelvis. A finger was placed into the vagina to palpate the vaginal cuff which was felt to be without defect. Excellent hemostasis was noted. Suction irrigation was carried out and hemostasis was assured along the vaginal cuff. The utero-ovarian ligaments were reassessed also found to be  hemostatic. All free fluid was removed from the abdomen. The robotic portion was completed. The robot was undocked. By standard laparoscopy the patient was placed in reverse Trendelenburg, all additional fluid was suctioned from the abdomen and pelvis the cuff was reinspected and  found to be hemostatic. All pedicles were also found to be hemostatic. 30 cc of quarter percent bupivacaine was drizzled over the ovarian pedicles and along the vaginal cuff. Under direct visualization the ports were removed. Pneumoperitoneum was released.   The  incisions were inspected and very small fascial defects were noted. The decision was made to not close the fascial defects due to the small size. A 4-0 Vicryl was used to close the laparoscopic ports sites. Good hemostasis was noted. The vagina was checked and no bleeding was noted.   Sponge lap and needle counts were correct x3 the patient was woken from general anesthesia having tolerated the procedure well and taken to the recovery room in a stable fashion.  Ala Dach 05/01/2018 10:27 AM

## 2018-05-01 NOTE — Progress Notes (Signed)
Pt ambulating with ease and unassisted thru PACU

## 2018-05-01 NOTE — Progress Notes (Signed)
Pt aware and ok VICTOZA not available, pt does not want INVOKANA at this time.  Pt will take metformin.

## 2018-05-01 NOTE — Brief Op Note (Signed)
05/01/2018  10:25 AM  PATIENT:  Carla Knight  42 y.o. female  PRE-OPERATIVE DIAGNOSIS:  Pelvic Pain  POST-OPERATIVE DIAGNOSIS:  Pelvic Pain  PROCEDURE:  Procedure(s) with comments: XI ROBOTIC ASSISTED LAPAROSCOPIC HYSTERECTOMY AND SALPINGECTOMY (Bilateral) - Requests 3 1/2 hrs. XI ROBOTIC ASSISTED LAPAROSCOPIC LYSIS OF ADHESION  SURGEON:  Surgeon(s) and Role:    * Aloha Gell, MD - Primary    * Almquist, Earlyne Iba, MD - Assisting  PHYSICIAN ASSISTANT:   ASSISTANTS: Almquist   ANESTHESIA:   local and general  EBL:  20 mL   BLOOD ADMINISTERED:none  DRAINS: Urinary Catheter (Foley)   LOCAL MEDICATIONS USED:  MARCAINE    and BUPIVICAINE   SPECIMEN:  Source of Specimen:  uterus cervix bilateral fallopian tubes  DISPOSITION OF SPECIMEN:  PATHOLOGY  COUNTS:  YES  TOURNIQUET:  * No tourniquets in log *  DICTATION: .Note written in EPIC  PLAN OF CARE: overnight observation  PATIENT DISPOSITION:  PACU - hemodynamically stable.   Delay start of Pharmacological VTE agent (>24hrs) due to surgical blood loss or risk of bleeding: yes

## 2018-05-01 NOTE — Transfer of Care (Signed)
Immediate Anesthesia Transfer of Care Note  Patient: Carla Knight  Procedure(s) Performed: XI ROBOTIC ASSISTED LAPAROSCOPIC HYSTERECTOMY AND SALPINGECTOMY (Bilateral ) XI ROBOTIC ASSISTED LAPAROSCOPIC LYSIS OF ADHESION  Patient Location: PACU  Anesthesia Type:General  Level of Consciousness: awake, alert  and oriented  Airway & Oxygen Therapy: Patient Spontanous Breathing and Patient connected to face mask oxygen  Post-op Assessment: Report given to RN and Post -op Vital signs reviewed and stable  Post vital signs: Reviewed and stable  Last Vitals:  Vitals Value Taken Time  BP 128/71 05/01/2018 10:21 AM  Temp    Pulse 116 05/01/2018 10:24 AM  Resp 22 05/01/2018 10:24 AM  SpO2 100 % 05/01/2018 10:24 AM  Vitals shown include unvalidated device data.  Last Pain:  Vitals:   05/01/18 0554  TempSrc:   PainSc: 0-No pain      Patients Stated Pain Goal: 4 (83/09/40 7680)  Complications: No apparent anesthesia complications

## 2018-05-01 NOTE — Anesthesia Postprocedure Evaluation (Signed)
Anesthesia Post Note  Patient: Carla Knight  Procedure(s) Performed: XI ROBOTIC ASSISTED LAPAROSCOPIC HYSTERECTOMY AND SALPINGECTOMY (Bilateral ) XI ROBOTIC ASSISTED LAPAROSCOPIC LYSIS OF ADHESION     Patient location during evaluation: PACU Anesthesia Type: General Level of consciousness: awake and alert Pain management: pain level controlled Vital Signs Assessment: post-procedure vital signs reviewed and stable Respiratory status: spontaneous breathing, nonlabored ventilation, respiratory function stable and patient connected to nasal cannula oxygen Cardiovascular status: blood pressure returned to baseline and stable Postop Assessment: no apparent nausea or vomiting Anesthetic complications: no    Last Vitals:  Vitals:   05/01/18 1145 05/01/18 1200  BP: 104/64   Pulse: (!) 118   Resp: 16   Temp: 37.4 C   SpO2: 94% 96%    Last Pain:  Vitals:   05/01/18 1145  TempSrc:   PainSc: 8                  Kennth Vanbenschoten DAVID

## 2018-05-01 NOTE — Progress Notes (Signed)
Pt ambulating with ease thru [facility].

## 2018-05-01 NOTE — Progress Notes (Signed)
Dr Valentino Saxon contacted by RN and updated: VSS, Pt tolerating PO's well, foley removed and pt voiding, pt ambulating with ease.  RN requesting PO pain medication and resumption of home meds.  Home meds reviewed with MD and orders placed.  Pharmacy reported McCoy not available in house, pt may supply own or just skip a day. Pharmacist reports he will verify home meds ordered by RN.

## 2018-05-02 ENCOUNTER — Encounter (HOSPITAL_COMMUNITY): Payer: Self-pay | Admitting: Obstetrics

## 2018-05-02 DIAGNOSIS — D251 Intramural leiomyoma of uterus: Secondary | ICD-10-CM | POA: Diagnosis not present

## 2018-05-02 LAB — CBC
HCT: 37 % (ref 36.0–46.0)
Hemoglobin: 12.4 g/dL (ref 12.0–15.0)
MCH: 28.5 pg (ref 26.0–34.0)
MCHC: 33.5 g/dL (ref 30.0–36.0)
MCV: 85.1 fL (ref 78.0–100.0)
PLATELETS: 302 10*3/uL (ref 150–400)
RBC: 4.35 MIL/uL (ref 3.87–5.11)
RDW: 13.4 % (ref 11.5–15.5)
WBC: 14 10*3/uL — AB (ref 4.0–10.5)

## 2018-05-02 MED ORDER — KETOROLAC TROMETHAMINE 30 MG/ML IJ SOLN
INTRAMUSCULAR | Status: AC
Start: 2018-05-02 — End: ?
  Filled 2018-05-02: qty 1

## 2018-05-02 MED ORDER — OXYCODONE-ACETAMINOPHEN 5-325 MG PO TABS: 1.0000 | ORAL_TABLET | ORAL | 0 refills | Status: DC | PRN

## 2018-05-02 NOTE — Discharge Summary (Signed)
Carla Knight MRN: [id number] DOB/AGE: 1976/10/30 42 y.o.  Admit date: 05/01/2018 Discharge date: 05/02/18  Admission Diagnoses: Pelvic Pain  Discharge Diagnoses: Pelvic Pain        Active Problems:   S/P hysterectomy   Discharged Condition: good  Hospital Course: Pt admitted for planned TLH/ b/l salpingectomy with robotic assist. WBC of 17 noted but no focal sx. Decision made to proceed with surgery, uncomplicated surgery. Several hours post-op pt requested to remove foley. She was up, walking and quickly progressed to regular diet. Her home diabetes meds were restarted. Pain was controlled with Toradol.  On HD#2/ POD #1 pt notes tolerating reg po, nl voids, + flatus, pain control. No HA, SOB, CP.  PE: Vitals:   05/01/18 1900 05/01/18 2300 05/02/18 0253 05/02/18 0622  BP: 101/68 93/64 97/65  117/77  Pulse: (!) 111 97 80 88  Resp: 16 16 16 17   Temp: 98 F (36.7 C) 98.8 F (37.1 C) 98 F (36.7 C) 98.7 F (37.1 C)  TempSrc: Oral Oral Oral   SpO2: 98% 98% 99% 99%  Weight:      Height:       CV RRR Gen well appearing, no distress Pulm CTAB Abd soft, appropriately tender Inc: C/D/I GU no staining LE no edema  CBC Latest Ref Rng & Units 05/02/2018 04/29/2018 05/06/2012  WBC 4.0 - 10.5 K/uL 14.0(H) 17.0(H) 13.8(H)  Hemoglobin 12.0 - 15.0 g/dL 12.4 15.8(H) 13.9  Hematocrit 36.0 - 46.0 % 37.0 45.5 40.8  Platelets 150 - 400 K/uL 302 408(H) 307     Consults: None  Treatments: surgery: robotic assisted TLH with bl salpingectomy and LOA  Disposition: Discharge disposition: 01-Home or Self Care        Allergies as of 05/02/2018      Reactions   Adhesive [tape] Hives      Medication List    TAKE these medications   atorvastatin 20 MG tablet Commonly known as:  LIPITOR Take 20 mg by mouth every evening.   CHILDRENS GUMMIES Chew Chew 2 tablets by mouth every evening.   glimepiride 1 MG tablet Commonly known as:  AMARYL Take 1 mg by mouth daily with lunch.    hydrochlorothiazide 25 MG tablet Commonly known as:  HYDRODIURIL Take 25 mg by mouth every morning.   ibuprofen 200 MG tablet Commonly known as:  ADVIL,MOTRIN Take 600-800 mg by mouth 2 (two) times daily as needed for moderate pain or cramping.   INVOKANA 300 MG Tabs tablet Generic drug:  canagliflozin Take 300 mg by mouth daily.   levothyroxine 200 MCG tablet Commonly known as:  SYNTHROID, LEVOTHROID Take 200 mcg by mouth See admin instructions. Take 200 mcg every morning except Sundays   levothyroxine 100 MCG tablet Commonly known as:  SYNTHROID Take 1 tablet (100 mcg total) by mouth daily.   liraglutide 18 MG/3ML Sopn Commonly known as:  VICTOZA Inject 1.8 mg into the skin every evening.   lisinopril 10 MG tablet Commonly known as:  PRINIVIL,ZESTRIL Take 10 mg by mouth every morning.   metFORMIN 500 MG 24 hr tablet Commonly known as:  GLUCOPHAGE-XR Take 2,000 mg by mouth every evening.   oxyCODONE-acetaminophen 5-325 MG tablet Commonly known as:  PERCOCET/ROXICET Take 1-2 tablets by mouth every 4 (four) hours as needed for moderate pain.        Signed: Ala Dach, MD 05/02/2018, 6:48 AM

## 2018-11-27 DIAGNOSIS — C73 Malignant neoplasm of thyroid gland: Secondary | ICD-10-CM | POA: Diagnosis not present

## 2018-11-27 DIAGNOSIS — E890 Postprocedural hypothyroidism: Secondary | ICD-10-CM | POA: Diagnosis not present

## 2018-12-01 DIAGNOSIS — E890 Postprocedural hypothyroidism: Secondary | ICD-10-CM | POA: Diagnosis not present

## 2018-12-01 DIAGNOSIS — C73 Malignant neoplasm of thyroid gland: Secondary | ICD-10-CM | POA: Diagnosis not present

## 2018-12-15 DIAGNOSIS — E119 Type 2 diabetes mellitus without complications: Secondary | ICD-10-CM | POA: Diagnosis not present

## 2018-12-21 DIAGNOSIS — E1169 Type 2 diabetes mellitus with other specified complication: Secondary | ICD-10-CM | POA: Diagnosis not present

## 2018-12-21 DIAGNOSIS — E785 Hyperlipidemia, unspecified: Secondary | ICD-10-CM | POA: Diagnosis not present

## 2018-12-21 DIAGNOSIS — I10 Essential (primary) hypertension: Secondary | ICD-10-CM | POA: Diagnosis not present

## 2019-01-04 DIAGNOSIS — R52 Pain, unspecified: Secondary | ICD-10-CM | POA: Diagnosis not present

## 2019-01-04 DIAGNOSIS — J028 Acute pharyngitis due to other specified organisms: Secondary | ICD-10-CM | POA: Diagnosis not present

## 2019-01-04 DIAGNOSIS — J029 Acute pharyngitis, unspecified: Secondary | ICD-10-CM | POA: Diagnosis not present

## 2021-02-16 ENCOUNTER — Telehealth: Payer: Commercial Managed Care - PPO | Admitting: Physician Assistant

## 2021-02-16 DIAGNOSIS — E119 Type 2 diabetes mellitus without complications: Secondary | ICD-10-CM | POA: Insufficient documentation

## 2021-02-16 DIAGNOSIS — E785 Hyperlipidemia, unspecified: Secondary | ICD-10-CM | POA: Insufficient documentation

## 2021-02-16 DIAGNOSIS — J019 Acute sinusitis, unspecified: Secondary | ICD-10-CM

## 2021-02-16 DIAGNOSIS — E669 Obesity, unspecified: Secondary | ICD-10-CM | POA: Insufficient documentation

## 2021-02-16 DIAGNOSIS — I10 Essential (primary) hypertension: Secondary | ICD-10-CM | POA: Insufficient documentation

## 2021-02-16 MED ORDER — AMOXICILLIN-POT CLAVULANATE 875-125 MG PO TABS: 1.0000 | ORAL_TABLET | Freq: Two times a day (BID) | ORAL | 0 refills | Status: DC

## 2021-02-16 NOTE — Progress Notes (Signed)

## 2021-10-31 ENCOUNTER — Encounter: Payer: Self-pay | Admitting: Internal Medicine

## 2021-10-31 ENCOUNTER — Other Ambulatory Visit: Payer: Self-pay

## 2021-10-31 ENCOUNTER — Ambulatory Visit (INDEPENDENT_AMBULATORY_CARE_PROVIDER_SITE_OTHER): Payer: Commercial Managed Care - PPO | Admitting: Internal Medicine

## 2021-10-31 VITALS — BP 112/72 | HR 95 | Ht 63.0 in | Wt 166.2 lb

## 2021-10-31 DIAGNOSIS — E890 Postprocedural hypothyroidism: Secondary | ICD-10-CM | POA: Insufficient documentation

## 2021-10-31 DIAGNOSIS — Z85850 Personal history of malignant neoplasm of thyroid: Secondary | ICD-10-CM

## 2021-10-31 LAB — TSH: TSH: 0.69 u[IU]/mL (ref 0.35–5.50)

## 2021-10-31 NOTE — Progress Notes (Signed)
Name: Carla Knight  MRN/ DOB: [id number], 1976-08-18    Age/ Sex: 45 y.o., female    PCP: Carla Perches, MD   Reason for Endocrinology Evaluation: PTC/Hypothyroidism     Date of Initial Endocrinology Evaluation: 10/31/2021     HPI: Carla Knight is a 45 y.o. female with a past medical history of T2DM, Postoperative Hypothyroidism and HTN. The patient presented for initial endocrinology clinic visit on 10/31/2021 for consultative assistance with her PTC/Hypothyroidism.   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  S/P Thyrogen - stimulated RAI  of 54.8 mCi   Post-radioiodine whole body scan on 08/03/12 showed findings consistent with residual functioning thyroid tissue within the thyroid bed, with no evidence for distant metastatic disease.    S/P excision of benign  right parotid mass 12/17/2011   Denies local neck symptoms  Denies constipation or diarrhea  Denies palpitations   Synthroid 200 mcg daily except Sundays   Mother with thyroid disease   HISTORY:  Past Medical History:  Past Medical History:  Diagnosis Date   Diabetes mellitus    NIDDM, type 2    Dyslipidemia    Goiter    no current meds.   Hypertension    under control; has been on med. x 5-6 yrs.   Hypothyroidism    Papillary thyroid carcinoma (HCC)    incidentally found during total thyroidectomy , neegative for metastasis   Parotid mass 11/2011   right   PONV (postoperative nausea and vomiting)    nauseated when ate after surgery   Thyroid mass    Past Surgical History:  Past Surgical History:  Procedure Laterality Date   ABLATION  2016 or 2017 unsure   obgyn dr Ernestina Penna    CESAREAN SECTION  10/18/2008   CESAREAN SECTION W/BTL  04/08/2011   LAPAROSCOPIC CHOLECYSTECTOMY  06/05/2009   PAROTIDECTOMY  12/17/2011   Procedure: PAROTIDECTOMY;  Surgeon: Darletta Moll, MD;  Location: [facility];  Service: ENT;   Laterality: Right;  Total right parotidectomy   ROBOTIC ASSISTED LAPAROSCOPIC HYSTERECTOMY AND SALPINGECTOMY Bilateral 05/01/2018   Procedure: XI ROBOTIC ASSISTED LAPAROSCOPIC HYSTERECTOMY AND SALPINGECTOMY;  Surgeon: Noland Fordyce, MD;  Location: [facility];  Service: Gynecology;  Laterality: Bilateral;  Requests 3 1/2 hrs.   ROBOTIC ASSISTED LAPAROSCOPIC LYSIS OF ADHESION  05/01/2018   Procedure: XI ROBOTIC ASSISTED LAPAROSCOPIC LYSIS OF ADHESION;  Surgeon: Noland Fordyce, MD;  Location: [facility];  Service: Gynecology;;   THYROIDECTOMY  05/13/2012   Procedure: THYROIDECTOMY;  Surgeon: Darletta Moll, MD;  Location: [facility];  Service: ENT;  Laterality: Bilateral;  TOTAL THYROIDECTOMY    Social History:  reports that she has been smoking cigarettes. She has a 15.00 pack-year smoking history. She has never used smokeless tobacco. She reports current alcohol use of about 2.0 standard drinks per week. She reports that she does not use drugs. Family History: family history is not on file.   HOME MEDICATIONS: Allergies as of 10/31/2021       Reactions   Adhesive [tape] Hives        Medication List        Accurate as of October 31, 2021  7:23 AM. If you have any questions, ask your nurse or doctor.          amoxicillin-clavulanate 875-125 MG tablet Commonly known as: Augmentin Take 1 tablet by mouth 2 (two) times daily. One po  bid x 7 days   atorvastatin 20 MG tablet Commonly known as: LIPITOR Take 20 mg by mouth every evening.   Childrens Gummies Apison 2 tablets by mouth every evening.   glimepiride 1 MG tablet Commonly known as: AMARYL Take 1 mg by mouth daily with lunch.   hydrochlorothiazide 25 MG tablet Commonly known as: HYDRODIURIL Take 25 mg by mouth every morning.   ibuprofen 200 MG tablet Commonly known as: ADVIL Take 600-800 mg by mouth 2 (two) times daily as needed for moderate pain or cramping.   Invokana 300 MG Tabs tablet Generic drug: canagliflozin Take 300  mg by mouth daily.   levothyroxine 200 MCG tablet Commonly known as: SYNTHROID Take 200 mcg by mouth See admin instructions. Take 200 mcg every morning except Sundays   levothyroxine 100 MCG tablet Commonly known as: Synthroid Take 1 tablet (100 mcg total) by mouth daily.   liraglutide 18 MG/3ML Sopn Commonly known as: VICTOZA Inject 1.8 mg into the skin every evening.   lisinopril 10 MG tablet Commonly known as: ZESTRIL Take 10 mg by mouth every morning.   metFORMIN 500 MG 24 hr tablet Commonly known as: GLUCOPHAGE-XR Take 2,000 mg by mouth every evening.   oxyCODONE-acetaminophen 5-325 MG tablet Commonly known as: PERCOCET/ROXICET Take 1-2 tablets by mouth every 4 (four) hours as needed for moderate pain.          REVIEW OF SYSTEMS: A comprehensive ROS was conducted with the patient and is negative except as per HPI     OBJECTIVE:  VS: BP 112/72 (BP Location: Left Arm, Patient Position: Sitting, Cuff Size: Small)    Pulse 95    Ht $R'5\' 3"'jy$  (1.6 m)    Wt 166 lb 3.2 oz (75.4 kg)    SpO2 99%    BMI 29.44 kg/m    Wt Readings from Last 3 Encounters:  05/01/18 168 lb (76.2 kg)  04/29/18 168 lb (76.2 kg)  07/21/14 184 lb 14.4 oz (83.9 kg)     EXAM: General: Pt appears well and is in NAD  Neck: General: Supple without adenopathy. Thyroid: Thyroid size normal.  No goiter or nodules appreciated. No thyroid bruit.  Lungs: Clear with good BS bilat with no rales, rhonchi, or wheezes  Heart: Auscultation: RRR.  Abdomen: Normoactive bowel sounds, soft, nontender, without masses or organomegaly palpable  Extremities:  BL LE: No pretibial edema normal ROM and strength.  Mental Status: Judgment, insight: Intact Orientation: Oriented to time, place, and person Mood and affect: No depression, anxiety, or agitation     DATA REVIEWED:  Latest Reference Range & Units 10/31/21 10:30  TSH 0.35 - 5.50 uIU/mL 0.69    Old records , labs and images have been reviewed.     ASSESSMENT/PLAN/RECOMMENDATIONS:   Hx of PTC , Stage I :  - She is S/P total thyroidectomy in 04/2012 with a 4 cm PTC , follicular variant. She is S/P remnant ablation with 54.8 mCi of I-131  - Undetermined structural and biochemical response at this time  - Will proceed with Tg and Tg Ab  - Will proceed with thyroid bed ultrasound - TSH goal 0.5-2.0 uIU/mL for now      2. Postoperative Hypothyroidism :  - Pt is clinically euthyroid  - No local neck symptoms  - TSH at goal  -- Pt educated extensively on the correct way to take levothyroxine (first thing in the morning with water, 30 minutes before eating or taking other medications). - Pt encouraged  to double dose the following day if she were to miss a dose given long half-life of levothyroxine. - She is on Bran name Synthroid and gets this from [facility]  Medications : Continue Synthroid 200 mcg , 6 days a week   3. T2DM : This is managed by her PCP     F/U in 6 months   Signed electronically by: Mack Guise, MD  [facility]  [facility] [address] [place], [place] [address] Phone: [phone] FAX: [phone]   CC: Carla Knight, [address] [place] [address] Phone: [phone] Fax: [phone]   Return to Endocrinology clinic as below: Future Appointments  Date Time Provider [place]  10/31/2021  9:50 AM Jhayden Demuro, Melanie Crazier, MD [facility] None

## 2021-10-31 NOTE — Patient Instructions (Signed)
You are on Synthroid  - which is your thyroid hormone supplement. You MUST take this consistently.  You should take this first thing in the morning on an empty stomach with water. You should not take it with other medications. Wait 38min to 1hr prior to eating. If you are taking any vitamins - please take these in the evening.   If you miss a dose, please take your missed dose the following day (double the dose for that day). You should have a pill box for ONLY Synthroid  on your bedside table to help you remember to take your medications.

## 2021-11-01 ENCOUNTER — Encounter: Payer: Self-pay | Admitting: Internal Medicine

## 2021-11-01 LAB — THYROGLOBULIN ANTIBODY: Thyroglobulin Ab: <1 IU/mL (ref <=1)

## 2021-11-01 LAB — THYROGLOBULIN LEVEL: Thyroglobulin: <0.1 ng/mL — ABNORMAL LOW

## 2021-11-01 MED ORDER — SYNTHROID 200 MCG PO TABS: 200.0000 ug | ORAL_TABLET | Freq: Every day | ORAL | 3 refills | Status: DC

## 2021-11-21 ENCOUNTER — Ambulatory Visit
Admission: RE | Admit: 2021-11-21 | Discharge: 2021-11-21 | Disposition: A | Discharge status: Home / Self Care | Payer: Commercial Managed Care - PPO | Source: Ambulatory Visit | Attending: Internal Medicine | Admitting: Internal Medicine

## 2021-11-21 DIAGNOSIS — Z85850 Personal history of malignant neoplasm of thyroid: Secondary | ICD-10-CM

## 2022-02-05 ENCOUNTER — Other Ambulatory Visit: Payer: Self-pay

## 2022-02-05 MED ORDER — SYNTHROID 200 MCG PO TABS: 200.0000 ug | ORAL_TABLET | Freq: Every day | ORAL | 3 refills | Status: DC

## 2022-04-16 ENCOUNTER — Other Ambulatory Visit: Payer: Self-pay

## 2022-04-16 ENCOUNTER — Encounter (HOSPITAL_BASED_OUTPATIENT_CLINIC_OR_DEPARTMENT_OTHER): Payer: Self-pay

## 2022-04-16 ENCOUNTER — Ambulatory Visit
Admission: EM | Admit: 2022-04-16 | Discharge: 2022-04-16 | Disposition: A | Discharge status: Home / Self Care | Payer: Commercial Managed Care - PPO | Attending: Family Medicine | Admitting: Family Medicine

## 2022-04-16 ENCOUNTER — Emergency Department (HOSPITAL_BASED_OUTPATIENT_CLINIC_OR_DEPARTMENT_OTHER)
Admission: EM | Admit: 2022-04-16 | Discharge: 2022-04-16 | Disposition: A | Discharge status: Home / Self Care | Payer: Commercial Managed Care - PPO | Attending: Emergency Medicine | Admitting: Emergency Medicine

## 2022-04-16 ENCOUNTER — Emergency Department (HOSPITAL_BASED_OUTPATIENT_CLINIC_OR_DEPARTMENT_OTHER): Payer: Commercial Managed Care - PPO | Admitting: Radiology

## 2022-04-16 ENCOUNTER — Encounter: Payer: Self-pay | Admitting: Emergency Medicine

## 2022-04-16 DIAGNOSIS — I10 Essential (primary) hypertension: Secondary | ICD-10-CM | POA: Diagnosis not present

## 2022-04-16 DIAGNOSIS — D72829 Elevated white blood cell count, unspecified: Secondary | ICD-10-CM | POA: Insufficient documentation

## 2022-04-16 DIAGNOSIS — R200 Anesthesia of skin: Secondary | ICD-10-CM | POA: Insufficient documentation

## 2022-04-16 DIAGNOSIS — R0789 Other chest pain: Secondary | ICD-10-CM | POA: Insufficient documentation

## 2022-04-16 DIAGNOSIS — E039 Hypothyroidism, unspecified: Secondary | ICD-10-CM | POA: Insufficient documentation

## 2022-04-16 DIAGNOSIS — E119 Type 2 diabetes mellitus without complications: Secondary | ICD-10-CM | POA: Insufficient documentation

## 2022-04-16 DIAGNOSIS — Z7984 Long term (current) use of oral hypoglycemic drugs: Secondary | ICD-10-CM | POA: Diagnosis not present

## 2022-04-16 DIAGNOSIS — Z79899 Other long term (current) drug therapy: Secondary | ICD-10-CM | POA: Insufficient documentation

## 2022-04-16 DIAGNOSIS — R6884 Jaw pain: Secondary | ICD-10-CM

## 2022-04-16 DIAGNOSIS — R079 Chest pain, unspecified: Secondary | ICD-10-CM

## 2022-04-16 LAB — URINALYSIS, ROUTINE W REFLEX MICROSCOPIC
Bilirubin Urine: NEGATIVE
Glucose, UA: >1000 mg/dL — AB
Hgb urine dipstick: NEGATIVE
Ketones, ur: NEGATIVE mg/dL
Leukocytes,Ua: NEGATIVE
Nitrite: NEGATIVE
Protein, ur: NEGATIVE mg/dL
Specific Gravity, Urine: <1.005 — ABNORMAL LOW (ref 1.005–1.030)
pH: 5 (ref 5.0–8.0)

## 2022-04-16 LAB — CBC
HCT: 45.1 % (ref 36.0–46.0)
Hemoglobin: 14.3 g/dL (ref 12.0–15.0)
MCH: 27.3 pg (ref 26.0–34.0)
MCHC: 31.7 g/dL (ref 30.0–36.0)
MCV: 86.2 fL (ref 80.0–100.0)
Platelets: 253 10*3/uL (ref 150–400)
RBC: 5.23 MIL/uL — ABNORMAL HIGH (ref 3.87–5.11)
RDW: 13.7 % (ref 11.5–15.5)
WBC: 15.1 10*3/uL — ABNORMAL HIGH (ref 4.0–10.5)
nRBC: 0 % (ref 0.0–0.2)

## 2022-04-16 LAB — BASIC METABOLIC PANEL
Anion gap: 14 (ref 5–15)
BUN: 13 mg/dL (ref 6–20)
CO2: 20 mmol/L — ABNORMAL LOW (ref 22–32)
Calcium: 8.9 mg/dL (ref 8.9–10.3)
Chloride: 101 mmol/L (ref 98–111)
Creatinine, Ser: 0.57 mg/dL (ref 0.44–1.00)
GFR, Estimated: >60 mL/min (ref >60)
Glucose, Bld: 110 mg/dL — ABNORMAL HIGH (ref 70–99)
Potassium: 4.3 mmol/L (ref 3.5–5.1)
Sodium: 135 mmol/L (ref 135–145)

## 2022-04-16 LAB — PREGNANCY, URINE: Preg Test, Ur: NEGATIVE

## 2022-04-16 LAB — TROPONIN I (HIGH SENSITIVITY): Troponin I (High Sensitivity): 2 ng/L (ref <18)

## 2022-04-16 NOTE — ED Provider Notes (Signed)
[facility] Provider Note   CSN: [id number] Arrival date & time: 04/16/22  1119     History  Chief Complaint  Patient presents with   Chest Pain    Carla Knight is a 46 y.o. female who presents the emergency department complaining of chest discomfort starting early Monday morning around 1:30 AM.  Patient states that she stayed up late to do some computer work, and reports feeling "off in her chest".  This lasted about 30 minutes before it resolved on its own and she was able to go to sleep.  She states that that sensation radiated down into her left arm and left hand, and described it as numbness/tingling.  She had a similar sensation occurring last night, that radiated up into her bilateral neck/jaw and shoulders.  She denies cardiac history.  She reports increased stress with her job as of recently and is unsure if her symptoms could be related to anxiety.   Chest Pain Associated symptoms: numbness   Associated symptoms: no abdominal pain, no cough, no fever, no nausea, no palpitations, no shortness of breath, no vomiting and no weakness       Home Medications Prior to Admission medications   Medication Sig Start Date End Date Taking? Authorizing Provider  atorvastatin (LIPITOR) 20 MG tablet Take 20 mg by mouth every evening.     [provider]  glimepiride (AMARYL) 1 MG tablet Take 1 mg by mouth daily with lunch.    [provider]  hydrochlorothiazide (HYDRODIURIL) 25 MG tablet Take 25 mg by mouth every morning.     [provider]  ibuprofen (ADVIL,MOTRIN) 200 MG tablet Take 600-800 mg by mouth 2 (two) times daily as needed for moderate pain or cramping.     [provider]  JARDIANCE 25 MG TABS tablet Take 25 mg by mouth daily. 10/06/21   [provider]  lisinopril (PRINIVIL,ZESTRIL) 10 MG tablet Take 10 mg by mouth every morning.     [provider]  metFORMIN (GLUCOPHAGE) 1000 MG tablet  Take 1,000 mg by mouth 2 (two) times daily. 10/23/21   [provider]  Pediatric Multivit-Minerals-C (CHILDRENS GUMMIES) CHEW Chew 2 tablets by mouth every evening.    [provider]  SYNTHROID 200 MCG tablet Take 1 tablet (200 mcg total) by mouth daily before breakfast. 02/05/22   Shamleffer, Melanie Crazier, MD  TRULICITY 1.5 DD/2.2GU SOPN Inject 1.5 mg into the skin once a week. 10/06/21   [provider]      Allergies    Adhesive [tape]    Review of Systems   Review of Systems  Constitutional:  Negative for fever.  Respiratory:  Negative for cough and shortness of breath.   Cardiovascular:  Positive for chest pain. Negative for palpitations and leg swelling.  Gastrointestinal:  Negative for abdominal pain, nausea and vomiting.  Neurological:  Positive for numbness. Negative for weakness.  All other systems reviewed and are negative.  Physical Exam Updated Vital Signs BP 129/86 (BP Location: Right Arm)   Pulse 90   Temp 98.2 F (36.8 C) (Oral)   Resp 17   SpO2 100%  Physical Exam Vitals and nursing note reviewed.  Constitutional:      Appearance: Normal appearance.  HENT:     Head: Normocephalic and atraumatic.  Eyes:     Conjunctiva/sclera: Conjunctivae normal.  Cardiovascular:     Rate and Rhythm: Normal rate and regular rhythm.     Pulses:  Posterior tibial pulses are 2+ on the right side and 2+ on the left side.  Pulmonary:     Effort: Pulmonary effort is normal. No respiratory distress.     Breath sounds: Normal breath sounds.  Abdominal:     General: There is no distension.     Palpations: Abdomen is soft.     Tenderness: There is no abdominal tenderness.  Musculoskeletal:     Right lower leg: No edema.     Left lower leg: No edema.  Skin:    General: Skin is warm and dry.  Neurological:     General: No focal deficit present.     Mental Status: She is alert.    ED Results / Procedures / Treatments   Labs (all labs  ordered are listed, but only abnormal results are displayed) Labs Reviewed  BASIC METABOLIC PANEL - Abnormal; Notable for the following components:      Result Value   CO2 20 (*)    Glucose, Bld 110 (*)    All other components within normal limits  CBC - Abnormal; Notable for the following components:   WBC 15.1 (*)    RBC 5.23 (*)    All other components within normal limits  URINALYSIS, ROUTINE W REFLEX MICROSCOPIC - Abnormal; Notable for the following components:   Color, Urine COLORLESS (*)    Specific Gravity, Urine <1.005 (*)    Glucose, UA >1,000 (*)    Bacteria, UA RARE (*)    All other components within normal limits  PREGNANCY, URINE  TROPONIN I (HIGH SENSITIVITY)  TROPONIN I (HIGH SENSITIVITY)    EKG None  Radiology DG Chest 2 View  Result Date: 04/16/2022 CLINICAL DATA:  Chest pain EXAM: CHEST - 2 VIEW COMPARISON:  05/06/2012 FINDINGS: The cardiac silhouette, mediastinal and hilar contours are within normal limits. The lungs are clear. No pleural effusions. No pulmonary lesions. The bony thorax is intact. IMPRESSION: No acute cardiopulmonary findings. Electronically Signed   By: Marijo Sanes M.D.   On: 04/16/2022 12:20    Procedures Procedures    Medications Ordered in ED Medications - No data to display  ED Course/ Medical Decision Making/ A&P                           Medical Decision Making Amount and/or Complexity of Data Reviewed Labs: ordered. Radiology: ordered.  This patient is a 46 y.o. female who presents to the ED for concern of chest pain, this involves an extensive number of treatment options, and is a complaint that carries with it a high risk of complications and morbidity. The emergent differential diagnosis prior to evaluation includes, but is not limited to,  ACS, pericarditis, aortic dissection, PE, pneumothorax, esophageal spasm or rupture, chronic angina, valvular disease, cardiomyopathy, myocarditis, pulmonary HTN, pneumonia, bronchitis,  GERD, reflux/PUD, biliary disease, pancreatitis, disk disease, costochondritis, anxiety or panic attack .  This is not an exhaustive differential.   Past Medical History / Co-morbidities / Social History: Diabetes, HLD, HTN, hypothyroidism  Additional history: Chart reviewed. Pertinent results include: Patient went to urgent care this morning and had normal EKG.  Was recommended to go to the emergency department to rule out cardiac etiology.  Physical Exam: Physical exam performed. The pertinent findings include: Normal vital signs.  Lung sounds clear to auscultation all fields, with good oxygen saturation.  Lab Tests: I ordered, and personally interpreted labs.  The pertinent results include:  leukocytosis of 15.1, likely  incidental as patient has no infectious symptoms.  Electrolytes within normal limits.  Troponin of 2.   Imaging Studies: I ordered imaging studies including chest x-ray. I independently visualized and interpreted imaging which showed no acute cardiopulmonary abnormalities. I agree with the radiologist interpretation.   Cardiac Monitoring:  The patient was maintained on a cardiac monitor.  My attending physician Dr. Gilford Raid viewed and interpreted the cardiac monitored which showed an underlying rhythm of: normal sinus rhythm. I agree with this interpretation.   Disposition: After consideration of the diagnostic results and the patients response to treatment, I feel that patient is to be discharged with recommendation to follow up with PCP in regards to today's hospital visit. Chest pain is not likely of cardiac or pulmonary etiology d/t presentation, PERC criteria for PE negative, VSS, no tracheal deviation, no JVD or new murmur, RRR, breath sounds equal bilaterally, EKG without acute abnormalities, negative troponin, and negative CXR. Heart score of 3. Pt has been advised to return to the ED if CP becomes exertional, associated with diaphoresis or nausea, worsens or becomes  concerning in any way. Pt appears reliable for follow up and is agreeable to discharge.    Final Clinical Impression(s) / ED Diagnoses Final diagnoses:  Chest pain, unspecified type    Rx / DC Orders ED Discharge Orders     None      Portions of this report may have been transcribed using voice recognition software. Every effort was made to ensure accuracy; however, inadvertent computerized transcription errors may be present.    Estill Cotta 04/16/22 1538    Isla Pence, MD 04/16/22 [phone]

## 2022-04-16 NOTE — ED Triage Notes (Signed)
Pt reports has had bilateral jaw pain, left arm numbness, and overall "nervousness in upper torso since Saturday. Pt reports x2 episodes that lasted approximately 30 minutes. Pt denies any nausea, chest pain, shortness of breath during episodes and denies any pain or discomfort at this time.   Pt reports called pcp and reports was unable to see pt today and states was told to [facility] or ED for further evaluation.

## 2022-04-16 NOTE — ED Triage Notes (Signed)
Pt sent here by [facility] for further evaluation of "feeling off in her chest" on Monday morning approx 0130 am. Pt reports it radiated down into her Left arm into her Left hand. Pt describes her arm discomfort as a numbness/tingling feeling.  Pain has been intermittent, generalized chest discomfort radiating up into bilateral neck and shoulders but radiating down into Left arm.   Pt reports increase stress with her job. Pt works wit Social services, Airline pilot

## 2022-04-16 NOTE — Discharge Instructions (Addendum)
You were seen in the emergency department today for chest pain.  As we discussed your lab work, EKG, chest x-ray all looked reassuring today.  I think that your symptoms could likely been related to anxiety/increased stress.   I recommend monitoring your stress levels.  Follow up with your PCP if your symptoms continue.   Return to the emergency department for any new or worsening symptoms such as: Worsening pain or pain with exertion, difficulty breathing, sweating, or pain or swelling in your legs.

## 2022-04-16 NOTE — ED Provider Notes (Signed)
[facility]    CSN: [id number] Arrival date & time: 04/16/22  0959      History   Chief Complaint Chief Complaint  Patient presents with   Jaw Pain    HPI Carla Knight is a 46 y.o. female.   Patient presenting today with 2-day history of intermittent episodes of waves of chest heaviness, bilateral jaw pain, left arm numbness.  States the first episode was the worst and lasted about 30 minutes 2 days ago, since has had much shorter "waves "of symptoms, over her that seem to be self resolving.  She denies any nausea, vomiting, chest pain, shortness of breath, dizziness, headache, diaphoresis with episodes.  She does not have any history of anxiety or panic episodes but states she is always under a significant amount of stress.  She has a past medical history significant for hyperlipidemia, hypertension, type 2 diabetes.   Past Medical History:  Diagnosis Date   Diabetes mellitus    NIDDM, type 2    Dyslipidemia    Goiter    no current meds.   Hypertension    under control; has been on med. x 5-6 yrs.   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   PONV (postoperative nausea and vomiting)    nauseated when ate after surgery   Thyroid mass     Patient Active Problem List   Diagnosis Date Noted   Postoperative hypothyroidism 10/31/2021   Hx of papillary thyroid carcinoma 10/31/2021   Dyslipidemia 02/16/2021   Obesity 02/16/2021   Primary hypertension 02/16/2021   T2DM (type 2 diabetes mellitus) ([place]) 02/16/2021   S/P hysterectomy 05/01/2018   Hypothyroidism, postsurgical 04/11/2017   Thyroid cancer ([place]) 04/11/2017    Past Surgical History:  Procedure Laterality Date   ABLATION  2016 or 2017 unsure   obgyn dr Pamala Hurry    CESAREAN SECTION  10/18/2008   CESAREAN SECTION W/BTL  04/08/2011   LAPAROSCOPIC CHOLECYSTECTOMY  06/05/2009    PAROTIDECTOMY  12/17/2011   Procedure: PAROTIDECTOMY;  Surgeon: Ascencion Dike, MD;  Location: [facility];  Service: ENT;  Laterality: Right;  Total right parotidectomy   ROBOTIC ASSISTED LAPAROSCOPIC HYSTERECTOMY AND SALPINGECTOMY Bilateral 05/01/2018   Procedure: XI ROBOTIC ASSISTED LAPAROSCOPIC HYSTERECTOMY AND SALPINGECTOMY;  Surgeon: Aloha Gell, MD;  Location: [facility];  Service: Gynecology;  Laterality: Bilateral;  Requests 3 1/2 hrs.   ROBOTIC ASSISTED LAPAROSCOPIC LYSIS OF ADHESION  05/01/2018   Procedure: XI ROBOTIC ASSISTED LAPAROSCOPIC LYSIS OF ADHESION;  Surgeon: Aloha Gell, MD;  Location: [facility];  Service: Gynecology;;   THYROIDECTOMY  05/13/2012   Procedure: THYROIDECTOMY;  Surgeon: Ascencion Dike, MD;  [facility];  Service: ENT;  Laterality: Bilateral;  TOTAL THYROIDECTOMY    OB History   No obstetric history on file.      Home Medications    Prior to Admission medications   Medication Sig Start Date End Date Taking? Authorizing Provider  atorvastatin (LIPITOR) 20 MG tablet Take 20 mg by mouth every evening.    Yes [provider]  glimepiride (AMARYL) 1 MG tablet Take 1 mg by mouth daily with lunch.   Yes [provider]  hydrochlorothiazide (HYDRODIURIL) 25 MG tablet Take 25 mg by mouth every morning.    Yes [provider]  ibuprofen (ADVIL,MOTRIN) 200 MG tablet Take 600-800 mg by mouth 2 (two) times  daily as needed for moderate pain or cramping.    Yes [provider]  JARDIANCE 25 MG TABS tablet Take 25 mg by mouth daily. 10/06/21  Yes [provider]  lisinopril (PRINIVIL,ZESTRIL) 10 MG tablet Take 10 mg by mouth every morning.    Yes [provider]  metFORMIN (GLUCOPHAGE) 1000 MG tablet Take 1,000 mg by mouth 2 (two) times daily. 10/23/21  Yes [provider]  Pediatric Multivit-Minerals-C (CHILDRENS GUMMIES) CHEW Chew 2 tablets by mouth every evening.   Yes [provider]   SYNTHROID 200 MCG tablet Take 1 tablet (200 mcg total) by mouth daily before breakfast. 02/05/22  Yes Shamleffer, Melanie Crazier, MD  TRULICITY 1.5 JI/9.6VE SOPN Inject 1.5 mg into the skin once a week. 10/06/21  Yes [provider]    Family History History reviewed. No pertinent family history.  Social History Social History   Tobacco Use   Smoking status: Some Days    Packs/day: 1.00    Years: 15.00    Pack years: 15.00    Types: Cigarettes   Smokeless tobacco: Never   Tobacco comments:    "im a casual mosker, every now and then"   Substance Use Topics   Alcohol use: Yes    Alcohol/week: 2.0 standard drinks    Types: 2 Glasses of wine per week    Comment: occasionally   Drug use: No     Allergies   Adhesive [tape]   Review of Systems Review of Systems Per HPI  Physical Exam Triage Vital Signs ED Triage Vitals  Enc Vitals Group     BP 04/16/22 1015 (!) 145/91     Pulse Rate 04/16/22 1015 (!) 101     Resp 04/16/22 1015 18     Temp 04/16/22 1015 98.2 F (36.8 C)     Temp Source 04/16/22 1015 Oral     SpO2 04/16/22 1015 97 %     Weight 04/16/22 1016 166 lb (75.3 kg)     Height 04/16/22 1016 '5\' 3"'$  (1.6 m)     Head Circumference --      Peak Flow --      Pain Score 04/16/22 1016 0     Pain Loc --      Pain Edu? --      Excl. in [place]? --    No data found.  Updated Vital Signs BP (!) 145/91 (BP Location: Right Arm)   Pulse (!) 101   Temp 98.2 F (36.8 C) (Oral)   Resp 18   Ht '5\' 3"'$  (1.6 m)   Wt 166 lb (75.3 kg)   SpO2 97%   BMI 29.41 kg/m   Visual Acuity Right Eye Distance:   Left Eye Distance:   Bilateral Distance:    Right Eye Near:   Left Eye Near:    Bilateral Near:     Physical Exam Vitals and nursing note reviewed.  Constitutional:      Appearance: Normal appearance. She is not ill-appearing.  HENT:     Head: Atraumatic.     Mouth/Throat:     Mouth: Mucous membranes are moist.  Eyes:     Extraocular Movements:  Extraocular movements intact.     Conjunctiva/sclera: Conjunctivae normal.  Cardiovascular:     Rate and Rhythm: Normal rate and regular rhythm.     Heart sounds: Normal heart sounds.  Pulmonary:     Effort: Pulmonary effort is normal.     Breath sounds: Normal breath sounds. No wheezing  or rales.  Musculoskeletal:        General: No swelling, tenderness, deformity or signs of injury. Normal range of motion.     Cervical back: Normal range of motion and neck supple.     Comments: No midline spinal tenderness to palpation diffusely.  Grip strength full and equal bilateral upper extremities.  Good range of motion diffusely.  Skin:    General: Skin is warm and dry.  Neurological:     Mental Status: She is alert and oriented to person, place, and time.     Sensory: No sensory deficit.     Motor: No weakness.     Gait: Gait normal.  Psychiatric:        Mood and Affect: Mood normal.        Thought Content: Thought content normal.        Judgment: Judgment normal.     UC Treatments / Results  Labs (all labs ordered are listed, but only abnormal results are displayed) Labs Reviewed - No data to display  EKG   Radiology No results found.  Procedures Procedures (including critical care time)  Medications Ordered in UC Medications - No data to display  Initial Impression / Assessment and Plan / UC Course  I have reviewed the triage vital signs and the nursing notes.  Pertinent labs & imaging results that were available during my care of the patient were reviewed by me and considered in my medical decision making (see chart for details).     Minimally hypertensive and tachycardic in triage, otherwise vital signs reassuring.  Exam very reassuring with no obvious other causation of her physical symptoms.  Discussed with patient that while this could represent new panic episodes, cannot rule out cardiac issue in this setting despite stable EKG from previous showing normal sinus  rhythm at 90 bpm with no new ST or T wave changes compared to previous.  She is agreeable and wishes to go to the emergency department.  She will call her husband to take her via private vehicle.  She is hemodynamically stable for transport at this time.  Final Clinical Impressions(s) / UC Diagnoses   Final diagnoses:  Left arm numbness  Jaw pain  Chest heaviness   Discharge Instructions   None    ED Prescriptions   None    PDMP not reviewed this encounter.   Volney American, [place] 04/16/22 1051

## 2022-05-06 ENCOUNTER — Ambulatory Visit (INDEPENDENT_AMBULATORY_CARE_PROVIDER_SITE_OTHER): Payer: Commercial Managed Care - PPO | Admitting: Internal Medicine

## 2022-05-06 ENCOUNTER — Ambulatory Visit: Payer: Commercial Managed Care - PPO | Admitting: Internal Medicine

## 2022-05-06 ENCOUNTER — Encounter: Payer: Self-pay | Admitting: Internal Medicine

## 2022-05-06 VITALS — BP 110/68 | HR 89 | Ht 63.0 in | Wt 168.6 lb

## 2022-05-06 DIAGNOSIS — Z85850 Personal history of malignant neoplasm of thyroid: Secondary | ICD-10-CM | POA: Diagnosis not present

## 2022-05-06 DIAGNOSIS — E890 Postprocedural hypothyroidism: Secondary | ICD-10-CM

## 2022-05-06 LAB — TSH: TSH: 0.43 u[IU]/mL (ref 0.35–5.50)

## 2022-05-06 MED ORDER — SYNTHROID 150 MCG PO TABS: 150.0000 ug | ORAL_TABLET | Freq: Every day | ORAL | 3 refills | Status: DC

## 2022-05-06 NOTE — Progress Notes (Signed)
Name: Carla Knight  MRN/ DOB: [id number], 1976-07-08    Age/ Sex: 46 y.o., female    PCP: Carla Perches, MD   Reason for Endocrinology Evaluation: PTC/Hypothyroidism     Date of Initial Endocrinology Evaluation: 10/31/2021    HPI: Carla Knight is a 46 y.o. female with a past medical history of T2DM, Postoperative Hypothyroidism and HTN. The patient presented for initial endocrinology clinic visit on 10/31/2021 for consultative assistance with her PTC/Hypothyroidism.   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  S/P Thyrogen - stimulated RAI  of 54.8 mCi   Post-radioiodine whole body scan on 08/03/12 showed findings consistent with residual functioning thyroid tissue within the thyroid bed, with no evidence for distant metastatic disease.    S/P excision of benign  right parotid mass 12/17/2011   Mother with thyroid disease    SUBJECTIVE:    Today (05/06/22):  Carla Knight is here for a follow up on Hx of PTC and postoperative Hypothyroidism   Denies local neck symptoms  Denies constipation or diarrhea  Denies palpitations  Denies tremors   Synthroid 200 mcg daily except Sundays       HISTORY:  Past Medical History:  Past Medical History:  Diagnosis Date   Diabetes mellitus    NIDDM, type 2    Dyslipidemia    Goiter    no current meds.   Hypertension    under control; has been on med. x 5-6 yrs.   Hypothyroidism    Papillary thyroid carcinoma (HCC)    incidentally found during total thyroidectomy , neegative for metastasis   Parotid mass 11/2011   right   PONV (postoperative nausea and vomiting)    nauseated when ate after surgery   Thyroid mass    Past Surgical History:  Past Surgical History:  Procedure Laterality Date   ABLATION  2016 or 2017 unsure   obgyn dr Ernestina Penna    CESAREAN SECTION  10/18/2008   CESAREAN SECTION W/BTL  04/08/2011   LAPAROSCOPIC CHOLECYSTECTOMY   06/05/2009   PAROTIDECTOMY  12/17/2011   Procedure: PAROTIDECTOMY;  Surgeon: Darletta Moll, MD;  Location: [facility];  Service: ENT;  Laterality: Right;  Total right parotidectomy   ROBOTIC ASSISTED LAPAROSCOPIC HYSTERECTOMY AND SALPINGECTOMY Bilateral 05/01/2018   Procedure: XI ROBOTIC ASSISTED LAPAROSCOPIC HYSTERECTOMY AND SALPINGECTOMY;  Surgeon: Noland Fordyce, MD;  Location: [facility];  Service: Gynecology;  Laterality: Bilateral;  Requests 3 1/2 hrs.   ROBOTIC ASSISTED LAPAROSCOPIC LYSIS OF ADHESION  05/01/2018   Procedure: XI ROBOTIC ASSISTED LAPAROSCOPIC LYSIS OF ADHESION;  Surgeon: Noland Fordyce, MD;  Location: [facility];  Service: Gynecology;;   THYROIDECTOMY  05/13/2012   Procedure: THYROIDECTOMY;  Surgeon: Darletta Moll, MD;  Location: [facility];  Service: ENT;  Laterality: Bilateral;  TOTAL THYROIDECTOMY    Social History:  reports that she has been smoking cigarettes. She has a 15.00 pack-year smoking history. She has never used smokeless tobacco. She reports current alcohol use of about 2.0 standard drinks of alcohol per week. She reports that she does not use drugs. Family History: family history is not on file.   HOME MEDICATIONS: Allergies as of 05/06/2022       Reactions   Adhesive [tape] Hives        Medication List        Accurate as of May 06, 2022  9:53 AM. If you have any questions,  ask your nurse or doctor.          STOP taking these medications    pioglitazone 15 MG tablet Commonly known as: ACTOS Stopped by: Carla Sciara, MD       TAKE these medications    atorvastatin 20 MG tablet Commonly known as: LIPITOR Take 20 mg by mouth every evening.   Childrens Gummies [place] 2 tablets by mouth every evening.   glimepiride 1 MG tablet Commonly known as: AMARYL Take 1 mg by mouth daily with lunch.   hydrochlorothiazide 25 MG tablet Commonly known as: HYDRODIURIL Take 25 mg by mouth every morning.   ibuprofen 200 MG  tablet Commonly known as: ADVIL Take 600-800 mg by mouth 2 (two) times daily as needed for moderate pain or cramping.   Jardiance 25 MG Tabs tablet Generic drug: empagliflozin Take 25 mg by mouth daily.   lisinopril 10 MG tablet Commonly known as: ZESTRIL Take 10 mg by mouth every morning.   metFORMIN 1000 MG tablet Commonly known as: GLUCOPHAGE Take 1,000 mg by mouth 2 (two) times daily.   Synthroid 200 MCG tablet Generic drug: levothyroxine Take 1 tablet (200 mcg total) by mouth daily before breakfast. What changed: additional instructions   Trulicity 1.5 GY/6.9SW Sopn Generic drug: Dulaglutide Inject 1.5 mg into the skin once a week.          REVIEW OF SYSTEMS: A comprehensive ROS was conducted with the patient and is negative except as per HPI     OBJECTIVE:  VS: BP 110/68 (BP Location: Left Arm, Patient Position: Sitting, Cuff Size: Small)   Pulse 89   Ht $R'5\' 3"'LV$  (1.6 m)   Wt 168 lb 9.6 oz (76.5 kg)   SpO2 97%   BMI 29.87 kg/m    Wt Readings from Last 3 Encounters:  05/06/22 168 lb 9.6 oz (76.5 kg)  04/16/22 166 lb (75.3 kg)  10/31/21 166 lb 3.2 oz (75.4 kg)     EXAM: General: Pt appears well and is in NAD  Neck: General: Supple without adenopathy. Thyroid: Thyroid size normal.  No goiter or nodules appreciated. No thyroid bruit.  Lungs: Clear with good BS bilat with no rales, rhonchi, or wheezes  Heart: Auscultation: RRR.  Abdomen: Normoactive bowel sounds, soft, nontender, without masses or organomegaly palpable  Extremities:  BL LE: No pretibial edema normal ROM and strength.  Mental Status: Judgment, insight: Intact Orientation: Oriented to time, place, and person Mood and affect: No depression, anxiety, or agitation     DATA REVIEWED:  Latest Reference Range & Units 05/06/22 10:03  TSH 0.35 - 5.50 uIU/mL 0.43       Thyroid Ultrasound 11/21/2021  Isthmus: Surgically absent. There is no residual nodular soft tissue within the isthmic  resection bed.   Right lobe: Surgically absent. There is no residual nodular soft tissue within the right lobectomy resection bed.   Left lobe: Surgically absent. There is no residual nodular soft tissue within the left lobectomy resection bed.   _________________________________________________________   No regional cervical lymphadenopathy.   IMPRESSION: Post total thyroidectomy without evidence of residual or locally recurrent disease.   ASSESSMENT/PLAN/RECOMMENDATIONS:   Hx of PTC , Stage I :  - She is S/P total thyroidectomy in 04/2012 with a 4 cm PTC , follicular variant. She is S/P remnant ablation with 54.8 mCi of I-131  - Pt with excellent structural and biochemical response at this time  - Tg and Tg Ab have been undetectable in the  past, pending today  - Thyroid bed ultrasound 11/2021 showed no evidence of  - TSH goal 0.5-2.0 uIU/mL  -TSH today is below goal, will reduce Synthroid as below     2. Postoperative Hypothyroidism :  - Pt is clinically euthyroid  - No local neck symptoms  - TSH is trending down and below goal, will reduce Synthroid as below due to risk of increased bone resorption with prolonged TSH suppression -- Pt educated extensively on the correct way to take levothyroxine (first thing in the morning with water, 30 minutes before eating or taking other medications). - Pt encouraged to double dose the following day if she were to miss a dose given long half-life of levothyroxine. - She is on Bran name Synthroid and gets this from [facility]  Medications : Stop Synthroid 200 mcg , 6 days a week  Start Synthroid 150 mcg daily    F/U in 1 yr   Signed electronically by: Mack Guise, MD  [facility] Endocrinology  [facility] [address] [place], [place] [address] Phone: [phone] FAX: [phone]   CC: Asencion Noble, [place] [address] Phone: [phone] Fax:  [phone]   Return to Endocrinology clinic as below: No future appointments.

## 2022-05-06 NOTE — Patient Instructions (Signed)

## 2022-05-07 LAB — THYROGLOBULIN LEVEL: Thyroglobulin: 0.1 ng/mL — ABNORMAL LOW

## 2022-05-07 LAB — THYROGLOBULIN ANTIBODY: Thyroglobulin Ab: <1 IU/mL (ref <=1)

## 2022-06-06 ENCOUNTER — Encounter: Payer: Self-pay | Admitting: Internal Medicine

## 2022-06-06 ENCOUNTER — Ambulatory Visit (INDEPENDENT_AMBULATORY_CARE_PROVIDER_SITE_OTHER): Payer: Commercial Managed Care - PPO | Admitting: Internal Medicine

## 2022-06-06 VITALS — BP 115/71 | HR 81 | Ht 63.0 in | Wt 171.0 lb

## 2022-06-06 DIAGNOSIS — R072 Precordial pain: Secondary | ICD-10-CM | POA: Diagnosis not present

## 2022-06-06 MED ORDER — METOPROLOL TARTRATE 100 MG PO TABS: 100.0000 mg | ORAL_TABLET | Freq: Once | ORAL | 0 refills | Status: DC

## 2022-06-06 NOTE — Progress Notes (Signed)
Cardiology Office Note:    Date:  06/06/2022   ID:  Carla Knight, DOB February 12, 1976, MRN [id number]  PCP:  Asencion Noble, MD   [facility] Prov[facility] Cardiologist:  Janina Mayo, MD     Referring MD: Asencion Noble, MD   No chief complaint on file. Cp  History of Present Illness:    Carla Knight is a 46 y.o. female with a hx of DM2, HTN, papillary thyroid s/p total thryoidectomy. CA referral for ED visit with chest pain. 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 The sensation when down her left arm. She has a lot of job stress and some anxiety related symptoms. She notes her chest continues to feel uncomfortable. Nothing makes it worse. It comes and goes lastiing 10-20 minutes. Symptoms are not just associated with activity. She smoked in the past.Her father had CABG and PCI. Mother has HTN.  In the ED, ACS was ruled out.. EKG showed NSR. No ST changes. She has no cardiac dx hx.   Blood pressure is normal.  Past Medical History:  Diagnosis Date   Diabetes mellitus    NIDDM, type 2    Dyslipidemia    Goiter    no current meds.   Hypertension    under control; has been on med. x 5-6 yrs.   Hypothyroidism    Papillary thyroid carcinoma ([place])    incidentally found during total thyroidectomy , neegative for metastasis   Parotid mass 11/2011   right   PONV (postoperative nausea and vomiting)    nauseated when ate after surgery   Thyroid mass     Past Surgical History:  Procedure Laterality Date   ABLATION  2016 or 2017 unsure   obgyn dr Pamala Hurry    CESAREAN SECTION  10/18/2008   CESAREAN SECTION W/BTL  04/08/2011   LAPAROSCOPIC CHOLECYSTECTOMY  06/05/2009   PAROTIDECTOMY  12/17/2011   Procedure: PAROTIDECTOMY;  Surgeon: Ascencion Dike, MD;  Location: [place];  Service: ENT;  Laterality: Right;  Total right parotidectomy   ROBOTIC ASSISTED LAPAROSCOPIC HYSTERECTOMY AND SALPINGECTOMY Bilateral 05/01/2018   Procedure: XI  ROBOTIC ASSISTED LAPAROSCOPIC HYSTERECTOMY AND SALPINGECTOMY;  Surgeon: Aloha Gell, MD;  [facility];  Service: Gynecology;  Laterality: Bilateral;  Requests 3 1/2 hrs.   ROBOTIC ASSISTED LAPAROSCOPIC LYSIS OF ADHESION  05/01/2018   Procedure: XI ROBOTIC ASSISTED LAPAROSCOPIC LYSIS OF ADHESION;  Surgeon: Aloha Gell, MD;  [facility];  Service: Gynecology;;   THYROIDECTOMY  05/13/2012   Procedure: THYROIDECTOMY;  Surgeon: Ascencion Dike, MD;  Location: [facility] OR;  Service: ENT;  Laterality: Bilateral;  TOTAL THYROIDECTOMY    Current Medications: Current Meds  Medication Sig   atorvastatin (LIPITOR) 20 MG tablet Take 20 mg by mouth every evening.    glimepiride (AMARYL) 1 MG tablet Take 1 mg by mouth daily with lunch.   hydrochlorothiazide (HYDRODIURIL) 25 MG tablet Take 25 mg by mouth every morning.    ibuprofen (ADVIL,MOTRIN) 200 MG tablet Take 600-800 mg by mouth 2 (two) times daily as needed for moderate pain or cramping.    JARDIANCE 25 MG TABS tablet Take 25 mg by mouth daily.   lisinopril (PRINIVIL,ZESTRIL) 10 MG tablet Take 10 mg by mouth every morning.    metFORMIN (GLUCOPHAGE) 1000 MG tablet Take 1,000 mg by mouth 2 (two) times daily.   metoprolol tartrate (LOPRESSOR) 100 MG tablet Take 1 tablet (100 mg total) by mouth once for 1  dose. PLEASE TAKE METOPROLOL 2  HOURS PRIOR TO CTA SCAN.   OZEMPIC, 0.25 OR 0.5 MG/DOSE, 2 MG/3ML SOPN Inject into the skin.   Pediatric Multivit-Minerals-C (CHILDRENS GUMMIES) CHEW Chew 2 tablets by mouth every evening.   SYNTHROID 150 MCG tablet Take 1 tablet (150 mcg total) by mouth daily before breakfast.     Allergies:   Adhesive [tape]   Social History   Socioeconomic History   Marital status: Married    Spouse name: Not on file   Number of children: Not on file   Years of education: Not on file   Highest education level: Not on file  Occupational History   Not on file  Tobacco Use   Smoking status: Some Days    Packs/day: 1.00     Years: 15.00    Total pack years: 15.00    Types: Cigarettes   Smokeless tobacco: Never   Tobacco comments:    "im a casual mosker, every now and then"   Substance and Sexual Activity   Alcohol use: Yes    Alcohol/week: 2.0 standard drinks of alcohol    Types: 2 Glasses of wine per week    Comment: occasionally   Drug use: No   Sexual activity: Not on file  Other Topics Concern   Not on file  Social History Narrative   Not on file   Social Determinants of Health   Financial Resource Strain: Not on file  Food Insecurity: Not on file  Transportation Needs: Not on file  Physical Activity: Not on file  Stress: Not on file  Social Connections: Not on file     Family History: Father- cardiac dx per above Mother - HTN  ROS:   Please see the history of present illness.     All other systems reviewed and are negative.  EKGs/Labs/Other Studies Reviewed:    The following studies were reviewed today:   EKG:  EKG is  ordered today.  The ekg ordered today demonstrates    06/06/2022- NSR  Recent Labs: 04/16/2022: BUN 13; Creatinine, Ser 0.57; Hemoglobin 14.3; Platelets 253; Potassium 4.3; Sodium 135 05/06/2022: TSH 0.43  Recent Lipid Panel No results found for: "CHOL", "TRIG", "HDL", "CHOLHDL", "VLDL", "LDLCALC", "LDLDIRECT"   Risk Assessment/Calculations:           Physical Exam:    VS:  Vitals:   06/06/22 1307  BP: 115/71  Pulse: 81  SpO2: 97%     Wt Readings from Last 3 Encounters:  06/06/22 171 lb (77.6 kg)  05/06/22 168 lb 9.6 oz (76.5 kg)  04/16/22 166 lb (75.3 kg)     GEN:  Well nourished, well developed in no acute distress HEENT: Normal NECK: No JVD; No carotid bruits LYMPHATICS: No lymphadenopathy CARDIAC: RRR, no murmurs, rubs, gallops RESPIRATORY:  Clear to auscultation without rales, wheezing or rhonchi  ABDOMEN: Soft, non-tender, non-distended MUSCULOSKELETAL:  No edema; No deformity  SKIN: Warm and dry NEUROLOGIC:  Alert and oriented x  3 PSYCHIATRIC:  Normal affect   ASSESSMENT:    Chest Pain: Her symptoms are not related to activity however she has DM2/female. With these CVD risk factors and smoking, will plan for anatomical ischemic evaluation with coronary CTA PLAN:    In order of problems listed above:  Coronary CTA morph Follow up PRN pending study      Medication Adjustments/Labs and Tests Ordered: Current medicines are reviewed at length with the patient today.  Concerns regarding medicines are outlined above.  Orders Placed  This Encounter  Procedures   CT CORONARY MORPH W/CTA COR W/SCORE W/CA W/CM &/OR WO/CM   Basic metabolic panel   EKG 67-RFFM   Meds ordered this encounter  Medications   metoprolol tartrate (LOPRESSOR) 100 MG tablet    Sig: Take 1 tablet (100 mg total) by mouth once for 1 dose. PLEASE TAKE METOPROLOL 2  HOURS PRIOR TO CTA SCAN.    Dispense:  1 tablet    Refill:  0    Patient Instructions  Medication Instructions:  PLEASE TAKE METOPROLOL TARTRATE '100mg'$  TWO HOURS PRIOR TO CCTA SCAN  *If you need a refill on your cardiac medications before your next appointment, please call your pharmacy*  Lab Work:  BLOOD WORK TODAY  If you have labs (blood work) drawn today and your tests are completely normal, you will receive your results only by: [place] (if you have MyChart) OR A paper copy in the mail If you have any lab test that is abnormal or we need to change your treatment, we will call you to review the results.  Testing/Procedures: Your physician has requested that you have cardiac CT. Cardiac computed tomography (CT) is a painless test that uses an x-ray machine to take clear, detailed pictures of your heart. For further information please visit HugeFiesta.tn. Please follow instruction sheet as given.  Follow-Up: At [facility], you and your health needs are our priority.  As part of our continuing mission to provide you with exceptional heart care, we have  created designated Provider Care Teams.  These Care Teams include your primary Cardiologist (physician) and Advanced Practice Providers (APPs -  Physician Assistants and Nurse Practitioners) who all work together to provide you with the care you need, when you need it.  Your next appointment:   AS NEEDED   The format for your next appointment:   In Person  Provider:   Janina Mayo, MD    Other Instructions   Your cardiac CT will be scheduled at one of the below locations:   [facility] [address] [place], [place] [address] [phone]  If scheduled at [facility], please arrive at the [facility] and Children's Entrance (Entrance C2) of [facility] 30 minutes prior to test start time. You can use the FREE valet parking offered at entrance C (encouraged to control the heart rate for the test)  Proceed to the [facility] Radiology Department (first floor) to check-in and test prep.  All radiology patients and guests should use entrance C2 at [facility], accessed from [facility], even though the hospital's physical address listed is [address].    Please follow these instructions carefully (unless otherwise directed):  On the Night Before the Test: Be sure to Drink plenty of water. Do not consume any caffeinated/decaffeinated beverages or chocolate 12 hours prior to your test. Do not take any antihistamines 12 hours prior to your test. If the patient has contrast allergy:  On the Day of the Test: Drink plenty of water until 1 hour prior to the test. Do not eat any food 4 hours prior to the test. You may take your regular medications prior to the test.  Take metoprolol (Lopressor) two hours prior to test. HOLD Furosemide/Hydrochlorothiazide morning of the test. FEMALES- please wear underwire-free bra if available, avoid dresses & tight clothing      After the Test: Drink plenty of water. After receiving IV  contrast, you may experience a mild flushed  feeling. This is normal. On occasion, you may experience a mild rash up to 24 hours after the test. This is not dangerous. If this occurs, you can take Benadryl 25 mg and increase your fluid intake. If you experience trouble breathing, this can be serious. If it is severe call 911 IMMEDIATELY. If it is mild, please call our office. If you take any of these medications: Glipizide/Metformin, Avandament, Glucavance, please do not take 48 hours after completing test unless otherwise instructed.  We will call to schedule your test 2-4 weeks out understanding that some insurance companies will need an authorization prior to the service being performed.   For non-scheduling related questions, please contact the cardiac imaging nurse navigator should you have any questions/concerns: Marchia Bond, Cardiac Imaging Nurse Navigator Gordy Clement, Cardiac Imaging Nurse Navigator [facility] Direct Office Dial: [phone]   For scheduling needs, including cancellations and rescheduling, please call [place], [phone].           Signed, Janina Mayo, MD  06/06/2022 1:27 PM    [place]

## 2022-06-06 NOTE — Patient Instructions (Addendum)
Medication Instructions:  PLEASE TAKE METOPROLOL TARTRATE '100mg'$  TWO HOURS PRIOR TO CCTA SCAN  *If you need a refill on your cardiac medications before your next appointment, please call your pharmacy*  Lab Work:  BLOOD WORK TODAY  If you have labs (blood work) drawn today and your tests are completely normal, you will receive your results only by: [place] (if you have MyChart) OR A paper copy in the mail If you have any lab test that is abnormal or we need to change your treatment, we will call you to review the results.  Testing/Procedures: Your physician has requested that you have cardiac CT. Cardiac computed tomography (CT) is a painless test that uses an x-ray machine to take clear, detailed pictures of your heart. For further information please visit HugeFiesta.tn. Please follow instruction sheet as given.  Follow-Up: At [facility], you and your health needs are our priority.  As part of our continuing mission to provide you with exceptional heart care, we have created designated Provider Care Teams.  These Care Teams include your primary Cardiologist (physician) and Advanced Practice Providers (APPs -  Physician Assistants and Nurse Practitioners) who all work together to provide you with the care you need, when you need it.  Your next appointment:   AS NEEDED   The format for your next appointment:   In Person  Provider:   Janina Mayo, MD    Other Instructions   Your cardiac CT will be scheduled at one of the below locations:   [facility] [address] [place], [place] [address] [phone]  If scheduled at [facility], please arrive at the [facility] and Children's Entrance (Entrance C2) of [facility] 30 minutes prior to test start time. You can use the FREE valet parking offered at entrance C (encouraged to control the heart rate for the test)  Proceed to the [facility] Radiology Department (first floor) to check-in  and test prep.  All radiology patients and guests should use entrance C2 at [facility], accessed from [facility], even though the hospital's physical address listed is [address]    Please follow these instructions carefully (unless otherwise directed):  On the Night Before the Test: Be sure to Drink plenty of water. Do not consume any caffeinated/decaffeinated beverages or chocolate 12 hours prior to your test. Do not take any antihistamines 12 hours prior to your test. If the patient has contrast allergy:  On the Day of the Test: Drink plenty of water until 1 hour prior to the test. Do not eat any food 4 hours prior to the test. You may take your regular medications prior to the test.  Take metoprolol (Lopressor) two hours prior to test. HOLD Furosemide/Hydrochlorothiazide morning of the test. FEMALES- please wear underwire-free bra if available, avoid dresses & tight clothing      After the Test: Drink plenty of water. After receiving IV contrast, you may experience a mild flushed feeling. This is normal. On occasion, you may experience a mild rash up to 24 hours after the test. This is not dangerous. If this occurs, you can take Benadryl 25 mg and increase your fluid intake. If you experience trouble breathing, this can be serious. If it is severe call 911 IMMEDIATELY. If it is mild, please call our office. If you take any of these medications: Glipizide/Metformin, Avandament, Glucavance, please do not take 48 hours after completing test unless otherwise instructed.  We will call  to schedule your test 2-4 weeks out understanding that some insurance companies will need an authorization prior to the service being performed.   For non-scheduling related questions, please contact the cardiac imaging nurse navigator should you have any questions/concerns: Marchia Bond, Cardiac Imaging Nurse Navigator Gordy Clement, Cardiac Imaging Nurse  Navigator [facility] Direct Office Dial: [phone]   For scheduling needs, including cancellations and rescheduling, please call [place], [phone].

## 2022-06-06 NOTE — H&P (View-Only) (Signed)
Cardiology Office Note:    Date:  06/06/2022   ID:  Carla Knight, DOB 01-23-1976, MRN [id number]  PCP:  Asencion Noble, MD   [place] [facility] Cardiologist:  Janina Mayo, MD     Referring MD: Asencion Noble, MD   No chief complaint on file. Cp  History of Present Illness:    Carla Knight is a 46 y.o. female with a hx of DM2, HTN, papillary thyroid s/p total thryoidectomy. CA referral for ED visit with chest pain. 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 The sensation when down her left arm. She has a lot of job stress and some anxiety related symptoms. She notes her chest continues to feel uncomfortable. Nothing makes it worse. It comes and goes lastiing 10-20 minutes. Symptoms are not just associated with activity. She smoked in the past.Her father had CABG and PCI. Mother has HTN.  In the ED, ACS was ruled out.. EKG showed NSR. No ST changes. She has no cardiac dx hx.   Blood pressure is normal.  Past Medical History:  Diagnosis Date   Diabetes mellitus    NIDDM, type 2    Dyslipidemia    Goiter    no current meds.   Hypertension    under control; has been on med. x 5-6 yrs.   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   right   PONV (postoperative nausea and vomiting)    nauseated when ate after surgery   Thyroid mass     Past Surgical History:  Procedure Laterality Date   ABLATION  2016 or 2017 unsure   obgyn dr Pamala Hurry    CESAREAN SECTION  10/18/2008   CESAREAN SECTION W/BTL  04/08/2011   LAPAROSCOPIC CHOLECYSTECTOMY  06/05/2009   PAROTIDECTOMY  12/17/2011   Procedure: PAROTIDECTOMY;  Surgeon: Ascencion Dike, MD;  Location: [place];  Service: ENT;  Laterality: Right;  Total right parotidectomy   ROBOTIC ASSISTED LAPAROSCOPIC HYSTERECTOMY AND SALPINGECTOMY Bilateral 05/01/2018   Procedure: XI  ROBOTIC ASSISTED LAPAROSCOPIC HYSTERECTOMY AND SALPINGECTOMY;  Surgeon: Aloha Gell, MD;  Location: [facility];  Service: Gynecology;  Laterality: Bilateral;  Requests 3 1/2 hrs.   ROBOTIC ASSISTED LAPAROSCOPIC LYSIS OF ADHESION  05/01/2018   Procedure: XI ROBOTIC ASSISTED LAPAROSCOPIC LYSIS OF ADHESION;  Surgeon: Aloha Gell, MD;  Location: [facility];  Service: Gynecology;;   THYROIDECTOMY  05/13/2012   Procedure: THYROIDECTOMY;  Surgeon: Ascencion Dike, MD;  Location: [facility] OR;  Service: ENT;  Laterality: Bilateral;  TOTAL THYROIDECTOMY    Current Medications: Current Meds  Medication Sig   atorvastatin (LIPITOR) 20 MG tablet Take 20 mg by mouth every evening.    glimepiride (AMARYL) 1 MG tablet Take 1 mg by mouth daily with lunch.   hydrochlorothiazide (HYDRODIURIL) 25 MG tablet Take 25 mg by mouth every morning.    ibuprofen (ADVIL,MOTRIN) 200 MG tablet Take 600-800 mg by mouth 2 (two) times daily as needed for moderate pain or cramping.    JARDIANCE 25 MG TABS tablet Take 25 mg by mouth daily.   lisinopril (PRINIVIL,ZESTRIL) 10 MG tablet Take 10 mg by mouth every morning.    metFORMIN (GLUCOPHAGE) 1000 MG tablet Take 1,000 mg by mouth 2 (two) times daily.   metoprolol tartrate (LOPRESSOR) 100 MG tablet Take 1 tablet (100 mg total) by mouth once for 1  dose. PLEASE TAKE METOPROLOL 2  HOURS PRIOR TO CTA SCAN.   OZEMPIC, 0.25 OR 0.5 MG/DOSE, 2 MG/3ML SOPN Inject into the skin.   Pediatric Multivit-Minerals-C (CHILDRENS GUMMIES) CHEW Chew 2 tablets by mouth every evening.   SYNTHROID 150 MCG tablet Take 1 tablet (150 mcg total) by mouth daily before breakfast.     Allergies:   Adhesive [tape]   Social History   Socioeconomic History   Marital status: Married    Spouse name: Not on file   Number of children: Not on file   Years of education: Not on file   Highest education level: Not on file  Occupational History   Not on file  Tobacco Use   Smoking status: Some Days    Packs/day: 1.00     Years: 15.00    Total pack years: 15.00    Types: Cigarettes   Smokeless tobacco: Never   Tobacco comments:    "im a casual mosker, every now and then"   Substance and Sexual Activity   Alcohol use: Yes    Alcohol/week: 2.0 standard drinks of alcohol    Types: 2 Glasses of wine per week    Comment: occasionally   Drug use: No   Sexual activity: Not on file  Other Topics Concern   Not on file  Social History Narrative   Not on file   Social Determinants of Health   Financial Resource Strain: Not on file  Food Insecurity: Not on file  Transportation Needs: Not on file  Physical Activity: Not on file  Stress: Not on file  Social Connections: Not on file     Family History: Father- cardiac dx per above Mother - HTN  ROS:   Please see the history of present illness.     All other systems reviewed and are negative.  EKGs/Labs/Other Studies Reviewed:    The following studies were reviewed today:   EKG:  EKG is  ordered today.  The ekg ordered today demonstrates    06/06/2022- NSR  Recent Labs: 04/16/2022: BUN 13; Creatinine, Ser 0.57; Hemoglobin 14.3; Platelets 253; Potassium 4.3; Sodium 135 05/06/2022: TSH 0.43  Recent Lipid Panel No results found for: "CHOL", "TRIG", "HDL", "CHOLHDL", "VLDL", "LDLCALC", "LDLDIRECT"   Risk Assessment/Calculations:           Physical Exam:    VS:  Vitals:   06/06/22 1307  BP: 115/71  Pulse: 81  SpO2: 97%     Wt Readings from Last 3 Encounters:  06/06/22 171 lb (77.6 kg)  05/06/22 168 lb 9.6 oz (76.5 kg)  04/16/22 166 lb (75.3 kg)     GEN:  Well nourished, well developed in no acute distress HEENT: Normal NECK: No JVD; No carotid bruits LYMPHATICS: No lymphadenopathy CARDIAC: RRR, no murmurs, rubs, gallops RESPIRATORY:  Clear to auscultation without rales, wheezing or rhonchi  ABDOMEN: Soft, non-tender, non-distended MUSCULOSKELETAL:  No edema; No deformity  SKIN: Warm and dry NEUROLOGIC:  Alert and oriented x  3 PSYCHIATRIC:  Normal affect   ASSESSMENT:    Chest Pain: Her symptoms are not related to activity however she has DM2/female. With these CVD risk factors and smoking, will plan for anatomical ischemic evaluation with coronary CTA PLAN:    In order of problems listed above:  Coronary CTA morph Follow up PRN pending study      Medication Adjustments/Labs and Tests Ordered: Current medicines are reviewed at length with the patient today.  Concerns regarding medicines are outlined above.  Orders Placed  This Encounter  Procedures   CT CORONARY MORPH W/CTA COR W/SCORE W/CA W/CM &/OR WO/CM   Basic metabolic panel   EKG [id number]   Meds ordered this encounter  Medications   metoprolol tartrate (LOPRESSOR) 100 MG tablet    Sig: Take 1 tablet (100 mg total) by mouth once for 1 dose. PLEASE TAKE METOPROLOL 2  HOURS PRIOR TO CTA SCAN.    Dispense:  1 tablet    Refill:  0    Patient Instructions  Medication Instructions:  PLEASE TAKE METOPROLOL TARTRATE '100mg'$  TWO HOURS PRIOR TO CCTA SCAN  *If you need a refill on your cardiac medications before your next appointment, please call your pharmacy*  Lab Work:  BLOOD WORK TODAY  If you have labs (blood work) drawn today and your tests are completely normal, you will receive your results only by: [place] (if you have MyChart) OR A paper copy in the mail If you have any lab test that is abnormal or we need to change your treatment, we will call you to review the results.  Testing/Procedures: Your physician has requested that you have cardiac CT. Cardiac computed tomography (CT) is a painless test that uses an x-ray machine to take clear, detailed pictures of your heart. For further information please visit [email or web address]. Please follow instruction sheet as given.  Follow-Up: At [facility], you and your health needs are our priority.  As part of our continuing mission to provide you with exceptional heart care, we have  created designated Provider Care Teams.  These Care Teams include your primary Cardiologist (physician) and Advanced Practice Providers (APPs -  Physician Assistants and Nurse Practitioners) who all work together to provide you with the care you need, when you need it.  Your next appointment:   AS NEEDED   The format for your next appointment:   In Person  Provider:   Janina Mayo, MD    Other Instructions   Your cardiac CT will be scheduled at one of the below locations:   [facility] [address] [place], [place] [address] [phone]  If scheduled at [facility], please arrive at the [facility] and Children's Entrance (Entrance C2) of [facility] 30 minutes prior to test start time. You can use the FREE valet parking offered at entrance C (encouraged to control the heart rate for the test)  Proceed to the [facility] Radiology Department (first floor) to check-in and test prep.  All radiology patients and guests should use entrance C2 at [facility], accessed from [facility], even though the hospital's physical address listed is [address]    Please follow these instructions carefully (unless otherwise directed):  On the Night Before the Test: Be sure to Drink plenty of water. Do not consume any caffeinated/decaffeinated beverages or chocolate 12 hours prior to your test. Do not take any antihistamines 12 hours prior to your test. If the patient has contrast allergy:  On the Day of the Test: Drink plenty of water until 1 hour prior to the test. Do not eat any food 4 hours prior to the test. You may take your regular medications prior to the test.  Take metoprolol (Lopressor) two hours prior to test. HOLD Furosemide/Hydrochlorothiazide morning of the test. FEMALES- please wear underwire-free bra if available, avoid dresses & tight clothing      After the Test: Drink plenty of water. After receiving IV  contrast, you may experience a mild flushed  feeling. This is normal. On occasion, you may experience a mild rash up to 24 hours after the test. This is not dangerous. If this occurs, you can take Benadryl 25 mg and increase your fluid intake. If you experience trouble breathing, this can be serious. If it is severe call 911 IMMEDIATELY. If it is mild, please call our office. If you take any of these medications: Glipizide/Metformin, Avandament, Glucavance, please do not take 48 hours after completing test unless otherwise instructed.  We will call to schedule your test 2-4 weeks out understanding that some insurance companies will need an authorization prior to the service being performed.   For non-scheduling related questions, please contact the cardiac imaging nurse navigator should you have any questions/concerns: Marchia Bond, Cardiac Imaging Nurse Navigator Gordy Clement, Cardiac Imaging Nurse Navigator [facility] Direct Office Dial: [phone]   For scheduling needs, including cancellations and rescheduling, please call [place], [phone].           Signed, Janina Mayo, MD  06/06/2022 1:27 PM    [place]

## 2022-06-07 LAB — BASIC METABOLIC PANEL
BUN/Creatinine Ratio: 15 (ref 9–23)
BUN: 9 mg/dL (ref 6–24)
CO2: 23 mmol/L (ref 20–29)
Calcium: 10 mg/dL (ref 8.7–10.2)
Chloride: 96 mmol/L (ref 96–106)
Creatinine, Ser: 0.62 mg/dL (ref 0.57–1.00)
Glucose: 140 mg/dL — ABNORMAL HIGH (ref 70–99)
Potassium: 3.8 mmol/L (ref 3.5–5.2)
Sodium: 138 mmol/L (ref 134–144)
eGFR: 111 mL/min/{1.73_m2} (ref >59)

## 2022-06-24 ENCOUNTER — Telehealth (HOSPITAL_COMMUNITY): Payer: Self-pay | Admitting: Emergency Medicine

## 2022-06-24 NOTE — Telephone Encounter (Signed)
Reaching out to patient to offer assistance regarding upcoming cardiac imaging study; pt verbalizes understanding of appt date/time, parking situation and where to check in, pre-test NPO status and medications ordered, and verified current allergies; name and call back number provided for further questions should they arise Marchia Bond RN Navigator Cardiac Imaging [place] [facility] [phone] office [phone] cell  Aware nitro Denies iv issues Arrival 1200 WC entrance '100mg'$  metoprolol tart Holding diabetes meds, BP meds

## 2022-06-25 ENCOUNTER — Telehealth: Payer: Self-pay | Admitting: Internal Medicine

## 2022-06-25 ENCOUNTER — Ambulatory Visit (HOSPITAL_BASED_OUTPATIENT_CLINIC_OR_DEPARTMENT_OTHER)
Admission: RE | Admit: 2022-06-25 | Discharge: 2022-06-25 | Disposition: A | Discharge status: Home / Self Care | Payer: Commercial Managed Care - PPO | Source: Ambulatory Visit | Attending: Cardiology | Admitting: Cardiology

## 2022-06-25 ENCOUNTER — Ambulatory Visit (HOSPITAL_COMMUNITY)
Admission: RE | Admit: 2022-06-25 | Discharge: 2022-06-25 | Disposition: A | Discharge status: Home / Self Care | Payer: Commercial Managed Care - PPO | Source: Ambulatory Visit | Attending: Internal Medicine | Admitting: Internal Medicine

## 2022-06-25 ENCOUNTER — Other Ambulatory Visit: Payer: Self-pay | Admitting: Cardiology

## 2022-06-25 ENCOUNTER — Ambulatory Visit (HOSPITAL_COMMUNITY)
Admission: RE | Admit: 2022-06-25 | Discharge: 2022-06-25 | Disposition: A | Discharge status: Home / Self Care | Payer: Commercial Managed Care - PPO | Source: Ambulatory Visit | Attending: Cardiology | Admitting: Cardiology

## 2022-06-25 DIAGNOSIS — R072 Precordial pain: Secondary | ICD-10-CM | POA: Insufficient documentation

## 2022-06-25 DIAGNOSIS — R931 Abnormal findings on diagnostic imaging of heart and coronary circulation: Secondary | ICD-10-CM | POA: Insufficient documentation

## 2022-06-25 DIAGNOSIS — I2510 Atherosclerotic heart disease of native coronary artery without angina pectoris: Secondary | ICD-10-CM | POA: Diagnosis not present

## 2022-06-25 MED ORDER — IOHEXOL 350 MG/ML SOLN
95.0000 mL | Freq: Once | INTRAVENOUS | Status: AC | PRN
  Administered 2022-06-25: 95 mL via INTRAVENOUS

## 2022-06-25 MED ORDER — NITROGLYCERIN 0.4 MG SL SUBL
0.8000 mg | SUBLINGUAL_TABLET | Freq: Once | SUBLINGUAL | Status: AC
  Administered 2022-06-25: 0.8 mg via SUBLINGUAL

## 2022-06-25 MED ORDER — SODIUM CHLORIDE 0.9 % IV BOLUS
500.0000 mL | Freq: Once | INTRAVENOUS | Status: AC
  Administered 2022-06-25: 500 mL via INTRAVENOUS

## 2022-06-25 MED ORDER — DILTIAZEM HCL 25 MG/5ML IV SOLN
10.0000 mg | INTRAVENOUS | Status: DC | PRN
  Administered 2022-06-25: 5 mg via INTRAVENOUS

## 2022-06-25 MED ORDER — METOPROLOL TARTRATE 5 MG/5ML IV SOLN: 10.0000 mg | INTRAVENOUS | Status: DC | PRN

## 2022-06-25 MED ORDER — DILTIAZEM HCL 25 MG/5ML IV SOLN
INTRAVENOUS | Status: AC
  Administered 2022-06-25: 5 mg via INTRAVENOUS
  Filled 2022-06-25: qty 5

## 2022-06-25 MED ORDER — METOPROLOL TARTRATE 5 MG/5ML IV SOLN
INTRAVENOUS | Status: AC
  Administered 2022-06-25: 10 mg via INTRAVENOUS
  Filled 2022-06-25: qty 20

## 2022-06-25 MED ORDER — NITROGLYCERIN 0.4 MG SL SUBL
SUBLINGUAL_TABLET | SUBLINGUAL | Status: AC
  Filled 2022-06-25: qty 2

## 2022-06-25 NOTE — Telephone Encounter (Signed)
Discussed with Mrs. Carla Knight about the finding of her CT and recommendation for left heart catheterization.  Shared Decision Making/Informed Consent The risks [stroke (1 in 1000), death (1 in 1000), kidney failure [usually temporary] (1 in 500), bleeding (1 in 200), allergic reaction [possibly serious] (1 in 200)], benefits (diagnostic support and management of coronary artery disease) and alternatives of a cardiac catheterization were discussed in detail with Ms. Carla Knight and she is willing to proceed.

## 2022-06-26 ENCOUNTER — Telehealth: Payer: Self-pay

## 2022-06-26 DIAGNOSIS — R931 Abnormal findings on diagnostic imaging of heart and coronary circulation: Secondary | ICD-10-CM

## 2022-06-26 DIAGNOSIS — R072 Precordial pain: Secondary | ICD-10-CM

## 2022-06-26 MED ORDER — ASPIRIN 81 MG PO TBEC: 81.0000 mg | DELAYED_RELEASE_TABLET | Freq: Every day | ORAL | 3 refills | Status: DC

## 2022-06-26 NOTE — Telephone Encounter (Signed)
-----   Message from Janina Mayo, MD sent at 06/25/2022  4:57 PM EDT ----- Jacqualine Mau, please schedule Mrs. Krenz for a LHC. Preferably before Friday if feasible. She started taking aspirin, please include on her med list. She is on ozempic, she'll need to hold her dose the day before the procedure

## 2022-06-26 NOTE — Telephone Encounter (Signed)
Spoke with patient regarding the following results. Patient made aware and patient verbalized understanding.   Patient scheduled for LHC on Tuesday 07/02/22 with Dr. Tamala Julian.  Lab orders placed. Aspirin added to medication list.  Patient aware of all instructions and Cath instructions sent to MyChart for patient to review.

## 2022-06-27 ENCOUNTER — Other Ambulatory Visit: Payer: Self-pay

## 2022-06-27 DIAGNOSIS — R931 Abnormal findings on diagnostic imaging of heart and coronary circulation: Secondary | ICD-10-CM

## 2022-06-28 LAB — BASIC METABOLIC PANEL
BUN/Creatinine Ratio: 16 (ref 9–23)
BUN: 10 mg/dL (ref 6–24)
CO2: 20 mmol/L (ref 20–29)
Calcium: 8.9 mg/dL (ref 8.7–10.2)
Chloride: 103 mmol/L (ref 96–106)
Creatinine, Ser: 0.62 mg/dL (ref 0.57–1.00)
Glucose: 145 mg/dL — ABNORMAL HIGH (ref 70–99)
Potassium: 4.5 mmol/L (ref 3.5–5.2)
Sodium: 140 mmol/L (ref 134–144)
eGFR: 111 mL/min/{1.73_m2} (ref >59)

## 2022-06-28 LAB — CBC
Hematocrit: 43.7 % (ref 34.0–46.6)
Hemoglobin: 14.4 g/dL (ref 11.1–15.9)
MCH: 28.1 pg (ref 26.6–33.0)
MCHC: 33 g/dL (ref 31.5–35.7)
MCV: 85 fL (ref 79–97)
Platelets: 350 10*3/uL (ref 150–450)
RBC: 5.13 x10E6/uL (ref 3.77–5.28)
RDW: 14.2 % (ref 11.7–15.4)
WBC: 11.6 10*3/uL — ABNORMAL HIGH (ref 3.4–10.8)

## 2022-07-01 ENCOUNTER — Telehealth: Payer: Self-pay | Admitting: *Deleted

## 2022-07-01 NOTE — Telephone Encounter (Signed)
Cardiac Catheterization scheduled at [facility] for: Tuesday July 02, 2022 9 AM Arrival time and place: [facility] Main Entrance A at: 7 AM   Nothing to eat after midnight prior to procedure, clear liquids until 5 AM day of procedure.  Medication instructions: -Hold:  Metformin-day of procedure and 48 hours post procedure  Jardiance-AM of procedure  Actos-AM of procedure  Glimepiride-AM of procedure  -Except hold medications usual morning medications can be taken with sips of water including aspirin 81 mg.   Patient reports Ozempic weekly on Saturdays.  Confirmed patient has responsible adult to drive home post procedure and be with patient first 24 hours after arriving home.  Patient reports no new symptoms concerning for COVID-19 in the past 10 days.  Reviewed procedure instructions with patient.

## 2022-07-02 ENCOUNTER — Encounter (HOSPITAL_COMMUNITY): Payer: Self-pay | Admitting: Interventional Cardiology

## 2022-07-02 ENCOUNTER — Ambulatory Visit (HOSPITAL_COMMUNITY)
Admission: RE | Admit: 2022-07-02 | Discharge: 2022-07-03 | Disposition: A | Discharge status: Home / Self Care | Payer: Commercial Managed Care - PPO | Attending: Interventional Cardiology | Admitting: Interventional Cardiology

## 2022-07-02 ENCOUNTER — Telehealth (HOSPITAL_COMMUNITY): Payer: Self-pay | Admitting: Pharmacy Technician

## 2022-07-02 ENCOUNTER — Other Ambulatory Visit (HOSPITAL_COMMUNITY): Payer: Self-pay

## 2022-07-02 ENCOUNTER — Ambulatory Visit (HOSPITAL_COMMUNITY)
Admission: RE | Disposition: A | Discharge status: Home / Self Care | Payer: Self-pay | Source: Home / Self Care | Attending: Interventional Cardiology

## 2022-07-02 DIAGNOSIS — Z8249 Family history of ischemic heart disease and other diseases of the circulatory system: Secondary | ICD-10-CM | POA: Diagnosis not present

## 2022-07-02 DIAGNOSIS — I25119 Atherosclerotic heart disease of native coronary artery with unspecified angina pectoris: Secondary | ICD-10-CM | POA: Diagnosis not present

## 2022-07-02 DIAGNOSIS — I209 Angina pectoris, unspecified: Secondary | ICD-10-CM | POA: Diagnosis present

## 2022-07-02 DIAGNOSIS — I10 Essential (primary) hypertension: Secondary | ICD-10-CM | POA: Insufficient documentation

## 2022-07-02 DIAGNOSIS — E119 Type 2 diabetes mellitus without complications: Secondary | ICD-10-CM | POA: Diagnosis not present

## 2022-07-02 DIAGNOSIS — E785 Hyperlipidemia, unspecified: Secondary | ICD-10-CM | POA: Insufficient documentation

## 2022-07-02 DIAGNOSIS — R931 Abnormal findings on diagnostic imaging of heart and coronary circulation: Secondary | ICD-10-CM | POA: Diagnosis not present

## 2022-07-02 DIAGNOSIS — F17210 Nicotine dependence, cigarettes, uncomplicated: Secondary | ICD-10-CM | POA: Insufficient documentation

## 2022-07-02 DIAGNOSIS — Z7984 Long term (current) use of oral hypoglycemic drugs: Secondary | ICD-10-CM | POA: Diagnosis not present

## 2022-07-02 DIAGNOSIS — E890 Postprocedural hypothyroidism: Secondary | ICD-10-CM | POA: Insufficient documentation

## 2022-07-02 DIAGNOSIS — Z955 Presence of coronary angioplasty implant and graft: Secondary | ICD-10-CM

## 2022-07-02 HISTORY — PX: CORONARY STENT INTERVENTION: CATH118234

## 2022-07-02 HISTORY — PX: LEFT HEART CATH AND CORONARY ANGIOGRAPHY: CATH118249

## 2022-07-02 LAB — POCT ACTIVATED CLOTTING TIME
Activated Clotting Time: 293 seconds
Activated Clotting Time: 360 seconds
Activated Clotting Time: 221 seconds

## 2022-07-02 LAB — GLUCOSE, CAPILLARY
Glucose-Capillary: 192 mg/dL — ABNORMAL HIGH (ref 70–99)
Glucose-Capillary: 149 mg/dL — ABNORMAL HIGH (ref 70–99)

## 2022-07-02 SURGERY — LEFT HEART CATH AND CORONARY ANGIOGRAPHY
Anesthesia: LOCAL

## 2022-07-02 MED ORDER — FENTANYL CITRATE (PF) 100 MCG/2ML IJ SOLN
INTRAMUSCULAR | Status: DC | PRN
Start: 2022-07-02 — End: 2022-07-02
  Administered 2022-07-02 (×2): 25 ug via INTRAVENOUS
  Administered 2022-07-02: 50 ug via INTRAVENOUS

## 2022-07-02 MED ORDER — MIDAZOLAM HCL 2 MG/2ML IJ SOLN
INTRAMUSCULAR | Status: AC
  Filled 2022-07-02: qty 2

## 2022-07-02 MED ORDER — LEVOTHYROXINE SODIUM 150 MCG PO TABS
150.0000 ug | ORAL_TABLET | Freq: Every day | ORAL | Status: DC
Start: 2022-07-03 — End: 2022-07-03
  Administered 2022-07-03: 150 ug via ORAL
  Filled 2022-07-02: qty 1

## 2022-07-02 MED ORDER — FENTANYL CITRATE (PF) 100 MCG/2ML IJ SOLN
INTRAMUSCULAR | Status: AC
  Filled 2022-07-02: qty 2

## 2022-07-02 MED ORDER — SODIUM CHLORIDE 0.9 % WEIGHT BASED INFUSION
3.0000 mL/kg/h | INTRAVENOUS | Status: DC
  Administered 2022-07-02: 3 mL/kg/h via INTRAVENOUS

## 2022-07-02 MED ORDER — NITROGLYCERIN 1 MG/10 ML FOR IR/CATH LAB
INTRA_ARTERIAL | Status: DC | PRN
  Administered 2022-07-02: 200 ug via INTRACORONARY

## 2022-07-02 MED ORDER — NITROGLYCERIN 1 MG/10 ML FOR IR/CATH LAB
INTRA_ARTERIAL | Status: AC
  Filled 2022-07-02: qty 10

## 2022-07-02 MED ORDER — VERAPAMIL HCL 2.5 MG/ML IV SOLN
INTRAVENOUS | Status: AC
  Filled 2022-07-02: qty 2

## 2022-07-02 MED ORDER — LISINOPRIL 10 MG PO TABS
10.0000 mg | ORAL_TABLET | Freq: Every morning | ORAL | Status: DC
Start: 2022-07-03 — End: 2022-07-03
  Administered 2022-07-03: 10 mg via ORAL
  Filled 2022-07-02: qty 1

## 2022-07-02 MED ORDER — ATORVASTATIN CALCIUM 80 MG PO TABS
80.0000 mg | ORAL_TABLET | Freq: Every evening | ORAL | Status: DC
  Administered 2022-07-02: 80 mg via ORAL
  Filled 2022-07-02: qty 1

## 2022-07-02 MED ORDER — LIDOCAINE HCL (PF) 1 % IJ SOLN
INTRAMUSCULAR | Status: AC
  Filled 2022-07-02: qty 30

## 2022-07-02 MED ORDER — METFORMIN HCL 500 MG PO TABS: 1000.0000 mg | ORAL_TABLET | Freq: Two times a day (BID) | ORAL | Status: DC

## 2022-07-02 MED ORDER — HYDRALAZINE HCL 20 MG/ML IJ SOLN: 10.0000 mg | INTRAMUSCULAR | Status: AC | PRN

## 2022-07-02 MED ORDER — DIPHENHYDRAMINE-ZINC ACETATE 2-0.1 % EX CREA
1.0000 | TOPICAL_CREAM | CUTANEOUS | Status: DC | PRN
Start: 2022-07-02 — End: 2022-07-03

## 2022-07-02 MED ORDER — HEPARIN SODIUM (PORCINE) 1000 UNIT/ML IJ SOLN
INTRAMUSCULAR | Status: DC | PRN
  Administered 2022-07-02: 1500 [IU] via INTRAVENOUS
  Administered 2022-07-02: 2000 [IU] via INTRAVENOUS
  Administered 2022-07-02: 4000 [IU] via INTRAVENOUS
  Administered 2022-07-02: 5000 [IU] via INTRAVENOUS

## 2022-07-02 MED ORDER — SODIUM CHLORIDE 0.9 % WEIGHT BASED INFUSION: 3.0000 mL/kg/h | INTRAVENOUS | Status: DC

## 2022-07-02 MED ORDER — SODIUM CHLORIDE 0.9 % WEIGHT BASED INFUSION: 1.0000 mL/kg/h | INTRAVENOUS | Status: DC

## 2022-07-02 MED ORDER — ASPIRIN 81 MG PO CHEW: 81.0000 mg | CHEWABLE_TABLET | ORAL | Status: DC

## 2022-07-02 MED ORDER — LIDOCAINE HCL (PF) 1 % IJ SOLN
INTRAMUSCULAR | Status: DC | PRN
  Administered 2022-07-02: 2 mL via INTRADERMAL

## 2022-07-02 MED ORDER — TICAGRELOR 90 MG PO TABS
90.0000 mg | ORAL_TABLET | Freq: Two times a day (BID) | ORAL | Status: DC
  Administered 2022-07-02 – 2022-07-03 (×2): 90 mg via ORAL
  Filled 2022-07-02 (×2): qty 1

## 2022-07-02 MED ORDER — SODIUM CHLORIDE 0.9% FLUSH: 3.0000 mL | Freq: Two times a day (BID) | INTRAVENOUS | Status: DC

## 2022-07-02 MED ORDER — IOHEXOL 350 MG/ML SOLN
INTRAVENOUS | Status: DC | PRN
  Administered 2022-07-02: 145 mL

## 2022-07-02 MED ORDER — SODIUM CHLORIDE 0.9% FLUSH: 3.0000 mL | INTRAVENOUS | Status: DC | PRN

## 2022-07-02 MED ORDER — HEPARIN SODIUM (PORCINE) 1000 UNIT/ML IJ SOLN
INTRAMUSCULAR | Status: AC
  Filled 2022-07-02: qty 10

## 2022-07-02 MED ORDER — SODIUM CHLORIDE 0.9 % IV SOLN: INTRAVENOUS | Status: AC

## 2022-07-02 MED ORDER — MIDAZOLAM HCL 2 MG/2ML IJ SOLN
INTRAMUSCULAR | Status: DC | PRN
  Administered 2022-07-02 (×2): 1 mg via INTRAVENOUS

## 2022-07-02 MED ORDER — EMPAGLIFLOZIN 25 MG PO TABS
25.0000 mg | ORAL_TABLET | Freq: Every day | ORAL | Status: DC
  Administered 2022-07-02 – 2022-07-03 (×2): 25 mg via ORAL
  Filled 2022-07-02 (×2): qty 1

## 2022-07-02 MED ORDER — HEPARIN (PORCINE) IN NACL 1000-0.9 UT/500ML-% IV SOLN
INTRAVENOUS | Status: AC
  Filled 2022-07-02: qty 1000

## 2022-07-02 MED ORDER — SODIUM CHLORIDE 0.9 % IV SOLN: 250.0000 mL | INTRAVENOUS | Status: DC | PRN

## 2022-07-02 MED ORDER — ASPIRIN 81 MG PO TBEC
81.0000 mg | DELAYED_RELEASE_TABLET | Freq: Every day | ORAL | Status: DC
  Administered 2022-07-03: 81 mg via ORAL
  Filled 2022-07-02: qty 1

## 2022-07-02 MED ORDER — ACETAMINOPHEN 325 MG PO TABS
650.0000 mg | ORAL_TABLET | ORAL | Status: DC | PRN
Start: 2022-07-02 — End: 2022-07-03
  Administered 2022-07-02: 650 mg via ORAL
  Filled 2022-07-02: qty 2

## 2022-07-02 MED ORDER — GLIMEPIRIDE 2 MG PO TABS
2.0000 mg | ORAL_TABLET | Freq: Every day | ORAL | Status: DC
  Filled 2022-07-02: qty 1

## 2022-07-02 MED ORDER — ASPIRIN 81 MG PO CHEW: 81.0000 mg | CHEWABLE_TABLET | Freq: Every day | ORAL | Status: DC

## 2022-07-02 MED ORDER — TICAGRELOR 90 MG PO TABS
ORAL_TABLET | ORAL | Status: DC | PRN
  Administered 2022-07-02: 180 mg via ORAL

## 2022-07-02 MED ORDER — TICAGRELOR 90 MG PO TABS
ORAL_TABLET | ORAL | Status: AC
  Filled 2022-07-02: qty 2

## 2022-07-02 MED ORDER — OXYCODONE HCL 5 MG PO TABS: 5.0000 mg | ORAL_TABLET | ORAL | Status: DC | PRN

## 2022-07-02 MED ORDER — HYDROCHLOROTHIAZIDE 25 MG PO TABS
25.0000 mg | ORAL_TABLET | Freq: Every morning | ORAL | Status: DC
  Administered 2022-07-02: 25 mg via ORAL
  Filled 2022-07-02: qty 1

## 2022-07-02 MED ORDER — SEMAGLUTIDE(0.25 OR 0.5MG/DOS) 2 MG/3ML ~~LOC~~ SOPN: 0.5000 mg | PEN_INJECTOR | SUBCUTANEOUS | Status: DC

## 2022-07-02 MED ORDER — LABETALOL HCL 5 MG/ML IV SOLN: 10.0000 mg | INTRAVENOUS | Status: AC | PRN

## 2022-07-02 MED ORDER — PIOGLITAZONE HCL 15 MG PO TABS
15.0000 mg | ORAL_TABLET | Freq: Every day | ORAL | Status: DC
  Administered 2022-07-02 – 2022-07-03 (×2): 15 mg via ORAL
  Filled 2022-07-02 (×2): qty 1

## 2022-07-02 MED ORDER — HEPARIN (PORCINE) IN NACL 1000-0.9 UT/500ML-% IV SOLN
INTRAVENOUS | Status: DC | PRN
  Administered 2022-07-02 (×2): 500 mL

## 2022-07-02 MED ORDER — ONDANSETRON HCL 4 MG/2ML IJ SOLN: 4.0000 mg | Freq: Four times a day (QID) | INTRAMUSCULAR | Status: DC | PRN

## 2022-07-02 SURGICAL SUPPLY — 22 items
BALLN SAPPHIRE 2.5X15 (BALLOONS) ×2
BALLN SCOREFLEX 2.50X15 (BALLOONS) ×2
BALLOON SAPPHIRE 2.5X15 (BALLOONS) IMPLANT
BALLOON SCOREFLEX 2.50X15 (BALLOONS) IMPLANT
CATH 5FR JL3.5 JR4 ANG PIG MP (CATHETERS) ×1 IMPLANT
CATH OPTICROSS HD (CATHETERS) ×1 IMPLANT
CATH VISTA GUIDE 6FR XBLAD3.0 (CATHETERS) ×1 IMPLANT
DEVICE RAD COMP TR BAND LRG (VASCULAR PRODUCTS) ×1 IMPLANT
GLIDESHEATH SLEND A-KIT 6F 22G (SHEATH) ×1 IMPLANT
GUIDEWIRE INQWIRE 1.5J.035X260 (WIRE) IMPLANT
INQWIRE 1.5J .035X260CM (WIRE) ×2
KIT ENCORE 26 ADVANTAGE (KITS) ×1 IMPLANT
KIT HEART LEFT (KITS) ×2 IMPLANT
PACK CARDIAC CATHETERIZATION (CUSTOM PROCEDURE TRAY) ×2 IMPLANT
SLED PULL BACK IVUS (MISCELLANEOUS) ×1 IMPLANT
STENT SYNERGY XD 3.0X24 (Permanent Stent) IMPLANT
SYNERGY XD 3.0X24 (Permanent Stent) ×2 IMPLANT
TRANSDUCER W/STOPCOCK (MISCELLANEOUS) ×2 IMPLANT
TUBING CIL FLEX 10 FLL-RA (TUBING) ×2 IMPLANT
WIRE ASAHI PROWATER 180CM (WIRE) ×1 IMPLANT
WIRE COUGAR XT STRL 190CM (WIRE) ×1 IMPLANT
WIRE HI TORQ VERSACORE-J 145CM (WIRE) ×1 IMPLANT

## 2022-07-02 NOTE — Interval H&P Note (Signed)
History and Physical Interval Note:  07/02/2022 9:42 AM  Carla Knight  has presented today for surgery, with the diagnosis of abnormal CTA.  The various methods of treatment have been discussed with the patient and family. After consideration of risks, benefits and other options for treatment, the patient has consented to  Procedure(s): LEFT HEART CATH AND CORONARY ANGIOGRAPHY (N/A) as a surgical intervention.  The patient's history has been reviewed, patient examined, no change in status, stable for surgery.  I have reviewed the patient's chart and labs.  Questions were answered to the patient's satisfaction.     Belva Crome III

## 2022-07-02 NOTE — Telephone Encounter (Signed)
Pharmacy Patient Advocate Encounter  Insurance verification completed.    The patient is insured through [employer]   The patient is currently admitted and ran test claims for the following: Brilinta.  Copays and coinsurance results were relayed to Inpatient clinical team.

## 2022-07-02 NOTE — CV Procedure (Signed)
Tight proximal to mid LAD [facility] 011 with large diagonal 1.  Provisional stenting using a single stent in the LAD crossing over the diagonal with excellent result decreasing 90% stenosis to 0% with TIMI-3.  Ostium of first diagonal 75% with TIMI grade III flow not dilated or treated. We will keep overnight given size of diagonal that was jailed.

## 2022-07-02 NOTE — Progress Notes (Signed)
Patient arrived to [facility]. TR band to Rt Radial artery. Site soft with no active bleeding or hematoma noted. VSS. Report and care transferred to Jan,RCIS at this time. Patient states no pain .

## 2022-07-02 NOTE — TOC Benefit Eligibility Note (Signed)
Patient Teacher, English as a foreign language completed.    The patient is currently admitted and upon discharge could be taking Brilinta 90 mg.  The current 30 day co-pay is $84.03.   The patient is insured through [place], [place] Patient Advocate Specialist [place] Patient Advocate Team Direct Number: [phone]  Fax: [phone]

## 2022-07-03 ENCOUNTER — Other Ambulatory Visit (HOSPITAL_COMMUNITY): Payer: Self-pay

## 2022-07-03 ENCOUNTER — Other Ambulatory Visit: Payer: Self-pay

## 2022-07-03 ENCOUNTER — Encounter (HOSPITAL_COMMUNITY): Payer: Self-pay | Admitting: Interventional Cardiology

## 2022-07-03 ENCOUNTER — Telehealth (HOSPITAL_COMMUNITY): Payer: Self-pay | Admitting: Pharmacy Technician

## 2022-07-03 DIAGNOSIS — I209 Angina pectoris, unspecified: Secondary | ICD-10-CM

## 2022-07-03 DIAGNOSIS — I25119 Atherosclerotic heart disease of native coronary artery with unspecified angina pectoris: Secondary | ICD-10-CM | POA: Diagnosis not present

## 2022-07-03 DIAGNOSIS — R931 Abnormal findings on diagnostic imaging of heart and coronary circulation: Secondary | ICD-10-CM | POA: Diagnosis not present

## 2022-07-03 DIAGNOSIS — I10 Essential (primary) hypertension: Secondary | ICD-10-CM | POA: Diagnosis not present

## 2022-07-03 DIAGNOSIS — E119 Type 2 diabetes mellitus without complications: Secondary | ICD-10-CM | POA: Diagnosis not present

## 2022-07-03 LAB — LIPID PANEL
Cholesterol: 150 mg/dL (ref 0–200)
HDL: 31 mg/dL — ABNORMAL LOW (ref >40)
LDL Cholesterol: 41 mg/dL (ref 0–99)
Total CHOL/HDL Ratio: 4.8 RATIO
Triglycerides: 389 mg/dL — ABNORMAL HIGH (ref <150)
VLDL: 78 mg/dL — ABNORMAL HIGH (ref 0–40)

## 2022-07-03 LAB — CBC
HCT: 46.7 % — ABNORMAL HIGH (ref 36.0–46.0)
Hemoglobin: 15.3 g/dL — ABNORMAL HIGH (ref 12.0–15.0)
MCH: 28.1 pg (ref 26.0–34.0)
MCHC: 32.8 g/dL (ref 30.0–36.0)
MCV: 85.8 fL (ref 80.0–100.0)
Platelets: 361 10*3/uL (ref 150–400)
RBC: 5.44 MIL/uL — ABNORMAL HIGH (ref 3.87–5.11)
RDW: 14.2 % (ref 11.5–15.5)
WBC: 10.2 10*3/uL (ref 4.0–10.5)
nRBC: 0 % (ref 0.0–0.2)

## 2022-07-03 LAB — BASIC METABOLIC PANEL
Anion gap: 11 (ref 5–15)
BUN: 9 mg/dL (ref 6–20)
CO2: 27 mmol/L (ref 22–32)
Calcium: 9.1 mg/dL (ref 8.9–10.3)
Chloride: 100 mmol/L (ref 98–111)
Creatinine, Ser: 0.69 mg/dL (ref 0.44–1.00)
GFR, Estimated: >60 mL/min (ref >60)
Glucose, Bld: 155 mg/dL — ABNORMAL HIGH (ref 70–99)
Potassium: 3.1 mmol/L — ABNORMAL LOW (ref 3.5–5.1)
Sodium: 138 mmol/L (ref 135–145)

## 2022-07-03 LAB — GLUCOSE, CAPILLARY
Glucose-Capillary: 141 mg/dL — ABNORMAL HIGH (ref 70–99)
Glucose-Capillary: 151 mg/dL — ABNORMAL HIGH (ref 70–99)

## 2022-07-03 MED ORDER — TICAGRELOR 90 MG PO TABS
90.0000 mg | ORAL_TABLET | Freq: Two times a day (BID) | ORAL | 11 refills | Status: DC
  Filled 2022-07-03: qty 60, 30d supply, fill #0

## 2022-07-03 MED ORDER — METFORMIN HCL 1000 MG PO TABS
1000.0000 mg | ORAL_TABLET | Freq: Two times a day (BID) | ORAL | Status: AC
Start: 2022-07-04 — End: ?

## 2022-07-03 MED ORDER — POTASSIUM CHLORIDE CRYS ER 20 MEQ PO TBCR
40.0000 meq | EXTENDED_RELEASE_TABLET | ORAL | Status: DC
  Administered 2022-07-03: 40 meq via ORAL
  Filled 2022-07-03: qty 2

## 2022-07-03 MED ORDER — METOPROLOL SUCCINATE ER 25 MG PO TB24
25.0000 mg | ORAL_TABLET | Freq: Every day | ORAL | 11 refills | Status: DC
  Filled 2022-07-03: qty 30, 30d supply, fill #0

## 2022-07-03 MED ORDER — NITROGLYCERIN 0.4 MG SL SUBL
0.4000 mg | SUBLINGUAL_TABLET | SUBLINGUAL | 0 refills | Status: DC | PRN
  Filled 2022-07-03: qty 25, 30d supply, fill #0

## 2022-07-03 MED ORDER — NITROGLYCERIN 0.4 MG SL SUBL: 0.4000 mg | SUBLINGUAL_TABLET | SUBLINGUAL | Status: DC | PRN

## 2022-07-03 MED ORDER — METOPROLOL SUCCINATE ER 25 MG PO TB24
25.0000 mg | ORAL_TABLET | Freq: Every day | ORAL | Status: DC
  Administered 2022-07-03: 25 mg via ORAL
  Filled 2022-07-03: qty 1

## 2022-07-03 MED ORDER — ICOSAPENT ETHYL 1 G PO CAPS
2.0000 g | ORAL_CAPSULE | Freq: Two times a day (BID) | ORAL | 11 refills | Status: DC
  Filled 2022-07-03: qty 120, 30d supply, fill #0

## 2022-07-03 MED ORDER — ATORVASTATIN CALCIUM 80 MG PO TABS
80.0000 mg | ORAL_TABLET | Freq: Every evening | ORAL | 3 refills | Status: DC
  Filled 2022-07-03: qty 30, 30d supply, fill #0

## 2022-07-03 MED FILL — Verapamil HCl IV Soln 2.5 MG/ML: INTRAVENOUS | Qty: 2 | Status: AC

## 2022-07-03 NOTE — Discharge Summary (Addendum)
Discharge Summary    Patient ID: Carla Knight MRN: [id number]; DOB: 1976/06/15  Admit date: 07/02/2022 Discharge date: 07/03/2022  PCP:  Asencion Noble, MD   [facility] Providers Cardiologist:  Janina Mayo, MD       Discharge Diagnoses    Principal Problem:   Angina pectoris without myocardial infarction [facility]) Active Problems:   Dyslipidemia   Primary hypertension   T2DM (type 2 diabetes mellitus) ([place])   Angina pectoris ([place])   Diagnostic Studies/Procedures    Cath: 07/02/22  CONCLUSIONS: 90% [place] 011 proximal to mid LAD stenosis with large diagonal #1. Successful single stent in LAD crossing the diagonal without compromise in reducing the stenosis to 0% with TIMI grade III flow.  Stent size 3.0 x 24 mm Onyx deployed at 14 atm. Left main is normal Circumflex and ramus are widely patent and normal Right coronary is dominant and widely patent. Normal LV function with EF greater than 65%.  LVEDP 14 mmHg.   RECOMMENDATIONS:   Intensify antilipid therapy Aspirin and Brilinta x12 months. Home in a.m. if no chest discomfort or problems.   Diagnostic Dominance: Right  Intervention   _____________   History of Present Illness     Carla Knight is a 46 y.o. female with past medical history of diabetes, hypertension, papillary thyroid cancer status post total thyroidectomy who was referred to cardiology after presentation to the ED with chest pain.  She was seen in the office with Dr. Phineas Inches and reported family history with father having CABG and PCI.  He was recommended for outpatient coronary CTA which was notable for a coronary calcium score of 75, 99th percentile for age and gender, mixed plaque in the proximal LAD with severe stenosis of 70 to 99%.  Recommended for cardiac catheterization.  Which was set up as an outpatient.  Hospital Course     CAD: Underwent cardiac catheterization noted above with 90% proximal/mid LAD stenosis treated with  PCI/DES x1 with jailed diag but TIMI 3 flow.  Widely patent circumflex, ramus and RCA.  LVEF greater than 65% via LV gram, LVEDP 14 mmHg.  Recommendations for DAPT with aspirin/Brilinta for at least 1 year.  Seen by cardiac rehab, able to ambulate without recurrent chest pain. -- Continue aspirin, Brilinta, atorvastatin 80 mg daily, lisinopril 10 mg daily, Toprol XL 22m daily  Hyperlipidemia: LDL 47, HDL 31, triglycerides 389 -- Continue atorvastatin 80 mg daily (increased from 20 mg daily), add Vascepa -- LFTs/FLP in 8 weeks  Diabetes: Continue PTA meds including Jardiance 25 mg daily, glimepiride 2 mg daily, Actos 15 mg daily, Ozempic, and metformin resumed at discharge  Hypertension: Continue lisinopril 10 mg daily, start Toprol XL 227mdaily  -- stop HCTZ at discharge   Hypothyroidism: Continue Synthroid  General: Well developed, well nourished, female appearing in no acute distress. Head: Normocephalic, atraumatic.  Neck: Supple without bruits, JVD. Lungs:  Resp regular and unlabored, CTA. Heart: RRR, S1, S2, no S3, S4, or murmur; no rub. Abdomen: Soft, non-tender, non-distended with normoactive bowel sounds. No hepatomegaly. No rebound/guarding. No obvious abdominal masses. Extremities: No clubbing, cyanosis, edema. Distal pedal pulses are 2+ bilaterally. Right radial cath site stable without bruising or hematoma Neuro: Alert and oriented X 3. Moves all extremities spontaneously. Psych: Normal affect.  Patient was seen by Dr. CoBurt Knacknd deemed stable for discharge home. Follow up in the office arranged. Medications sent to [facility] by PharmD prior to discharge.   Did the patient have  an acute coronary syndrome (MI, NSTEMI, STEMI, etc) this admission?:  No                               Did the patient have a percutaneous coronary intervention (stent / angioplasty)?:  Yes.     Cath/PCI Registry Performance & Quality Measures: Aspirin prescribed? - Yes ADP Receptor  Inhibitor (Plavix/Clopidogrel, Brilinta/Ticagrelor or Effient/Prasugrel) prescribed (includes medically managed patients)? - Yes High Intensity Statin (Lipitor 40-54m or Crestor 20-463m prescribed? - Yes For EF <40%, was ACEI/ARB prescribed? - Not Applicable (EF >/= 4058%For EF <40%, Aldosterone Antagonist (Spironolactone or Eplerenone) prescribed? - Not Applicable (EF >/= 4052%Cardiac Rehab Phase II ordered? - Yes     The patient will be scheduled for a TOC follow up appointment in 10-14 days.  [facility] at the office where the patient should be seen for follow up.  _____________  Discharge Vitals Blood pressure 130/88, pulse 89, temperature 98 F (36.7 C), temperature source Oral, resp. rate 15, height _0  (1.6 m), weight 76.2 kg, SpO2 98 %.  Filed Weights   07/02/22 0732  Weight: 76.2 kg    Labs & Radiologic Studies    CBC Recent Labs    07/03/22 0622  WBC 10.2  HGB 15.3*  HCT 46.7*  MCV 85.8  PLT 36778 Basic Metabolic Panel Recent Labs    07/03/22 0622  NA 138  K 3.1*  CL 100  CO2 27  GLUCOSE 155*  BUN 9  CREATININE 0.69  CALCIUM 9.1   Liver Function Tests No results for input(s): "AST", "ALT", "ALKPHOS", "BILITOT", "PROT", "ALBUMIN" in the last 72 hours. No results for input(s): "LIPASE", "AMYLASE" in the last 72 hours. High Sensitivity Troponin:   No results for input(s): "TROPONINIHS" in the last 720 hours.  BNP Invalid input(s): "POCBNP" D-Dimer No results for input(s): "DDIMER" in the last 72 hours. Hemoglobin A1C No results for input(s): "HGBA1C" in the last 72 hours. Fasting Lipid Panel Recent Labs    07/03/22 0622  CHOL 150  HDL 31*  LDLCALC 41  TRIG 389*  CHOLHDL 4.8   Thyroid Function Tests No results for input(s): "TSH", "T4TOTAL", "T3FREE", "THYROIDAB" in the last 72 hours.  Invalid input(s): "FREET3" _____________  CARDIAC CATHETERIZATION  Result Date: 07/02/2022 CONCLUSIONS:  90% Med[place] 011 proximal to mid LAD stenosis with large diagonal #1. Successful single stent in LAD crossing the diagonal without compromise in reducing the stenosis to 0% with TIMI grade III flow.  Stent size 3.0 x 24 mm Onyx deployed at 14 atm. Left main is normal Circumflex and ramus are widely patent and normal Right coronary is dominant and widely patent. Normal LV function with EF greater than 65%.  LVEDP 14 mmHg. RECOMMENDATIONS: Intensify antilipid therapy Aspirin and Brilinta x12 months. Home in a.m. if no chest discomfort or problems.   CT CORONARY FRACTIONAL FLOW RESERVE DATA PREP  Result Date: 06/25/2022 EXAM: FFRCT ANALYSIS FINDINGS: FFRct analysis was performed on the original cardiac CT angiogram dataset. Diagrammatic representation of the FFRct analysis is provided in a separate PDF document in PACS. This dictation was created using the PDF document and an interactive 3D model of the results. 3D model is not available in the EMR/PACS. Normal FFR range is >0.80. 1. Left Main: No significant stenosis 2. LAD: CTFFR 0.77 across lesion in proximal LAD 3. LCX: No significant  stenosis 4. Ramus: No significant stenosis 5. RCA: No significant stenosis IMPRESSION: 1. CTFFR 0.77 across lesion in proximal LAD, suggesting lesion is functionally significant. Cardiac catheterization recommended Electronically Signed   By: Oswaldo Milian M.D.   On: 06/25/2022 16:02   CT CORONARY FRACTIONAL FLOW RESERVE FLUID ANALYSIS  Result Date: 06/25/2022 EXAM: FFRCT ANALYSIS FINDINGS: FFRct analysis was performed on the original cardiac CT angiogram dataset. Diagrammatic representation of the FFRct analysis is provided in a separate PDF document in PACS. This dictation was created using the PDF document and an interactive 3D model of the results. 3D model is not available in the EMR/PACS. Normal FFR range is >0.80. 1. Left Main: No significant stenosis 2. LAD: CTFFR 0.77 across lesion in proximal LAD 3. LCX: No  significant stenosis 4. Ramus: No significant stenosis 5. RCA: No significant stenosis IMPRESSION: 1. CTFFR 0.77 across lesion in proximal LAD, suggesting lesion is functionally significant. Cardiac catheterization recommended Electronically Signed   By: Oswaldo Milian M.D.   On: 06/25/2022 16:02   CT CORONARY MORPH W/CTA COR W/SCORE W/CA W/CM &/OR WO/CM  Addendum Date: 06/25/2022   ADDENDUM REPORT: 06/25/2022 15:56 CLINICAL DATA:  31F with chest pain EXAM: Cardiac/Coronary CTA TECHNIQUE: The patient was scanned on a [employer]. FINDINGS: A 100 kV prospective scan was triggered in the descending thoracic aorta at 111 HU's. Axial non-contrast 3 mm slices were carried out through the heart. The data set was analyzed on a dedicated work station and scored using the [employer]. Gantry rotation speed was 250 msecs and collimation was .6 mm. 0.8 mg of sl NTG was given. The 3D data set was reconstructed in 5% intervals of the 35-75 % of the R-R cycle. Phases were analyzed on a dedicated work station using MPR, MIP and VRT modes. The patient received 100 cc of contrast. Coronary Arteries:  Normal coronary origin.  Right dominance. RCA is a large dominant artery that gives rise to PDA and PLA. Calcified plaque in the mid RCA causes 0-24% stenosis Left main is a large artery that gives rise to LAD and LCX arteries. LAD is a large vessel. Mixed plaque in the proximal LAD causes severe (70-99%) stenosis LCX is a non-dominant artery that gives rise to one large OM1 branch. There is no plaque. Ramus has no plaque. Other findings: Left Ventricle: Normal size Left Atrium: Normal size Pulmonary Veins: Normal configuration Right Ventricle: Normal size Right Atrium: Normal size Cardiac valves: No calcifications Thoracic aorta: Normal size Pulmonary Arteries: Normal size Systemic Veins: Normal drainage Pericardium: Normal thickness IMPRESSION: 1. Coronary calcium score of 75. This was 99th percentile for age  and sex matched control. 2. Normal coronary origin with right dominance. 3. Obstructive CAD 4. Mixed plaque in the proximal LAD causes severe (70-99%) stenosis 5. Cardiac catheterization recommended CAD-RADS 4 Severe stenosis. (70-99% or > 50% left main). Cardiac catheterization or CT FFR is recommended. Consider symptom-guided anti-ischemic pharmacotherapy as well as risk factor modification per guideline directed care. Electronically Signed   By: Oswaldo Milian M.D.   On: 06/25/2022 15:56   Result Date: 06/25/2022 EXAM: OVER-READ INTERPRETATION  CT CHEST The following report is a limited chest CT over-read performed by radiologist Dr. Marijo Conception of [facility], [place] on 06/25/2022.The coronary CTA interpretation by the cardiologist is attached. COMPARISON:  None Available. FINDINGS: Vascular: Visualized extracardiac vascular structures are unremarkable. Mediastinum/Nodes: Visualized mediastinum is unremarkable. Lungs/Pleura: Visualized pulmonary parenchyma is unremarkable. Upper Abdomen: Visualized upper abdomen is unremarkable. Musculoskeletal: No  chest wall mass or suspicious bone lesions identified. IMPRESSION: No definite abnormality seen involving the visualized extracardiac structures of the chest. Electronically Signed: By: Marijo Conception M.D. On: 06/25/2022 14:14    Disposition   Pt is being discharged home today in good condition.  Follow-up Plans & Appointments     Follow-up Information     Lenna Sciara, NP Follow up on 07/12/2022.   Specialties: Nurse Practitioner, Family Medicine Why: at 8:25am for your follow up appt with Dr. Percell Locus' NP Maris Berger information: [address] [place] [address] [phone]                Discharge Instructions     Amb Referral to Cardiac Rehabilitation   Complete by: As directed    Diagnosis: Coronary Stents   After initial evaluation and assessments completed: Virtual Based Care may be provided  alone or in conjunction with Phase 2 Cardiac Rehab based on patient barriers.: Yes   Call MD for:  difficulty breathing, headache or visual disturbances   Complete by: As directed    Call MD for:  persistant dizziness or light-headedness   Complete by: As directed    Call MD for:  redness, tenderness, or signs of infection (pain, swelling, redness, odor or green/yellow discharge around incision site)   Complete by: As directed    Diet - low sodium heart healthy   Complete by: As directed    Discharge instructions   Complete by: As directed    Radial Site Care Refer to this sheet in the next few weeks. These instructions provide you with information on caring for yourself after your procedure. Your caregiver may also give you more specific instructions. Your treatment has been planned according to current medical practices, but problems sometimes occur. Call your caregiver if you have any problems or questions after your procedure. HOME CARE INSTRUCTIONS You may shower the day after the procedure. Remove the bandage (dressing) and gently wash the site with plain soap and water. Gently pat the site dry.  Do not apply powder or lotion to the site.  Do not submerge the affected site in water for 3 to 5 days.  Inspect the site at least twice daily.  Do not flex or bend the affected arm for 24 hours.  No lifting over 5 pounds (2.3 kg) for 5 days after your procedure.  Do not drive home if you are discharged the same day of the procedure. Have someone else drive you.  You may drive 24 hours after the procedure unless otherwise instructed by your caregiver.  What to expect: Any bruising will usually fade within 1 to 2 weeks.  Blood that collects in the tissue (hematoma) may be painful to the touch. It should usually decrease in size and tenderness within 1 to 2 weeks.  SEEK IMMEDIATE MEDICAL CARE IF: You have unusual pain at the radial site.  You have redness, warmth, swelling, or pain at the  radial site.  You have drainage (other than a small amount of blood on the dressing).  You have chills.  You have a fever or persistent symptoms for more than 72 hours.  You have a fever and your symptoms suddenly get worse.  Your arm becomes pale, cool, tingly, or numb.  You have heavy bleeding from the site. Hold pressure on the site.   PLEASE DO NOT MISS ANY DOSES OF YOUR BRILINTA!!!!! Also keep a log of you blood pressures and bring back to  your follow up appt. Please call the office with any questions.   Patients taking blood thinners should generally stay away from medicines like ibuprofen, Advil, Motrin, naproxen, and Aleve due to risk of stomach bleeding. You may take Tylenol as directed or talk to your primary doctor about alternatives.   PLEASE ENSURE THAT YOU DO NOT RUN OUT OF YOUR BRILINTA. This medication is very important to remain on for at least one year. IF you have issues obtaining this medication due to cost please CALL the office 3-5 business days prior to running out in order to prevent missing doses of this medication.   Increase activity slowly   Complete by: As directed        Discharge Medications   Allergies as of 07/03/2022       Reactions   Adhesive [tape] Hives        Medication List     STOP taking these medications    hydrochlorothiazide 25 MG tablet Commonly known as: HYDRODIURIL   ibuprofen 200 MG tablet Commonly known as: ADVIL   meloxicam 15 MG tablet Commonly known as: MOBIC       TAKE these medications    ALLERGY EYE DROPS OP Place 1 drop into both eyes daily.   aspirin EC 81 MG tablet Take 1 tablet (81 mg total) by mouth daily. Swallow whole. What changed:  how much to take when to take this   atorvastatin 80 MG tablet Commonly known as: LIPITOR Take 1 tablet (80 mg total) by mouth every evening. What changed:  medication strength how much to take   Brilinta 90 MG Tabs tablet Generic drug: ticagrelor Take 1  tablet (90 mg total) by mouth 2 (two) times daily.   Childrens Gummies [place] 2 tablets by mouth every evening.   diphenhydrAMINE spray Commonly known as: BENADRYL Apply 1 Application topically every 4 (four) hours as needed for itching.   glimepiride 2 MG tablet Commonly known as: AMARYL Take 2 mg by mouth daily with lunch.   icosapent Ethyl 1 g capsule Commonly known as: Vascepa Take 2 capsules (2 g total) by mouth 2 (two) times daily.   Jardiance 25 MG Tabs tablet Generic drug: empagliflozin Take 25 mg by mouth daily.   lisinopril 10 MG tablet Commonly known as: ZESTRIL Take 10 mg by mouth every morning.   metFORMIN 1000 MG tablet Commonly known as: GLUCOPHAGE Take 1 tablet (1,000 mg total) by mouth 2 (two) times daily. Start taking on: July 04, 2022   metoprolol succinate 25 MG 24 hr tablet Commonly known as: Toprol XL Take 1 tablet (25 mg total) by mouth daily.   nitroGLYCERIN 0.4 MG SL tablet Commonly known as: NITROSTAT Place 1 tablet (0.4 mg total) under the tongue every 5 (five) minutes as needed for chest pain.   Ozempic (0.25 or 0.5 MG/DOSE) 2 MG/3ML Sopn Generic drug: Semaglutide(0.25 or 0.5MG/DOS) Inject 0.5 mg into the skin once a week.   pioglitazone 15 MG tablet Commonly known as: ACTOS Take 15 mg by mouth daily.   Synthroid 150 MCG tablet Generic drug: levothyroxine Take 1 tablet (150 mcg total) by mouth daily before breakfast.           Outstanding Labs/Studies   FLP/LFTs in 8 weeks   Duration of Discharge Encounter   Greater than 30 minutes including physician time.  Signed, Reino Bellis, NP 07/03/2022, 10:36 AM   Patient seen, examined. Available data reviewed. Agree with findings, assessment, and plan as outlined by  Reino Bellis, NP.  Patient is independently interviewed and examined.  She is alert, oriented, no distress.  Lungs are clear, heart regular rate rhythm no murmur gallop, abdomen soft nontender, extremities  with no edema, right radial site is clear with no hematoma or ecchymosis.  Cardiac catheterization films reviewed and demonstrate a nice result with PCI of a complex lesion in the proximal LAD with a jailed diagonal that exhibits TIMI-3 flow post PCI.  The patient is medically stable for discharge today on medical therapy as outlined above with DAPT using aspirin and ticagrelor, aggressive lipid-lowering, lisinopril in the setting of diabetes, and a beta-blocker with metoprolol succinate.  Sherren Mocha, M.D. 07/03/2022 10:36 AM

## 2022-07-03 NOTE — Telephone Encounter (Signed)
Patient Advocate Encounter  Prior Authorization for icosapent Ethyl (Vascepa) 1 g capsules has been approved.    PA# [id number] Key: [id number] Effective dates: 07/03/2022 through 07/03/2023  Patients co-pay is $10.00.     Lyndel Safe, [place] Patient Advocate Specialist [place] Patient Advocate Team Direct Number: [phone]  Fax: [phone]

## 2022-07-03 NOTE — Progress Notes (Signed)
CARDIAC REHAB PHASE I   PRE:  Rate/Rhythm: 85, SR  BP:  Supine: 124/89   SaO2: not taken  MODE:  Ambulation: 470 ft   POST:  Rate/Rhythm: 95, SR  BP:  Sitting: 130/88     SaO2: not taken  Pt tolerated ambulation well independently with standby assist and no s/s. C/o some incision site tenderness. Pt and husband educated on risk factors, exercise guidelines, angina/NTG, ASA and brilinta, site care, restrictions, nutrition, smoking cessation, and orientation to CRP2 referral to AP. All questions from pt and family answered, and pt left in bed with call bell in reach.  [place], MS 07/03/2022 9:06 AM

## 2022-07-04 LAB — LIPOPROTEIN A (LPA): Lipoprotein (a): 75.2 nmol/L — ABNORMAL HIGH (ref <75.0)

## 2022-07-11 NOTE — Progress Notes (Signed)
Office Visit    Patient Name: Carla Knight Date of Encounter: 07/12/2022  Primary Care Provider:  Asencion Noble, MD Primary Cardiologist:  Janina Mayo, MD  Chief Complaint    46 year old female with a history of CAD s/p DES-LAD in 06/2022, hypertension, hyperlipidemia, type 2 diabetes, and papillary thyroid cancer s/p total thyroidectomy who presents for post-hospital follow-up related to CAD.  Past Medical History     Past Medical History:  Diagnosis Date   Diabetes mellitus    NIDDM, type 2    Dyslipidemia    Goiter    no current meds.   Hypertension    under control; has been on med. x 5-6 yrs.   Hypothyroidism    Papillary thyroid carcinoma ([place])    incidentally found during total thyroidectomy , neegative for metastasis   Parotid mass 11/2011   right   PONV (postoperative nausea and vomiting)    nauseated when ate after surgery   Thyroid mass    Past Surgical History:  Procedure Laterality Date   ABLATION  2016 or 2017 unsure   obgyn dr Pamala Hurry    CESAREAN SECTION  10/18/2008   CESAREAN SECTION W/BTL  04/08/2011   CORONARY STENT INTERVENTION N/A 07/02/2022   Procedure: CORONARY STENT INTERVENTION;  Surgeon: Belva Crome, MD;  Location: [facility];  Service: Cardiovascular;  Laterality: N/A;   LAPAROSCOPIC CHOLECYSTECTOMY  06/05/2009   LEFT HEART CATH AND CORONARY ANGIOGRAPHY N/A 07/02/2022   Procedure: LEFT HEART CATH AND CORONARY ANGIOGRAPHY;  Surgeon: Belva Crome, MD;  Location: [facility];  Service: Cardiovascular;  Laterality: N/A;   PAROTIDECTOMY  12/17/2011   Procedure: PAROTIDECTOMY;  Surgeon: Ascencion Dike, MD;  Location: [facility];  Service: ENT;  Laterality: Right;  Total right parotidectomy   ROBOTIC ASSISTED LAPAROSCOPIC HYSTERECTOMY AND SALPINGECTOMY Bilateral 05/01/2018   Procedure: XI ROBOTIC ASSISTED LAPAROSCOPIC HYSTERECTOMY AND SALPINGECTOMY;  Surgeon: Aloha Gell, MD;  Location: [facility];  Service: Gynecology;   Laterality: Bilateral;  Requests 3 1/2 hrs.   ROBOTIC ASSISTED LAPAROSCOPIC LYSIS OF ADHESION  05/01/2018   Procedure: XI ROBOTIC ASSISTED LAPAROSCOPIC LYSIS OF ADHESION;  Surgeon: Aloha Gell, MD;  Location: [facility];  Service: Gynecology;;   THYROIDECTOMY  05/13/2012   Procedure: THYROIDECTOMY;  Surgeon: Ascencion Dike, MD;  Location: [facility];  Service: ENT;  Laterality: Bilateral;  TOTAL THYROIDECTOMY    Allergies  Allergies  Allergen Reactions   Adhesive [Tape] Hives    History of Present Illness    46 year old female with a history of CAD s/p DES-LAD in 06/2022, hypertension, hyperlipidemia, type 2 diabetes, papillary thyroid cancer s/p total thyroidectomy.  She was referred to cardiology in 05/2022 following an ED visit for chest pain.  ACS was ruled out in the ED. Coronary CTA showed coronary calcium score of 75 (99th percentile), significant lesion in proximal LAD. She underwent cardiac catheterization on 07/02/2022 which revealed 90% p-mLAD stenosis s/p DES, otherwise normal coronary arteries, EF > 65%.  She was started on aspirin and Brilinta.  Atorvastatin was increased from 20 mg daily to 80 mg daily.  She was started on Vascepa.  HCTZ was discontinued and she was started on Toprol-XL 25 mg daily.  She was observed overnight and discharged home in stable condition on 07/03/2022.  She presents today for follow-up.  Since her procedure he has been stable from a cardiac standpoint.  She does note some mild fatigue and mild diarrhea.  She wonders if  this is a side effect of new medications she recently started Vascepa, Metoprolol, and Ozempic.  Additionally, atorvastatin was increased.  She denies symptoms concerning for angina.  She is hoping to participate in cardiac rehab and is ready to return to work.  Other than her fatigue, she denies any additional concerns today.  Home Medications    Current Outpatient Medications  Medication Sig Dispense Refill   aspirin EC 81 MG tablet Take 1  tablet (81 mg total) by mouth daily. Swallow whole. (Patient taking differently: Take 162 mg by mouth at bedtime. Swallow whole.) 90 tablet 3   atorvastatin (LIPITOR) 80 MG tablet Take 1 tablet (80 mg total) by mouth every evening. 90 tablet 3   diphenhydrAMINE (BENADRYL) spray Apply 1 Application topically every 4 (four) hours as needed for itching.     glimepiride (AMARYL) 2 MG tablet Take 2 mg by mouth daily with lunch.     icosapent Ethyl (VASCEPA) 1 g capsule Take 2 capsules (2 g total) by mouth 2 (two) times daily. 120 capsule 11   JARDIANCE 25 MG TABS tablet Take 25 mg by mouth daily.     Ketotifen Fumarate (ALLERGY EYE DROPS OP) Place 1 drop into both eyes daily.     lisinopril (PRINIVIL,ZESTRIL) 10 MG tablet Take 10 mg by mouth every morning.      metFORMIN (GLUCOPHAGE) 1000 MG tablet Take 1 tablet (1,000 mg total) by mouth 2 (two) times daily.     metoprolol succinate (TOPROL XL) 25 MG 24 hr tablet Take 1 tablet (25 mg total) by mouth daily. 30 tablet 11   nitroGLYCERIN (NITROSTAT) 0.4 MG SL tablet Place 1 tablet (0.4 mg total) under the tongue every 5 (five) minutes as needed for chest pain. 25 tablet 0   OZEMPIC, 0.25 OR 0.5 MG/DOSE, 2 MG/3ML SOPN Inject 0.5 mg into the skin once a week.     Pediatric Multivit-Minerals-C (CHILDRENS GUMMIES) CHEW Chew 2 tablets by mouth every evening.     pioglitazone (ACTOS) 15 MG tablet Take 15 mg by mouth daily.     SYNTHROID 150 MCG tablet Take 1 tablet (150 mcg total) by mouth daily before breakfast. 90 tablet 3   ticagrelor (BRILINTA) 90 MG TABS tablet Take 1 tablet (90 mg total) by mouth 2 (two) times daily. 60 tablet 11   No current facility-administered medications for this visit.     Review of Systems   She denies chest pain, palpitations, dyspnea, pnd, orthopnea, n, v, dizziness, syncope, edema, weight gain, or early satiety. All other systems reviewed and are otherwise negative except as noted above.     Cardiac Rehabilitation  Eligibility Assessment  The patient is ready to start cardiac rehabilitation from a cardiac standpoint.    Physical Exam    VS:  BP 110/66   Pulse 82   Ht '5\' 3"'$  (1.6 m)   Wt 170 lb 9.6 oz (77.4 kg)   SpO2 98%   BMI 30.22 kg/m   GEN: Well nourished, well developed, in no acute distress. HEENT: normal. Neck: Supple, no JVD, carotid bruits, or masses. Cardiac: RRR, no murmurs, rubs, or gallops. No clubbing, cyanosis, edema.  Radials/DP/PT 2+ and equal bilaterally. R radial cath site without bruising, bleeding or hematoma.  Respiratory:  Respirations regular and unlabored, clear to auscultation bilaterally. GI: Soft, nontender, nondistended, BS + x 4. MS: no deformity or atrophy. Skin: warm and dry, no rash. Neuro:  Strength and sensation are intact. Psych: Normal affect.  Accessory Clinical Findings  ECG personally reviewed by me today -sinus rhythm, 82 bpm, nonspecific ST/T wave changes, suspected arm lead reversal-No acute changes.    Lab Results  Component Value Date   WBC 10.2 07/03/2022   HGB 15.3 (H) 07/03/2022   HCT 46.7 (H) 07/03/2022   MCV 85.8 07/03/2022   PLT 361 07/03/2022   Lab Results  Component Value Date   CREATININE 0.69 07/03/2022   BUN 9 07/03/2022   NA 138 07/03/2022   K 3.1 (L) 07/03/2022   CL 100 07/03/2022   CO2 27 07/03/2022   Lab Results  Component Value Date   ALT 9 04/10/2011   AST 10 04/10/2011   ALKPHOS 107 04/10/2011   BILITOT 0.2 (L) 04/10/2011   Lab Results  Component Value Date   CHOL 150 07/03/2022   HDL 31 (L) 07/03/2022   LDLCALC 41 07/03/2022   TRIG 389 (H) 07/03/2022   CHOLHDL 4.8 07/03/2022    Lab Results  Component Value Date   HGBA1C 7.2 (H) 04/29/2018    Assessment & Plan    1. CAD: S/p DES-LAD in 06/2022, otherwise normal coronary arteries. Stable with no anginal symptoms. No indication for ischemic evaluation.  She does note some mild fatigue, diarrhea.  She thinks this is likely a side effect of medication.   Continue to monitor.  Encouraged cardiac rehab.  Continue aspirin, Brilinta, lisinopril, metoprolol, Vascepa, and Lipitor.  2. Hypertension: BP well controlled. Continue current antihypertensive regimen.  Potassium was low prior to cath and was supplemented.  Will recheck BMET today.   3. Hyperlipidemia:  LDL was 41 in 06/2022. Lipitor recently increased, Vascepa added to regimen for elevated triglycerides (389). Repeat lipids, LFTs in 6 weeks.  Continue Lipitor, Vascepa.  4. Type 2 diabetes: A1c was 7.3 in 04/2022.  Monitored and managed per PCP.  Continue Ozempic, Jardiance.  5. Hypothyroidism: TSH was 0.430 in 04/2022.  Follows with endocrinology.   6. Disposition: Follow-up in 3 months.      Lenna Sciara, NP 07/12/2022, 8:47 AM

## 2022-07-12 ENCOUNTER — Encounter: Payer: Self-pay | Admitting: Nurse Practitioner

## 2022-07-12 ENCOUNTER — Ambulatory Visit (INDEPENDENT_AMBULATORY_CARE_PROVIDER_SITE_OTHER): Payer: Commercial Managed Care - PPO | Admitting: Nurse Practitioner

## 2022-07-12 VITALS — BP 110/66 | HR 82 | Ht 63.0 in | Wt 170.6 lb

## 2022-07-12 DIAGNOSIS — E785 Hyperlipidemia, unspecified: Secondary | ICD-10-CM

## 2022-07-12 DIAGNOSIS — I10 Essential (primary) hypertension: Secondary | ICD-10-CM

## 2022-07-12 DIAGNOSIS — I2510 Atherosclerotic heart disease of native coronary artery without angina pectoris: Secondary | ICD-10-CM

## 2022-07-12 DIAGNOSIS — E1159 Type 2 diabetes mellitus with other circulatory complications: Secondary | ICD-10-CM | POA: Diagnosis not present

## 2022-07-12 DIAGNOSIS — Z79899 Other long term (current) drug therapy: Secondary | ICD-10-CM

## 2022-07-12 DIAGNOSIS — E890 Postprocedural hypothyroidism: Secondary | ICD-10-CM

## 2022-07-12 DIAGNOSIS — Z794 Long term (current) use of insulin: Secondary | ICD-10-CM

## 2022-07-12 LAB — BASIC METABOLIC PANEL
BUN/Creatinine Ratio: 11 (ref 9–23)
BUN: 7 mg/dL (ref 6–24)
CO2: 17 mmol/L — ABNORMAL LOW (ref 20–29)
Calcium: 9.2 mg/dL (ref 8.7–10.2)
Chloride: 105 mmol/L (ref 96–106)
Creatinine, Ser: 0.64 mg/dL (ref 0.57–1.00)
Glucose: 110 mg/dL — ABNORMAL HIGH (ref 70–99)
Potassium: 4 mmol/L (ref 3.5–5.2)
Sodium: 142 mmol/L (ref 134–144)
eGFR: 110 mL/min/{1.73_m2} (ref >59)

## 2022-07-12 NOTE — Patient Instructions (Addendum)
Medication Instructions:  Your physician recommends that you continue on your current medications as directed. Please refer to the Current Medication list given to you today.   *If you need a refill on your cardiac medications before your next appointment, please call your pharmacy*   Lab Work: Your physician recommends that you complete labs today and return in 6 weeks for labs. BMET (Today) Fasting lipids& LFTs (6 weeks)  If you have labs (blood work) drawn today and your tests are completely normal, you will receive your results only by: [place] (if you have MyChart) OR A paper copy in the mail If you have any lab test that is abnormal or we need to change your treatment, we will call you to review the results.   Testing/Procedures: NONE ordered at this time of appointment     Follow-Up: At [facility], you and your health needs are our priority.  As part of our continuing mission to provide you with exceptional heart care, we have created designated Provider Care Teams.  These Care Teams include your primary Cardiologist (physician) and Advanced Practice Providers (APPs -  Physician Assistants and Nurse Practitioners) who all work together to provide you with the care you need, when you need it.  We recommend signing up for the patient portal called "MyChart".  Sign up information is provided on this After Visit Summary.  MyChart is used to connect with patients for Virtual Visits (Telemedicine).  Patients are able to view lab/test results, encounter notes, upcoming appointments, etc.  Non-urgent messages can be sent to your provider as well.   To learn more about what you can do with MyChart, go to [email or web address].    Your next appointment:   3 month(s)  The format for your next appointment:   In Person  Provider:   Janina Mayo, MD  or Diona Browner, NP        Other Instructions Hypertension, Adult High blood pressure (hypertension) is when the force  of blood pumping through the arteries is too strong. The arteries are the blood vessels that carry blood from the heart throughout the body. Hypertension forces the heart to work harder to pump blood and may cause arteries to become narrow or stiff. Untreated or uncontrolled hypertension can lead to a heart attack, heart failure, a stroke, kidney disease, and other problems. A blood pressure reading consists of a higher number over a lower number. Ideally, your blood pressure should be below 120/80. The first ("top") number is called the systolic pressure. It is a measure of the pressure in your arteries as your heart beats. The second ("bottom") number is called the diastolic pressure. It is a measure of the pressure in your arteries as the heart relaxes. What are the causes? The exact cause of this condition is not known. There are some conditions that result in high blood pressure. What increases the risk? Certain factors may make you more likely to develop high blood pressure. Some of these risk factors are under your control, including: Smoking. Not getting enough exercise or physical activity. Being overweight. Having too much fat, sugar, calories, or salt (sodium) in your diet. Drinking too much alcohol. Other risk factors include: Having a personal history of heart disease, diabetes, high cholesterol, or kidney disease. Stress. Having a family history of high blood pressure and high cholesterol. Having obstructive sleep apnea. Age. The risk increases with age. What are the signs or symptoms? High blood pressure may not cause symptoms. Very high blood  pressure (hypertensive crisis) may cause: Headache. Fast or irregular heartbeats (palpitations). Shortness of breath. Nosebleed. Nausea and vomiting. Vision changes. Severe chest pain, dizziness, and seizures. How is this diagnosed? This condition is diagnosed by measuring your blood pressure while you are seated, with your arm resting  on a flat surface, your legs uncrossed, and your feet flat on the floor. The cuff of the blood pressure monitor will be placed directly against the skin of your upper arm at the level of your heart. Blood pressure should be measured at least twice using the same arm. Certain conditions can cause a difference in blood pressure between your right and left arms. If you have a high blood pressure reading during one visit or you have normal blood pressure with other risk factors, you may be asked to: Return on a different day to have your blood pressure checked again. Monitor your blood pressure at home for 1 week or longer. If you are diagnosed with hypertension, you may have other blood or imaging tests to help your health care provider understand your overall risk for other conditions. How is this treated? This condition is treated by making healthy lifestyle changes, such as eating healthy foods, exercising more, and reducing your alcohol intake. You may be referred for counseling on a healthy diet and physical activity. Your health care provider may prescribe medicine if lifestyle changes are not enough to get your blood pressure under control and if: Your systolic blood pressure is above 130. Your diastolic blood pressure is above 80. Your personal target blood pressure may vary depending on your medical conditions, your age, and other factors. Follow these instructions at home: Eating and drinking  Eat a diet that is high in fiber and potassium, and low in sodium, added sugar, and fat. An example of this eating plan is called the DASH diet. DASH stands for Dietary Approaches to Stop Hypertension. To eat this way: Eat plenty of fresh fruits and vegetables. Try to fill one half of your plate at each meal with fruits and vegetables. Eat whole grains, such as whole-wheat pasta, brown rice, or whole-grain bread. Fill about one fourth of your plate with whole grains. Eat or drink low-fat dairy products,  such as skim milk or low-fat yogurt. Avoid fatty cuts of meat, processed or cured meats, and poultry with skin. Fill about one fourth of your plate with lean proteins, such as fish, chicken without skin, beans, eggs, or tofu. Avoid pre-made and processed foods. These tend to be higher in sodium, added sugar, and fat. Reduce your daily sodium intake. Many people with hypertension should eat less than 1,500 mg of sodium a day. Do not drink alcohol if: Your health care provider tells you not to drink. You are pregnant, may be pregnant, or are planning to become pregnant. If you drink alcohol: Limit how much you have to: 0-1 drink a day for women. 0-2 drinks a day for men. Know how much alcohol is in your drink. In the [place] one drink equals one 12 oz bottle of beer (355 mL), one 5 oz glass of wine (148 mL), or one 1 oz glass of hard liquor (44 mL). Lifestyle  Work with your health care provider to maintain a healthy body weight or to lose weight. Ask what an ideal weight is for you. Get at least 30 minutes of exercise that causes your heart to beat faster (aerobic exercise) most days of the week. Activities may include walking, swimming, or biking. Include exercise  to strengthen your muscles (resistance exercise), such as Pilates or lifting weights, as part of your weekly exercise routine. Try to do these types of exercises for 30 minutes at least 3 days a week. Do not use any products that contain nicotine or tobacco. These products include cigarettes, chewing tobacco, and vaping devices, such as e-cigarettes. If you need help quitting, ask your health care provider. Monitor your blood pressure at home as told by your health care provider. Keep all follow-up visits. This is important. Medicines Take over-the-counter and prescription medicines only as told by your health care provider. Follow directions carefully. Blood pressure medicines must be taken as prescribed. Do not skip doses of blood  pressure medicine. Doing this puts you at risk for problems and can make the medicine less effective. Ask your health care provider about side effects or reactions to medicines that you should watch for. Contact a health care provider if you: Think you are having a reaction to a medicine you are taking. Have headaches that keep coming back (recurring). Feel dizzy. Have swelling in your ankles. Have trouble with your vision. Get help right away if you: Develop a severe headache or confusion. Have unusual weakness or numbness. Feel faint. Have severe pain in your chest or abdomen. Vomit repeatedly. Have trouble breathing. These symptoms may be an emergency. Get help right away. Call 911. Do not wait to see if the symptoms will go away. Do not drive yourself to the hospital. Summary Hypertension is when the force of blood pumping through your arteries is too strong. If this condition is not controlled, it may put you at risk for serious complications. Your personal target blood pressure may vary depending on your medical conditions, your age, and other factors. For most people, a normal blood pressure is less than 120/80. Hypertension is treated with lifestyle changes, medicines, or a combination of both. Lifestyle changes include losing weight, eating a healthy, low-sodium diet, exercising more, and limiting alcohol. This information is not intended to replace advice given to you by your health care provider. Make sure you discuss any questions you have with your health care provider. Document Revised: 09/11/2021 Document Reviewed: 09/11/2021 Elsevier Patient Education  [place]

## 2022-07-23 ENCOUNTER — Other Ambulatory Visit (HOSPITAL_COMMUNITY): Payer: Self-pay

## 2022-08-05 ENCOUNTER — Encounter (HOSPITAL_COMMUNITY): Payer: Self-pay

## 2022-08-05 ENCOUNTER — Encounter (HOSPITAL_COMMUNITY)
Admission: RE | Admit: 2022-08-05 | Discharge: 2022-08-05 | Disposition: A | Discharge status: Home / Self Care | Payer: Commercial Managed Care - PPO | Source: Ambulatory Visit | Attending: Internal Medicine | Admitting: Internal Medicine

## 2022-08-05 DIAGNOSIS — Z955 Presence of coronary angioplasty implant and graft: Secondary | ICD-10-CM | POA: Diagnosis present

## 2022-08-05 LAB — GLUCOSE, CAPILLARY: Glucose-Capillary: 87 mg/dL (ref 70–99)

## 2022-08-05 NOTE — Progress Notes (Signed)
Incomplete Session Note  Patient Details  Name: Carla Knight MRN: [id number] Date of Birth: Apr 25, 1976 Referring Provider:  Dr. Daneen Schick  [name] did not complete her rehab session.  Her blood sugar was 87 mg/dL. Per departmental guidelines, her blood sugar must be 110 mg/dL to complete a walk test. She will return on 08/07/2022 at 1045 to complete her walk test.

## 2022-08-07 ENCOUNTER — Encounter (HOSPITAL_COMMUNITY)
Admission: RE | Admit: 2022-08-07 | Discharge: 2022-08-07 | Disposition: A | Discharge status: Home / Self Care | Payer: Commercial Managed Care - PPO | Source: Ambulatory Visit | Attending: Internal Medicine | Admitting: Internal Medicine

## 2022-08-07 VITALS — BP 124/82 | HR 80 | Ht 63.0 in | Wt 174.2 lb

## 2022-08-07 DIAGNOSIS — Z955 Presence of coronary angioplasty implant and graft: Secondary | ICD-10-CM | POA: Diagnosis not present

## 2022-08-07 LAB — GLUCOSE, CAPILLARY: Glucose-Capillary: 119 mg/dL — ABNORMAL HIGH (ref 70–99)

## 2022-08-07 NOTE — Progress Notes (Signed)
Cardiac Individual Treatment Plan  Patient Details  Name: Carla Knight MRN: [id number] Date of Birth: 12-Apr-1976 Referring Provider:   Flowsheet Row CARDIAC REHAB PHASE II EXERCISE from 08/07/2022 in [place]  Referring Provider Dr. Tamala Julian       Initial Encounter Date:  Flowsheet Row CARDIAC REHAB PHASE II EXERCISE from 08/07/2022 in [place]  Date 08/07/22       Visit Diagnosis: S/P drug eluting coronary stent placement  Patient's Home Medications on Admission:  Current Outpatient Medications:    acetaminophen (TYLENOL) 500 MG tablet, Take 500 mg by mouth every 8 (eight) hours as needed for headache or moderate pain., Disp: , Rfl:    aspirin EC 81 MG tablet, Take 1 tablet (81 mg total) by mouth daily. Swallow whole., Disp: 90 tablet, Rfl: 3   atorvastatin (LIPITOR) 80 MG tablet, Take 1 tablet (80 mg total) by mouth every evening., Disp: 90 tablet, Rfl: 3   glimepiride (AMARYL) 2 MG tablet, Take 2 mg by mouth daily with breakfast., Disp: , Rfl:    icosapent Ethyl (VASCEPA) 1 g capsule, Take 2 capsules (2 g total) by mouth 2 (two) times daily., Disp: 120 capsule, Rfl: 11   JARDIANCE 25 MG TABS tablet, Take 25 mg by mouth daily., Disp: , Rfl:    ketotifen (ZADITOR) 0.025 % ophthalmic solution, Place 1 drop into both eyes daily as needed (allergies)., Disp: , Rfl:    lisinopril (PRINIVIL,ZESTRIL) 10 MG tablet, Take 10 mg by mouth every morning. , Disp: , Rfl:    metFORMIN (GLUCOPHAGE) 1000 MG tablet, Take 1 tablet (1,000 mg total) by mouth 2 (two) times daily., Disp: , Rfl:    metoprolol succinate (TOPROL XL) 25 MG 24 hr tablet, Take 1 tablet (25 mg total) by mouth daily., Disp: 30 tablet, Rfl: 11   nitroGLYCERIN (NITROSTAT) 0.4 MG SL tablet, Place 1 tablet (0.4 mg total) under the tongue every 5 (five) minutes as needed for chest pain., Disp: 25 tablet, Rfl: 0   OZEMPIC, 0.25 OR 0.5 MG/DOSE, 2 MG/3ML SOPN, Inject 0.5 mg into the skin  every Sunday., Disp: , Rfl:    Pediatric Multivit-Minerals-C (CHILDRENS GUMMIES) CHEW, Chew 2 tablets by mouth every evening., Disp: , Rfl:    pioglitazone (ACTOS) 15 MG tablet, Take 15 mg by mouth daily., Disp: , Rfl:    SYNTHROID 150 MCG tablet, Take 1 tablet (150 mcg total) by mouth daily before breakfast., Disp: 90 tablet, Rfl: 3   ticagrelor (BRILINTA) 90 MG TABS tablet, Take 1 tablet (90 mg total) by mouth 2 (two) times daily., Disp: 60 tablet, Rfl: 11  Past Medical History: Past Medical History:  Diagnosis Date   Diabetes mellitus    NIDDM, type 2    Dyslipidemia    Goiter    no current meds.   Hypertension    under control; has been on med. x 5-6 yrs.   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   right   PONV (postoperative nausea and vomiting)    nauseated when ate after surgery   Thyroid mass     Tobacco Use: Social History   Tobacco Use  Smoking Status Former   Packs/day: 0.25   Years: 15.00   Total pack years: 3.75   Types: Cigarettes   Quit date: 07/02/2022   Years since quitting: 0.0  Smokeless Tobacco Never  Tobacco  Comments   "im a casual mosker, every now and then"     Labs: Review Flowsheet       Latest Ref Rng & Units 06/10/2008 11/08/2011 04/29/2018 07/03/2022  Labs for ITP Cardiac and Pulmonary Rehab  Cholestrol 0 - 200 mg/dL - - - 150   LDL (calc) 0 - 99 mg/dL - - - 41   HDL-C >40 mg/dL - - - 31   Trlycerides <150 mg/dL - - - 389   Hemoglobin A1c 4.8 - 5.6 % 5.5 (NOTE)   The ADA recommends the following therapeutic goal for glycemic   control related to Hgb A1C measurement:   Goal of Therapy:   < 7.0% Hgb A1C   Reference: American Diabetes Association: Clinical Practice   Recommendations 2008, Diabetes Care,  2008, 31:(Suppl 1).  - 7.2  -  TCO2 0 - 100 mmol/L - 25  - -    Capillary Blood Glucose: Lab Results  Component Value Date    GLUCAP 87 08/05/2022   GLUCAP 151 (H) 07/03/2022   GLUCAP 141 (H) 07/02/2022   GLUCAP 149 (H) 07/02/2022   GLUCAP 192 (H) 07/02/2022    POCT Glucose     Row Name 08/05/22 1338 08/07/22 1129           POCT Blood Glucose   Pre-Exercise 87 mg/dL --      Pre-Exercise #2 -- 119 mg/dL               Exercise Target Goals: Exercise Program Goal: Individual exercise prescription set using results from initial 6 min walk test and THRR while considering  patient's activity barriers and safety.   Exercise Prescription Goal: Starting with aerobic activity 30 plus minutes a day, 3 days per week for initial exercise prescription. Provide home exercise prescription and guidelines that participant acknowledges understanding prior to discharge.  Activity Barriers & Risk Stratification:  Activity Barriers & Cardiac Risk Stratification - 08/05/22 1256       Activity Barriers & Cardiac Risk Stratification   Activity Barriers Deconditioning    Cardiac Risk Stratification High             6 Minute Walk:  6 Minute Walk     Row Name 08/07/22 1129         6 Minute Walk   Distance 1350 feet     Walk Time 6 minutes     # of Rest Breaks 0     MPH 2.56     METS 3.91     RPE 10     VO2 Peak 13.67     Symptoms No     Resting HR 80 bpm     Resting BP 124/82     Resting Oxygen Saturation  97 %     Exercise Oxygen Saturation  during 6 min walk 97 %     Max Ex. HR 90 bpm     Max Ex. BP 124/80     2 Minute Post BP 118/84              Oxygen Initial Assessment:   Oxygen Re-Evaluation:   Oxygen Discharge (Final Oxygen Re-Evaluation):   Initial Exercise Prescription:  Initial Exercise Prescription - 08/07/22 1100       Date of Initial Exercise RX and Referring Provider   Date 08/07/22    Referring Provider Dr. Tamala Julian    Expected Discharge Date 10/28/22      Treadmill   MPH 2    Grade 0  Minutes 17      NuStep   Level 1    SPM 80    Minutes 22       Prescription Details   Frequency (times per week) 3    Duration Progress to 30 minutes of continuous aerobic without signs/symptoms of physical distress      Intensity   THRR 40-80% of Max Heartrate 70-139    Ratings of Perceived Exertion 11-13    Perceived Dyspnea 0-4      Resistance Training   Training Prescription Yes    Weight 3    Reps 10-15             Perform Capillary Blood Glucose checks as needed.  Exercise Prescription Changes:   Exercise Comments:   Exercise Goals and Review:   Exercise Goals     Row Name 08/07/22 1131             Exercise Goals   Increase Physical Activity Yes       Intervention Provide advice, education, support and counseling about physical activity/exercise needs.;Develop an individualized exercise prescription for aerobic and resistive training based on initial evaluation findings, risk stratification, comorbidities and participant's personal goals.       Expected Outcomes Short Term: Attend rehab on a regular basis to increase amount of physical activity.;Long Term: Add in home exercise to make exercise part of routine and to increase amount of physical activity.;Long Term: Exercising regularly at least 3-5 days a week.       Increase Strength and Stamina Yes       Intervention Provide advice, education, support and counseling about physical activity/exercise needs.;Develop an individualized exercise prescription for aerobic and resistive training based on initial evaluation findings, risk stratification, comorbidities and participant's personal goals.       Expected Outcomes Short Term: Increase workloads from initial exercise prescription for resistance, speed, and METs.;Short Term: Perform resistance training exercises routinely during rehab and add in resistance training at home;Long Term: Improve cardiorespiratory fitness, muscular endurance and strength as measured by increased METs and functional capacity (6MWT)       Able to  understand and use rate of perceived exertion (RPE) scale Yes       Intervention Provide education and explanation on how to use RPE scale       Expected Outcomes Short Term: Able to use RPE daily in rehab to express subjective intensity level;Long Term:  Able to use RPE to guide intensity level when exercising independently       Knowledge and understanding of Target Heart Rate Range (THRR) Yes       Intervention Provide education and explanation of THRR including how the numbers were predicted and where they are located for reference       Expected Outcomes Short Term: Able to state/look up THRR;Long Term: Able to use THRR to govern intensity when exercising independently;Short Term: Able to use daily as guideline for intensity in rehab       Able to check pulse independently Yes       Intervention Provide education and demonstration on how to check pulse in carotid and radial arteries.;Review the importance of being able to check your own pulse for safety during independent exercise       Expected Outcomes Short Term: Able to explain why pulse checking is important during independent exercise;Long Term: Able to check pulse independently and accurately       Understanding of Exercise Prescription Yes  Intervention Provide education, explanation, and written materials on patient's individual exercise prescription       Expected Outcomes Short Term: Able to explain program exercise prescription;Long Term: Able to explain home exercise prescription to exercise independently                Exercise Goals Re-Evaluation :    Discharge Exercise Prescription (Final Exercise Prescription Changes):   Nutrition:  Target Goals: Understanding of nutrition guidelines, daily intake of sodium '1500mg'$ , cholesterol '200mg'$ , calories 30% from fat and 7% or less from saturated fats, daily to have 5 or more servings of fruits and vegetables.  Biometrics:  Pre Biometrics - 08/07/22 1132       Pre  Biometrics   Height '5\' 3"'$  (1.6 m)    Weight 174 lb 2.6 oz (79 kg)    Waist Circumference 40.5 inches    Hip Circumference 41 inches    Waist to Hip Ratio 0.99 %    BMI (Calculated) 30.86    Triceps Skinfold 23 mm    % Body Fat 40.2 %    Grip Strength 22.9 kg    Flexibility 11 in    Single Leg Stand 19.86 seconds              Nutrition Therapy Plan and Nutrition Goals:  Nutrition Therapy & Goals - 08/05/22 1259       Intervention Plan   Intervention Nutrition handout(s) given to patient.    Expected Outcomes Short Term Goal: Understand basic principles of dietary content, such as calories, fat, sodium, cholesterol and nutrients.             Nutrition Assessments:  Nutrition Assessments - 08/05/22 1259       MEDFICTS Scores   Pre Score 64            MEDIFICTS Score Key: ?70 Need to make dietary changes  40-70 Heart Healthy Diet ? 40 Therapeutic Level Cholesterol Diet   Picture Your Plate Scores: <78 Unhealthy dietary pattern with much room for improvement. 41-50 Dietary pattern unlikely to meet recommendations for good health and room for improvement. 51-60 More healthful dietary pattern, with some room for improvement.  >60 Healthy dietary pattern, although there may be some specific behaviors that could be improved.    Nutrition Goals Re-Evaluation:   Nutrition Goals Discharge (Final Nutrition Goals Re-Evaluation):   Psychosocial: Target Goals: Acknowledge presence or absence of significant depression and/or stress, maximize coping skills, provide positive support system. Participant is able to verbalize types and ability to use techniques and skills needed for reducing stress and depression.  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 Yes      Barriers   Psychosocial barriers to participate in program There are no identifiable barriers or psychosocial needs.      Screening Interventions   Interventions Encouraged to exercise    Expected Outcomes Long Term goal: The participant improves quality of Life and PHQ9 Scores as seen by post scores and/or verbalization of changes;Short Term goal: Identification and review with participant of any Quality of Life or Depression concerns found by scoring the questionnaire.  Quality of Life Scores:  Quality of Life - 08/05/22 1347       Quality of Life   Select Quality of Life      Quality of Life Scores   Health/Function Pre 19.2 %    Socioeconomic Pre 19.86 %    Psych/Spiritual Pre 19.42 %    Family Pre 20.1 %    GLOBAL Pre 19.52 %            Scores of 19 and below usually indicate a poorer quality of life in these areas.  A difference of  2-3 points is a clinically meaningful difference.  A difference of 2-3 points in the total score of the Quality of Life Index has been associated with significant improvement in overall quality of life, self-image, physical symptoms, and general health in studies assessing change in quality of life.  PHQ-9: Review Flowsheet       08/05/2022 07/21/2014  Depression screen PHQ 2/9  Decreased Interest 0 0  Down, Depressed, Hopeless 1 0  PHQ - 2 Score 1 0  Altered sleeping 1 -  Tired, decreased energy 2 -  Change in appetite 2 -  Feeling bad or failure about yourself  1 -  Trouble concentrating 0 -  Moving slowly or fidgety/restless 0 -  Suicidal thoughts 0 -  PHQ-9 Score 7 -  Difficult doing work/chores Somewhat difficult -   Interpretation of Total Score  Total Score Depression Severity:  1-4 = Minimal depression, 5-9 = Mild depression, 10-14 = Moderate depression, 15-19 = Moderately severe depression, 20-27 = Severe  depression   Psychosocial Evaluation and Intervention:  Psychosocial Evaluation - 08/05/22 1347       Psychosocial Evaluation & Interventions   Interventions Encouraged to exercise with the program and follow exercise prescription    Comments Pt has no barriers to participating in CR. She does report sleep and stress concerns, but she denies anxiety and depression. She reports having trouble staying asleep and that she frequently wakes up in the night. She does take melatonin or benadryl occassionaly. Her stress concerns are due to work. 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 She scored a 7 on her PHQ-9. She reports that her energy levels have been low since her stent, and this also causes her to feel down. She reports that she has a good support system with her husband. Her goals in the program are to learn more about heart disease and to learn more about nutrition. She is eager to start the program and will be coming during her lunch break from work.    Expected Outcomes Pt's stressors will be reduced and she will have no other identifiable psychosocial issues.    Continue Psychosocial Services  No Follow up required             Psychosocial Re-Evaluation:   Psychosocial Discharge (Final Psychosocial Re-Evaluation):   Vocational Rehabilitation: Provide vocational rehab assistance to qualifying candidates.   Vocational Rehab Evaluation & Intervention:  Vocational Rehab - 08/05/22 1307       Initial Vocational Rehab Evaluation & Intervention   Assessment shows need for Vocational Rehabilitation No      Vocational Rehab Re-Evaulation   Comments Already returned to work             Education: Education Goals: Education classes will be provided on a weekly basis, covering required topics.  Participant will state understanding/return demonstration of topics  presented.  Learning Barriers/Preferences:  Learning Barriers/Preferences - 08/05/22 1305       Learning Barriers/Preferences   Learning Barriers None    Learning Preferences Audio;Computer/Internet;Group Instruction;Individual Instruction;Pictoral;Skilled Demonstration;Verbal Instruction;Video;Written Material             Education Topics: Hypertension, Hypertension Reduction -Define heart disease and high blood pressure. Discus how high blood pressure affects the body and ways to reduce high blood pressure.   Exercise and Your Heart -Discuss why it is important to exercise, the FITT principles of exercise, normal and abnormal responses to exercise, and how to exercise safely.   Angina -Discuss definition of angina, causes of angina, treatment of angina, and how to decrease risk of having angina.   Cardiac Medications -Review what the following cardiac medications are used for, how they affect the body, and side effects that may occur when taking the medications.  Medications include Aspirin, Beta blockers, calcium channel blockers, ACE Inhibitors, angiotensin receptor blockers, diuretics, digoxin, and antihyperlipidemics.   Congestive Heart Failure -Discuss the definition of CHF, how to live with CHF, the signs and symptoms of CHF, and how keep track of weight and sodium intake.   Heart Disease and Intimacy -Discus the effect sexual activity has on the heart, how changes occur during intimacy as we age, and safety during sexual activity.   Smoking Cessation / COPD -Discuss different methods to quit smoking, the health benefits of quitting smoking, and the definition of COPD.   Nutrition I: Fats -Discuss the types of cholesterol, what cholesterol does to the heart, and how cholesterol levels can be controlled.   Nutrition II: Labels -Discuss the different components of food labels and how to read food label   Heart Parts/Heart Disease and PAD -Discuss the  anatomy of the heart, the pathway of blood circulation through the heart, and these are affected by heart disease.   Stress I: Signs and Symptoms -Discuss the causes of stress, how stress may lead to anxiety and depression, and ways to limit stress. Flowsheet Row CARDIAC REHAB PHASE II EXERCISE from 08/07/2022 in [place]  Date 08/07/22  Educator DF  Instruction Review Code 1- Verbalizes Understanding       Stress II: Relaxation -Discuss different types of relaxation techniques to limit stress.   Warning Signs of Stroke / TIA -Discuss definition of a stroke, what the signs and symptoms are of a stroke, and how to identify when someone is having stroke.   Knowledge Questionnaire Score:  Knowledge Questionnaire Score - 08/05/22 1305       Knowledge Questionnaire Score   Pre Score 23/24             Core Components/Risk Factors/Patient Goals at Admission:  Personal Goals and Risk Factors at Admission - 08/05/22 1308       Core Components/Risk Factors/Patient Goals on Admission   Diabetes Yes    Intervention Provide education about signs/symptoms and action to take for hypo/hyperglycemia.;Provide education about proper nutrition, including hydration, and aerobic/resistive exercise prescription along with prescribed medications to achieve blood glucose in normal ranges: Fasting glucose 65-99 mg/dL    Expected Outcomes Short Term: Participant verbalizes understanding of the signs/symptoms and immediate care of hyper/hypoglycemia, proper foot care and importance of medication, aerobic/resistive exercise and nutrition plan for blood glucose control.;Long Term: Attainment of HbA1C < 7%.    Hypertension Yes    Intervention Provide education on lifestyle modifcations including regular physical activity/exercise, weight management, moderate  sodium restriction and increased consumption of fresh fruit, vegetables, and low fat dairy, alcohol moderation, and smoking  cessation.;Monitor prescription use compliance.    Expected Outcomes Short Term: Continued assessment and intervention until BP is < 140/71m HG in hypertensive participants. < 130/86mHG in hypertensive participants with diabetes, heart failure or chronic kidney disease.;Long Term: Maintenance of blood pressure at goal levels.    Lipids Yes    Intervention Provide education and support for participant on nutrition & aerobic/resistive exercise along with prescribed medications to achieve LDL '70mg'$ , HDL >'40mg'$ .    Expected Outcomes Short Term: Participant states understanding of desired cholesterol values and is compliant with medications prescribed. Participant is following exercise prescription and nutrition guidelines.;Long Term: Cholesterol controlled with medications as prescribed, with individualized exercise RX and with personalized nutrition plan. Value goals: LDL < '70mg'$ , HDL > 40 mg.    Personal Goal Other Yes    Personal Goal Learn about diet and heart disease.    Intervention Attend cardiac rehab education sessions.    Expected Outcomes Pt will meet stated goals.             Core Components/Risk Factors/Patient Goals Review:    Core Components/Risk Factors/Patient Goals at Discharge (Final Review):    ITP Comments:   Comments: Patient arrived for 1st visit/orientation/education at 1049she had previously completed her orientation but was not able to complete her walk test due to hypoglycemia. Patient was referred to CR by Dr. SmTamala Julianue to s/p drug eluting coronary stent placement. During orientation advised patient on arrival and appointment times what to wear, what to do before, during and after exercise. Reviewed attendance and class policy.  Pt is scheduled to return Cardiac Rehab on 08/09/2022 at 1100. Pt was advised to come to class 15 minutes before class starts.  Discussed RPE/Dpysnea scales. Patient participated in warm up stretches. Patient was able to complete 6 minute  walk test.  Telemetry: NSR. Patient was measured for the equipment. Discussed equipment safety with patient. Took patient pre-anthropometric measurements. Patient finished visit at 1130.

## 2022-08-07 NOTE — Progress Notes (Signed)
Daily Session Note  Patient Details  Name: Carla Knight MRN: [id number] Date of Birth: 1976/02/20 Referring Provider:    Encounter Date: 08/07/2022  Check In:  Session Check In - 08/07/22 1048       Check-In   Supervising physician immediately available to respond to emergencies [facility] MD immediately available    Physician(s) Dr Marlou Porch    Location [facility]    Staff Present Kaveri Perras Hassell Done, RN, Bjorn Loser, MS, ACSM-CEP, Exercise Physiologist    Virtual Visit No    Medication changes reported     No    Fall or balance concerns reported    No    Tobacco Cessation No Change    Warm-up and Cool-down Performed as group-led instruction   walk test   Resistance Training Performed Yes    VAD Patient? No    PAD/SET Patient? No      Pain Assessment   Currently in Pain? No/denies    Multiple Pain Sites No             Capillary Blood Glucose: No results found for this or any previous visit (from the past 24 hour(s)).    Social History   Tobacco Use  Smoking Status Former   Packs/day: 0.25   Years: 15.00   Total pack years: 3.75   Types: Cigarettes   Quit date: 07/02/2022   Years since quitting: 0.0  Smokeless Tobacco Never  Tobacco Comments   "im a casual mosker, every now and then"     Goals Met:  Independence with exercise equipment Exercise tolerated well No report of concerns or symptoms today Strength training completed today  Goals Unmet:  Not Applicable  Comments: Checkout at 1200.   Dr. Carlyle Dolly is Medical Director for [facility] Cardiac Rehab

## 2022-08-09 ENCOUNTER — Encounter (HOSPITAL_COMMUNITY)
Admission: RE | Admit: 2022-08-09 | Discharge: 2022-08-09 | Disposition: A | Discharge status: Home / Self Care | Payer: Commercial Managed Care - PPO | Source: Ambulatory Visit | Attending: Internal Medicine | Admitting: Internal Medicine

## 2022-08-09 DIAGNOSIS — Z955 Presence of coronary angioplasty implant and graft: Secondary | ICD-10-CM

## 2022-08-09 NOTE — Progress Notes (Signed)
Daily Session Note  Patient Details  Name: Carla Knight MRN: [id number] Date of Birth: 1976-05-31 Referring Provider:   Flowsheet Row CARDIAC REHAB PHASE II EXERCISE from 08/07/2022 in [place]  Referring Provider Dr. Tamala Julian       Encounter Date: 08/09/2022  Check In:  Session Check In - 08/09/22 1100       Check-In   Supervising physician immediately available to respond to emergencies [facility] MD immediately available    Physician(s) Dr Harrington Challenger    Location [facility]    Staff Present Aundra Dubin, RN, Joanette Gula, RN, BSN    Virtual Visit No    Medication changes reported     No    Fall or balance concerns reported    No    Tobacco Cessation No Change    Warm-up and Cool-down Performed as group-led instruction    Resistance Training Performed Yes    VAD Patient? No    PAD/SET Patient? No      Pain Assessment   Currently in Pain? No/denies    Multiple Pain Sites No             Capillary Blood Glucose: No results found for this or any previous visit (from the past 24 hour(s)).    Social History   Tobacco Use  Smoking Status Former   Packs/day: 0.25   Years: 15.00   Total pack years: 3.75   Types: Cigarettes   Quit date: 07/02/2022   Years since quitting: 0.1  Smokeless Tobacco Never  Tobacco Comments   "im a casual mosker, every now and then"     Goals Met:  Independence with exercise equipment Exercise tolerated well No report of concerns or symptoms today Strength training completed today  Goals Unmet:  Not Applicable  Comments: Checkout at 1200.   Dr. Carlyle Dolly is Medical Director for [facility]

## 2022-08-12 ENCOUNTER — Encounter (HOSPITAL_COMMUNITY)
Admission: RE | Admit: 2022-08-12 | Discharge: 2022-08-12 | Disposition: A | Discharge status: Home / Self Care | Payer: Commercial Managed Care - PPO | Source: Ambulatory Visit | Attending: Internal Medicine | Admitting: Internal Medicine

## 2022-08-12 DIAGNOSIS — Z955 Presence of coronary angioplasty implant and graft: Secondary | ICD-10-CM

## 2022-08-12 NOTE — Progress Notes (Signed)
Daily Session Note  Patient Details  Name: SAGE HAMMILL MRN: [id number] Date of Birth: 31-Mar-1976 Referring Provider:   Flowsheet Row CARDIAC REHAB PHASE II EXERCISE from 08/07/2022 in [place]  Referring Provider Dr. Tamala Julian       Encounter Date: 08/12/2022  Check In:  Session Check In - 08/12/22 1053       Check-In   Supervising physician immediately available to respond to emergencies [facility] immediately available    Physician(s) Dr Domenic Polite    Location [facility]    Staff Present Geanie Cooley, RN;Heather Otho Ket, BS, Exercise Physiologist;Marthella Osorno Hassell Done, RN, BSN    Virtual Visit No    Medication changes reported     No    Fall or balance concerns reported    No    Tobacco Cessation No Change    Warm-up and Cool-down Performed as group-led instruction    Resistance Training Performed Yes    VAD Patient? No    PAD/SET Patient? No      Pain Assessment   Currently in Pain? No/denies    Multiple Pain Sites No             Capillary Blood Glucose: No results found for this or any previous visit (from the past 24 hour(s)).    Social History   Tobacco Use  Smoking Status Former   Packs/day: 0.25   Years: 15.00   Total pack years: 3.75   Types: Cigarettes   Quit date: 07/02/2022   Years since quitting: 0.1  Smokeless Tobacco Never  Tobacco Comments   "im a casual mosker, every now and then"     Goals Met:  Independence with exercise equipment Exercise tolerated well No report of concerns or symptoms today Strength training completed today  Goals Unmet:  Not Applicable  Comments: checkout at 1200.   Dr. Carlyle Dolly is Medical Director for [facility]

## 2022-08-14 ENCOUNTER — Encounter (HOSPITAL_COMMUNITY)
Admission: RE | Admit: 2022-08-14 | Discharge: 2022-08-14 | Disposition: A | Discharge status: Home / Self Care | Payer: Commercial Managed Care - PPO | Source: Ambulatory Visit | Attending: Internal Medicine | Admitting: Internal Medicine

## 2022-08-14 DIAGNOSIS — Z955 Presence of coronary angioplasty implant and graft: Secondary | ICD-10-CM

## 2022-08-14 NOTE — Progress Notes (Signed)
Daily Session Note  Patient Details  Name: Carla Knight MRN: [id number] Date of Birth: 1976-08-23 Referring Provider:   Flowsheet Row CARDIAC REHAB PHASE II EXERCISE from 08/07/2022 in [place]  Referring Provider Dr. Tamala Julian       Encounter Date: 08/14/2022  Check In:  Session Check In - 08/14/22 1100       Check-In   Supervising physician immediately available to respond to emergencies [facility] immediately available    Physician(s) Dr. Marlou Porch    Location [facility]    Staff Present Redge Gainer, BS, Exercise Physiologist;Dalton Kris Mouton, MS, ACSM-CEP, Exercise Physiologist    Virtual Visit No    Medication changes reported     No    Fall or balance concerns reported    No    Tobacco Cessation No Change    Warm-up and Cool-down Performed as group-led instruction    Resistance Training Performed Yes    VAD Patient? No    PAD/SET Patient? No      Pain Assessment   Currently in Pain? No/denies    Multiple Pain Sites No             Capillary Blood Glucose: No results found for this or any previous visit (from the past 24 hour(s)).    Social History   Tobacco Use  Smoking Status Former   Packs/day: 0.25   Years: 15.00   Total pack years: 3.75   Types: Cigarettes   Quit date: 07/02/2022   Years since quitting: 0.1  Smokeless Tobacco Never  Tobacco Comments   "im a casual mosker, every now and then"     Goals Met:  Independence with exercise equipment Exercise tolerated well No report of concerns or symptoms today Strength training completed today  Goals Unmet:  Not Applicable  Comments: check out 1200   Dr. Carlyle Dolly is Medical Director for [facility]

## 2022-08-16 ENCOUNTER — Encounter (HOSPITAL_COMMUNITY)
Admission: RE | Admit: 2022-08-16 | Discharge: 2022-08-16 | Disposition: A | Discharge status: Home / Self Care | Payer: Commercial Managed Care - PPO | Source: Ambulatory Visit | Attending: Internal Medicine | Admitting: Internal Medicine

## 2022-08-16 DIAGNOSIS — Z955 Presence of coronary angioplasty implant and graft: Secondary | ICD-10-CM | POA: Diagnosis not present

## 2022-08-16 NOTE — Progress Notes (Signed)
Daily Session Note  Patient Details  Name: Carla Knight MRN: [id number] Date of Birth: 1976/05/03 Referring Provider:   Flowsheet Row CARDIAC REHAB PHASE II EXERCISE from 08/07/2022 in [place]  Referring Provider Dr. Tamala Julian       Encounter Date: 08/16/2022  Check In:  Session Check In - 08/16/22 1056       Check-In   Supervising physician immediately available to respond to emergencies [facility] immediately available    Physician(s) Dr. Gasper Sells    Location [facility]    Staff Present Hoy Register, MS, ACSM-CEP, Exercise Physiologist;Heather Zigmund Daniel, Exercise Physiologist    Virtual Visit No    Medication changes reported     No    Fall or balance concerns reported    No    Tobacco Cessation No Change    Warm-up and Cool-down Performed as group-led instruction    Resistance Training Performed Yes    VAD Patient? No    PAD/SET Patient? No      Pain Assessment   Currently in Pain? No/denies    Multiple Pain Sites No             Capillary Blood Glucose: No results found for this or any previous visit (from the past 24 hour(s)).    Social History   Tobacco Use  Smoking Status Former   Packs/day: 0.25   Years: 15.00   Total pack years: 3.75   Types: Cigarettes   Quit date: 07/02/2022   Years since quitting: 0.1  Smokeless Tobacco Never  Tobacco Comments   "im a casual mosker, every now and then"     Goals Met:  Independence with exercise equipment Exercise tolerated well No report of concerns or symptoms today Strength training completed today  Goals Unmet:  Not Applicable  Comments: checkout time is 1200   Dr. Carlyle Dolly is Medical Director for [place]

## 2022-08-19 ENCOUNTER — Encounter (HOSPITAL_COMMUNITY)
Admission: RE | Admit: 2022-08-19 | Discharge: 2022-08-19 | Disposition: A | Discharge status: Home / Self Care | Payer: Commercial Managed Care - PPO | Source: Ambulatory Visit | Attending: Internal Medicine | Admitting: Internal Medicine

## 2022-08-19 ENCOUNTER — Telehealth: Payer: Self-pay | Admitting: Internal Medicine

## 2022-08-19 DIAGNOSIS — Z955 Presence of coronary angioplasty implant and graft: Secondary | ICD-10-CM | POA: Diagnosis present

## 2022-08-19 NOTE — Progress Notes (Signed)
Daily Session Note  Patient Details  Name: Carla Knight MRN: [id number] Date of Birth: [birth year] Referring Provider:   Flowsheet Row CARDIAC REHAB PHASE II EXERCISE from 08/07/2022 in [place]  Referring Provider Dr. Tamala Julian       Encounter Date: 08/19/2022  Check In:  Session Check In - 08/19/22 1100       Check-In   Supervising physician immediately available to respond to emergencies [facility] immediately available    Physician(s) Dr. Phineas Inches    Location [facility]    Staff Present Hoy Register, MS, ACSM-CEP, Exercise Physiologist;Myka Lukins Zigmund Daniel, Exercise Physiologist    Virtual Visit No    Medication changes reported     No    Fall or balance concerns reported    No    Tobacco Cessation No Change    Warm-up and Cool-down Performed as group-led instruction    Resistance Training Performed Yes    VAD Patient? No    PAD/SET Patient? No      Pain Assessment   Currently in Pain? No/denies    Multiple Pain Sites No             Capillary Blood Glucose: No results found for this or any previous visit (from the past 24 hour(s)).    Social History   Tobacco Use  Smoking Status Former   Packs/day: 0.25   Years: 15.00   Total pack years: 3.75   Types: Cigarettes   Quit date: 07/02/2022   Years since quitting: 0.1  Smokeless Tobacco Never  Tobacco Comments   "im a casual mosker, every now and then"     Goals Met:  Independence with exercise equipment Exercise tolerated well No report of concerns or symptoms today Strength training completed today  Goals Unmet:  Not Applicable  Comments: check out 1200   Dr. Carlyle Dolly is Medical Director for [place]

## 2022-08-19 NOTE — Telephone Encounter (Signed)
Pt is calling in regards to FMLA paper work she sent in and requesting call back for update. She states she needs this for work since she is having to go to cardiac rehab 3 x weekly.

## 2022-08-19 NOTE — Telephone Encounter (Signed)
Attempted to call patient, left message for patient to call back to office.   Will forward to Dr. Harl Bowie to see if okay to fill out FMLA forms while patient is in cardiac Rehab.

## 2022-08-21 ENCOUNTER — Encounter (HOSPITAL_COMMUNITY)
Admission: RE | Admit: 2022-08-21 | Discharge: 2022-08-21 | Disposition: A | Discharge status: Home / Self Care | Payer: Commercial Managed Care - PPO | Source: Ambulatory Visit | Attending: Internal Medicine | Admitting: Internal Medicine

## 2022-08-21 DIAGNOSIS — Z955 Presence of coronary angioplasty implant and graft: Secondary | ICD-10-CM | POA: Diagnosis not present

## 2022-08-21 NOTE — Progress Notes (Signed)
Daily Session Note  Patient Details  Name: Carla Knight MRN: [id number] Date of Birth: Apr 29, 1976 Referring Provider:   Flowsheet Row CARDIAC REHAB PHASE II EXERCISE from 08/07/2022 in [place]  Referring Provider Dr. Tamala Julian       Encounter Date: 08/21/2022  Check In:  Session Check In - 08/21/22 1108       Check-In   Supervising physician immediately available to respond to emergencies [facility] MD immediately available    Physician(s) Dr Harl Bowie    Location [facility]    Staff Present [place] Sloop Hassell Done, RN, BSN;Heather Otho Ket, BS, Exercise Physiologist    Virtual Visit No    Medication changes reported     No    Fall or balance concerns reported    No    Tobacco Cessation No Change    Warm-up and Cool-down Performed as group-led instruction    Resistance Training Performed Yes    VAD Patient? No    PAD/SET Patient? No      Pain Assessment   Currently in Pain? No/denies    Multiple Pain Sites No             Capillary Blood Glucose: No results found for this or any previous visit (from the past 24 hour(s)).    Social History   Tobacco Use  Smoking Status Former   Packs/day: 0.25   Years: 15.00   Total pack years: 3.75   Types: Cigarettes   Quit date: 07/02/2022   Years since quitting: 0.1  Smokeless Tobacco Never  Tobacco Comments   "im a casual mosker, every now and then"     Goals Met:  Independence with exercise equipment Exercise tolerated well No report of concerns or symptoms today Strength training completed today  Goals Unmet:  Not Applicable  Comments: checkout at 1200.   Dr. Carlyle Dolly is Medical Director for [facility]

## 2022-08-23 ENCOUNTER — Encounter (HOSPITAL_COMMUNITY)
Admission: RE | Admit: 2022-08-23 | Discharge: 2022-08-23 | Disposition: A | Discharge status: Home / Self Care | Payer: Commercial Managed Care - PPO | Source: Ambulatory Visit | Attending: Internal Medicine | Admitting: Internal Medicine

## 2022-08-23 DIAGNOSIS — Z955 Presence of coronary angioplasty implant and graft: Secondary | ICD-10-CM | POA: Diagnosis not present

## 2022-08-23 NOTE — Progress Notes (Signed)
Daily Session Note  Patient Details  Name: Carla Knight MRN: [id number] Date of Birth: 01-16-1976 Referring Provider:   Flowsheet Row CARDIAC REHAB PHASE II EXERCISE from 08/07/2022 in [place]  Referring Provider Dr. Tamala Julian       Encounter Date: 08/23/2022  Check In:  Session Check In - 08/23/22 1100       Check-In   Supervising physician immediately available to respond to emergencies [facility] MD immediately available    Physician(s) Dr. Harrington Challenger    Location [facility]    Staff Present Redge Gainer, BS, Exercise Physiologist;Dalton Kris Mouton, MS, ACSM-CEP, Exercise Physiologist    Virtual Visit No    Medication changes reported     No    Fall or balance concerns reported    No    Tobacco Cessation No Change    Warm-up and Cool-down Performed as group-led instruction    Resistance Training Performed Yes    VAD Patient? No    PAD/SET Patient? No      Pain Assessment   Currently in Pain? No/denies    Multiple Pain Sites No             Capillary Blood Glucose: No results found for this or any previous visit (from the past 24 hour(s)).    Social History   Tobacco Use  Smoking Status Former   Packs/day: 0.25   Years: 15.00   Total pack years: 3.75   Types: Cigarettes   Quit date: 07/02/2022   Years since quitting: 0.1  Smokeless Tobacco Never  Tobacco Comments   "im a casual mosker, every now and then"     Goals Met:  Independence with exercise equipment Exercise tolerated well No report of concerns or symptoms today Strength training completed today  Goals Unmet:  Not Applicable  Comments: check out 1200   Dr. Carlyle Dolly is Medical Director for [facility]

## 2022-08-26 ENCOUNTER — Encounter (HOSPITAL_COMMUNITY)
Admission: RE | Admit: 2022-08-26 | Discharge: 2022-08-26 | Disposition: A | Discharge status: Home / Self Care | Payer: Commercial Managed Care - PPO | Source: Ambulatory Visit | Attending: Internal Medicine | Admitting: Internal Medicine

## 2022-08-26 VITALS — Wt 171.1 lb

## 2022-08-26 DIAGNOSIS — Z955 Presence of coronary angioplasty implant and graft: Secondary | ICD-10-CM | POA: Diagnosis not present

## 2022-08-26 NOTE — Progress Notes (Signed)
Daily Session Note  Patient Details  Name: Carla Knight MRN: [id number] Date of Birth: 1976/07/01 Referring Provider:   Flowsheet Row CARDIAC REHAB PHASE II EXERCISE from 08/07/2022 in [place]  Referring Provider Dr. Tamala Julian       Encounter Date: 08/26/2022  Check In:  Session Check In - 08/26/22 1100       Check-In   Supervising physician immediately available to respond to emergencies [facility] MD immediately available    Physician(s) Dr. Radford Pax    Location [facility]    Staff Present Hoy Register, MS, ACSM-CEP, Exercise Physiologist;Debra Wynetta Emery, RN, BSN    Virtual Visit No    Medication changes reported     No    Fall or balance concerns reported    No    Tobacco Cessation No Change    Warm-up and Cool-down Performed as group-led instruction    Resistance Training Performed Yes    VAD Patient? No    PAD/SET Patient? No      Pain Assessment   Currently in Pain? No/denies    Multiple Pain Sites No             Capillary Blood Glucose: No results found for this or any previous visit (from the past 24 hour(s)).    Social History   Tobacco Use  Smoking Status Former   Packs/day: 0.25   Years: 15.00   Total pack years: 3.75   Types: Cigarettes   Quit date: 07/02/2022   Years since quitting: 0.1  Smokeless Tobacco Never  Tobacco Comments   "im a casual mosker, every now and then"     Goals Met:  Independence with exercise equipment Exercise tolerated well No report of concerns or symptoms today Strength training completed today  Goals Unmet:  Not Applicable  Comments: checkout time is 1200   Dr. Carlyle Dolly is Medical Director for [place]

## 2022-08-26 NOTE — Progress Notes (Signed)
I have reviewed a Home Exercise Prescription with Carla Knight . Carla Knight is  currently exercising at home.  The patient was advised to walk 2-4 days a week for 30-45 minutes.  Carla Knight and I discussed how to progress their exercise prescription.  The patient stated that their goals were live longer and be healthier.  The patient stated that they understand the exercise prescription.  We reviewed exercise guidelines, target heart rate during exercise, RPE Scale, weather conditions, NTG use, endpoints for exercise, warmup and cool down.  Patient is encouraged to come to me with any questions. I will continue to follow up with the patient to assist them with progression and safety.

## 2022-08-28 ENCOUNTER — Encounter (HOSPITAL_COMMUNITY)
Admission: RE | Admit: 2022-08-28 | Discharge: 2022-08-28 | Disposition: A | Discharge status: Home / Self Care | Payer: Commercial Managed Care - PPO | Source: Ambulatory Visit | Attending: Internal Medicine | Admitting: Internal Medicine

## 2022-08-28 DIAGNOSIS — Z955 Presence of coronary angioplasty implant and graft: Secondary | ICD-10-CM | POA: Diagnosis not present

## 2022-08-28 NOTE — Progress Notes (Signed)
Cardiac Individual Treatment Plan  Patient Details  Name: Carla Knight MRN: [id number] Date of Birth: 1976-05-03 Referring Provider:   Flowsheet Row CARDIAC REHAB PHASE II EXERCISE from 08/07/2022 in [place]  Referring Provider Dr. Tamala Julian       Initial Encounter Date:  Flowsheet Row CARDIAC REHAB PHASE II EXERCISE from 08/07/2022 in [place]  Date 08/07/22       Visit Diagnosis: S/P drug eluting coronary stent placement  Patient's Home Medications on Admission:  Current Outpatient Medications:    acetaminophen (TYLENOL) 500 MG tablet, Take 500 mg by mouth every 8 (eight) hours as needed for headache or moderate pain., Disp: , Rfl:    aspirin EC 81 MG tablet, Take 1 tablet (81 mg total) by mouth daily. Swallow whole., Disp: 90 tablet, Rfl: 3   atorvastatin (LIPITOR) 80 MG tablet, Take 1 tablet (80 mg total) by mouth every evening., Disp: 90 tablet, Rfl: 3   glimepiride (AMARYL) 2 MG tablet, Take 2 mg by mouth daily with breakfast., Disp: , Rfl:    icosapent Ethyl (VASCEPA) 1 g capsule, Take 2 capsules (2 g total) by mouth 2 (two) times daily., Disp: 120 capsule, Rfl: 11   JARDIANCE 25 MG TABS tablet, Take 25 mg by mouth daily., Disp: , Rfl:    ketotifen (ZADITOR) 0.025 % ophthalmic solution, Place 1 drop into both eyes daily as needed (allergies)., Disp: , Rfl:    lisinopril (PRINIVIL,ZESTRIL) 10 MG tablet, Take 10 mg by mouth every morning. , Disp: , Rfl:    metFORMIN (GLUCOPHAGE) 1000 MG tablet, Take 1 tablet (1,000 mg total) by mouth 2 (two) times daily., Disp: , Rfl:    metoprolol succinate (TOPROL XL) 25 MG 24 hr tablet, Take 1 tablet (25 mg total) by mouth daily., Disp: 30 tablet, Rfl: 11   nitroGLYCERIN (NITROSTAT) 0.4 MG SL tablet, Place 1 tablet (0.4 mg total) under the tongue every 5 (five) minutes as needed for chest pain., Disp: 25 tablet, Rfl: 0   OZEMPIC, 0.25 OR 0.5 MG/DOSE, 2 MG/3ML SOPN, Inject 0.5 mg into the skin  every Sunday., Disp: , Rfl:    Pediatric Multivit-Minerals-C (CHILDRENS GUMMIES) CHEW, Chew 2 tablets by mouth every evening., Disp: , Rfl:    pioglitazone (ACTOS) 15 MG tablet, Take 15 mg by mouth daily., Disp: , Rfl:    SYNTHROID 150 MCG tablet, Take 1 tablet (150 mcg total) by mouth daily before breakfast., Disp: 90 tablet, Rfl: 3   ticagrelor (BRILINTA) 90 MG TABS tablet, Take 1 tablet (90 mg total) by mouth 2 (two) times daily., Disp: 60 tablet, Rfl: 11  Past Medical History: Past Medical History:  Diagnosis Date   Diabetes mellitus    NIDDM, type 2    Dyslipidemia    Goiter    no current meds.   Hypertension    under control; has been on med. x 5-6 yrs.   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   right   PONV (postoperative nausea and vomiting)    nauseated when ate after surgery   Thyroid mass     Tobacco Use: Social History   Tobacco Use  Smoking Status Former   Packs/day: 0.25   Years: 15.00   Total pack years: 3.75   Types: Cigarettes   Quit date: 07/02/2022   Years since quitting: 0.1  Smokeless Tobacco Never  Tobacco  Comments   "im a casual mosker, every now and then"     Labs: Review Flowsheet       Latest Ref Rng & Units 06/10/2008 11/08/2011 04/29/2018 07/03/2022  Labs for ITP Cardiac and Pulmonary Rehab  Cholestrol 0 - 200 mg/dL - - - 150   LDL (calc) 0 - 99 mg/dL - - - 41   HDL-C >40 mg/dL - - - 31   Trlycerides <150 mg/dL - - - 389   Hemoglobin A1c 4.8 - 5.6 % 5.5 (NOTE)   The ADA recommends the following therapeutic goal for glycemic   control related to Hgb A1C measurement:   Goal of Therapy:   < 7.0% Hgb A1C   Reference: American Diabetes Association: Clinical Practice   Recommendations 2008, Diabetes Care,  2008, 31:(Suppl 1).  - 7.2  -  TCO2 0 - 100 mmol/L - 25  - -    Capillary Blood Glucose: Lab Results  Component Value Date    GLUCAP 119 (H) 08/07/2022   GLUCAP 87 08/05/2022   GLUCAP 151 (H) 07/03/2022   GLUCAP 141 (H) 07/02/2022   GLUCAP 149 (H) 07/02/2022    POCT Glucose     Row Name 08/05/22 1338 08/07/22 1129           POCT Blood Glucose   Pre-Exercise 87 mg/dL --      Pre-Exercise #2 -- 119 mg/dL               Exercise Target Goals: Exercise Program Goal: Individual exercise prescription set using results from initial 6 min walk test and THRR while considering  patient's activity barriers and safety.   Exercise Prescription Goal: Starting with aerobic activity 30 plus minutes a day, 3 days per week for initial exercise prescription. Provide home exercise prescription and guidelines that participant acknowledges understanding prior to discharge.  Activity Barriers & Risk Stratification:  Activity Barriers & Cardiac Risk Stratification - 08/05/22 1256       Activity Barriers & Cardiac Risk Stratification   Activity Barriers Deconditioning    Cardiac Risk Stratification High             6 Minute Walk:  6 Minute Walk     Row Name 08/07/22 1129         6 Minute Walk   Distance 1350 feet     Walk Time 6 minutes     # of Rest Breaks 0     MPH 2.56     METS 3.91     RPE 10     VO2 Peak 13.67     Symptoms No     Resting HR 80 bpm     Resting BP 124/82     Resting Oxygen Saturation  97 %     Exercise Oxygen Saturation  during 6 min walk 97 %     Max Ex. HR 90 bpm     Max Ex. BP 124/80     2 Minute Post BP 118/84              Oxygen Initial Assessment:   Oxygen Re-Evaluation:   Oxygen Discharge (Final Oxygen Re-Evaluation):   Initial Exercise Prescription:  Initial Exercise Prescription - 08/07/22 1100       Date of Initial Exercise RX and Referring Provider   Date 08/07/22    Referring Provider Dr. Tamala Julian    Expected Discharge Date 10/28/22      Treadmill   MPH 2    Grade 0  Minutes 17      NuStep   Level 1    SPM 80    Minutes 22       Prescription Details   Frequency (times per week) 3    Duration Progress to 30 minutes of continuous aerobic without signs/symptoms of physical distress      Intensity   THRR 40-80% of Max Heartrate 70-139    Ratings of Perceived Exertion 11-13    Perceived Dyspnea 0-4      Resistance Training   Training Prescription Yes    Weight 3    Reps 10-15             Perform Capillary Blood Glucose checks as needed.  Exercise Prescription Changes:   Exercise Prescription Changes     Row Name 08/14/22 1200 08/26/22 1100 08/26/22 1200         Response to Exercise   Blood Pressure (Admit) 136/74 -- 128/74     Blood Pressure (Exercise) 142/74 -- 134/68     Blood Pressure (Exit) 120/70 -- 116/72     Heart Rate (Admit) 95 bpm -- 83 bpm     Heart Rate (Exercise) 119 bpm -- 100 bpm     Heart Rate (Exit) 95 bpm -- 91 bpm     Rating of Perceived Exertion (Exercise) 12 -- 12     Duration Continue with 30 min of aerobic exercise without signs/symptoms of physical distress. -- Continue with 30 min of aerobic exercise without signs/symptoms of physical distress.     Intensity THRR unchanged -- THRR unchanged       Progression   Progression Continue to progress workloads to maintain intensity without signs/symptoms of physical distress. -- Continue to progress workloads to maintain intensity without signs/symptoms of physical distress.       Resistance Training   Training Prescription Yes -- Yes     Weight 3 -- 3     Reps 10-15 -- 10-15     Time 10 Minutes -- 10 Minutes       Treadmill   MPH 2 -- 2.8     Grade 0 -- 2     Minutes 17 -- 17     METs 2.53 -- 3.91       NuStep   Level 2 -- 2     SPM 96 -- 103     Minutes 22 -- 22     METs 2.13 -- 2.37       Home Exercise Plan   Plans to continue exercise at -- Home (comment) --     Frequency -- Add 2 additional days to program exercise sessions. --     Initial Home Exercises Provided -- 08/26/22 --              Exercise  Comments:   Exercise Comments     Row Name 08/26/22 1126           Exercise Comments home exercise reviewed                Exercise Goals and Review:   Exercise Goals     Row Name 08/07/22 1131 08/26/22 1215           Exercise Goals   Increase Physical Activity Yes Yes      Intervention Provide advice, education, support and counseling about physical activity/exercise needs.;Develop an individualized exercise prescription for aerobic and resistive training based on initial evaluation findings, risk stratification, comorbidities and participant's personal goals. Provide advice,  education, support and counseling about physical activity/exercise needs.;Develop an individualized exercise prescription for aerobic and resistive training based on initial evaluation findings, risk stratification, comorbidities and participant's personal goals.      Expected Outcomes Short Term: Attend rehab on a regular basis to increase amount of physical activity.;Long Term: Add in home exercise to make exercise part of routine and to increase amount of physical activity.;Long Term: Exercising regularly at least 3-5 days a week. Short Term: Attend rehab on a regular basis to increase amount of physical activity.;Long Term: Add in home exercise to make exercise part of routine and to increase amount of physical activity.;Long Term: Exercising regularly at least 3-5 days a week.      Increase Strength and Stamina Yes Yes      Intervention Provide advice, education, support and counseling about physical activity/exercise needs.;Develop an individualized exercise prescription for aerobic and resistive training based on initial evaluation findings, risk stratification, comorbidities and participant's personal goals. Provide advice, education, support and counseling about physical activity/exercise needs.;Develop an individualized exercise prescription for aerobic and resistive training based on initial evaluation  findings, risk stratification, comorbidities and participant's personal goals.      Expected Outcomes Short Term: Increase workloads from initial exercise prescription for resistance, speed, and METs.;Short Term: Perform resistance training exercises routinely during rehab and add in resistance training at home;Long Term: Improve cardiorespiratory fitness, muscular endurance and strength as measured by increased METs and functional capacity (6MWT) Short Term: Increase workloads from initial exercise prescription for resistance, speed, and METs.;Short Term: Perform resistance training exercises routinely during rehab and add in resistance training at home;Long Term: Improve cardiorespiratory fitness, muscular endurance and strength as measured by increased METs and functional capacity (6MWT)      Able to understand and use rate of perceived exertion (RPE) scale Yes Yes      Intervention Provide education and explanation on how to use RPE scale Provide education and explanation on how to use RPE scale      Expected Outcomes Short Term: Able to use RPE daily in rehab to express subjective intensity level;Long Term:  Able to use RPE to guide intensity level when exercising independently Short Term: Able to use RPE daily in rehab to express subjective intensity level;Long Term:  Able to use RPE to guide intensity level when exercising independently      Knowledge and understanding of Target Heart Rate Range (THRR) Yes Yes      Intervention Provide education and explanation of THRR including how the numbers were predicted and where they are located for reference Provide education and explanation of THRR including how the numbers were predicted and where they are located for reference      Expected Outcomes Short Term: Able to state/look up THRR;Long Term: Able to use THRR to govern intensity when exercising independently;Short Term: Able to use daily as guideline for intensity in rehab Short Term: Able to  state/look up THRR;Long Term: Able to use THRR to govern intensity when exercising independently;Short Term: Able to use daily as guideline for intensity in rehab      Able to check pulse independently Yes Yes      Intervention Provide education and demonstration on how to check pulse in carotid and radial arteries.;Review the importance of being able to check your own pulse for safety during independent exercise Provide education and demonstration on how to check pulse in carotid and radial arteries.;Review the importance of being able to check your own pulse for safety during  independent exercise      Expected Outcomes Short Term: Able to explain why pulse checking is important during independent exercise;Long Term: Able to check pulse independently and accurately Short Term: Able to explain why pulse checking is important during independent exercise;Long Term: Able to check pulse independently and accurately      Understanding of Exercise Prescription Yes Yes      Intervention Provide education, explanation, and written materials on patient's individual exercise prescription Provide education, explanation, and written materials on patient's individual exercise prescription      Expected Outcomes Short Term: Able to explain program exercise prescription;Long Term: Able to explain home exercise prescription to exercise independently Short Term: Able to explain program exercise prescription;Long Term: Able to explain home exercise prescription to exercise independently               Exercise Goals Re-Evaluation :  Exercise Goals Re-Evaluation     Row Name 08/26/22 1215             Exercise Goal Re-Evaluation   Exercise Goals Review Increase Physical Activity;Able to understand and use rate of perceived exertion (RPE) scale;Increase Strength and Stamina;Knowledge and understanding of Target Heart Rate Range (THRR);Able to check pulse independently;Understanding of Exercise Prescription        Comments Pt has completed 10 sessions of cardiac rehab. She has been able to increase her workloads and she is motivated to progress. She reports that she is walking several days per week outside of rehab, and I reveiwed home exercise guidelines with her today. She is currently exercising at 3.91 METs on the treadmill. Will continue to monitor and progress as able.       Expected Outcomes Through exercise at rehab and at home, the patient will meet their stated goals.                 Discharge Exercise Prescription (Final Exercise Prescription Changes):  Exercise Prescription Changes - 08/26/22 1200       Response to Exercise   Blood Pressure (Admit) 128/74    Blood Pressure (Exercise) 134/68    Blood Pressure (Exit) 116/72    Heart Rate (Admit) 83 bpm    Heart Rate (Exercise) 100 bpm    Heart Rate (Exit) 91 bpm    Rating of Perceived Exertion (Exercise) 12    Duration Continue with 30 min of aerobic exercise without signs/symptoms of physical distress.    Intensity THRR unchanged      Progression   Progression Continue to progress workloads to maintain intensity without signs/symptoms of physical distress.      Resistance Training   Training Prescription Yes    Weight 3    Reps 10-15    Time 10 Minutes      Treadmill   MPH 2.8    Grade 2    Minutes 17    METs 3.91      NuStep   Level 2    SPM 103    Minutes 22    METs 2.37             Nutrition:  Target Goals: Understanding of nutrition guidelines, daily intake of sodium '1500mg'$ , cholesterol '200mg'$ , calories 30% from fat and 7% or less from saturated fats, daily to have 5 or more servings of fruits and vegetables.  Biometrics:  Pre Biometrics - 08/07/22 1132       Pre Biometrics   Height '5\' 3"'$  (1.6 m)    Weight 79 kg  Waist Circumference 40.5 inches    Hip Circumference 41 inches    Waist to Hip Ratio 0.99 %    BMI (Calculated) 30.86    Triceps Skinfold 23 mm    % Body Fat 40.2 %    Grip Strength  22.9 kg    Flexibility 11 in    Single Leg Stand 19.86 seconds              Nutrition Therapy Plan and Nutrition Goals:  Nutrition Therapy & Goals - 08/19/22 0946       Personal Nutrition Goals   Comments Patient scored 64 on her diet assessment. We offer 2 educational sessions on heart healthy nutrition with handouts and assistance with RD referral if patient is interested.      Intervention Plan   Intervention Nutrition handout(s) given to patient.    Expected Outcomes Short Term Goal: Understand basic principles of dietary content, such as calories, fat, sodium, cholesterol and nutrients.             Nutrition Assessments:  Nutrition Assessments - 08/05/22 1259       MEDFICTS Scores   Pre Score 64            MEDIFICTS Score Key: ?70 Need to make dietary changes  40-70 Heart Healthy Diet ? 40 Therapeutic Level Cholesterol Diet   Picture Your Plate Scores: <15 Unhealthy dietary pattern with much room for improvement. 41-50 Dietary pattern unlikely to meet recommendations for good health and room for improvement. 51-60 More healthful dietary pattern, with some room for improvement.  >60 Healthy dietary pattern, although there may be some specific behaviors that could be improved.    Nutrition Goals Re-Evaluation:   Nutrition Goals Discharge (Final Nutrition Goals Re-Evaluation):   Psychosocial: Target Goals: Acknowledge presence or absence of significant depression and/or stress, maximize coping skills, provide positive support system. Participant is able to verbalize types and ability to use techniques and skills needed for reducing stress and depression.  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 Yes      Barriers   Psychosocial barriers to participate in program There are no identifiable barriers or psychosocial needs.      Screening Interventions   Interventions Encouraged to exercise    Expected Outcomes Long Term goal: The participant improves quality of Life and PHQ9 Scores as seen by post scores and/or verbalization of changes;Short Term goal: Identification and review with participant of any Quality of Life or Depression concerns found by scoring the questionnaire.             Quality of Life Scores:  Quality of Life - 08/05/22 1347       Quality of Life   Select Quality of Life      Quality of Life Scores   Health/Function Pre 19.2 %    Socioeconomic Pre 19.86 %    Psych/Spiritual Pre 19.42 %    Family Pre 20.1 %    GLOBAL Pre 19.52 %            Scores of 19 and below usually indicate a poorer quality of life in these areas.  A  difference of  2-3 points is a clinically meaningful difference.  A difference of 2-3 points in the total score of the Quality of Life Index has been associated with significant improvement in overall quality of life, self-image, physical symptoms, and general health in studies assessing change in quality of life.  PHQ-9: Review Flowsheet       08/05/2022 07/21/2014  Depression screen PHQ 2/9  Decreased Interest 0 0  Down, Depressed, Hopeless 1 0  PHQ - 2 Score 1 0  Altered sleeping 1 -  Tired, decreased energy 2 -  Change in appetite 2 -  Feeling bad or failure about yourself  1 -  Trouble concentrating 0 -  Moving slowly or fidgety/restless 0 -  Suicidal thoughts 0 -  PHQ-9 Score 7 -  Difficult doing work/chores Somewhat difficult -   Interpretation of Total Score  Total Score Depression Severity:  1-4 = Minimal depression, 5-9 = Mild depression, 10-14 = Moderate depression, 15-19 = Moderately severe depression, 20-27 = Severe depression   Psychosocial Evaluation and  Intervention:  Psychosocial Evaluation - 08/05/22 1347       Psychosocial Evaluation & Interventions   Interventions Encouraged to exercise with the program and follow exercise prescription    Comments Pt has no barriers to participating in CR. She does report sleep and stress concerns, but she denies anxiety and depression. She reports having trouble staying asleep and that she frequently wakes up in the night. She does take melatonin or benadryl occassionaly. Her stress concerns are due to work. 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 She scored a 7 on her PHQ-9. She reports that her energy levels have been low since her stent, and this also causes her to feel down. She reports that she has a good support system with her husband. Her goals in the program are to learn more about heart disease and to learn more about nutrition. She is eager to start the program and will be coming during her lunch break from work.    Expected Outcomes Pt's stressors will be reduced and she will have no other identifiable psychosocial issues.    Continue Psychosocial Services  No Follow up required             Psychosocial Re-Evaluation:  Psychosocial Re-Evaluation     [place] Name 08/19/22 0946             Psychosocial Re-Evaluation   Current issues with Current Sleep Concerns;Current Stress Concerns       Comments Patient is new to the program completing 6 sessions. She continues to have no psychosocial barriers identified. She continues to use Melatonin and Benadryl for sleep. She feels she is able to manage the stress from her job. She seems to enjoy the sessions and demonstrates an intertest in improving her health. We will continue to monitor.       Expected Outcomes Patient will continue to have no psychosocial barriers identified.       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 She continues to have no psychosocial barriers identified. She continues to use Melatonin and Benadryl for sleep. She feels she is able to manage the stress from her job. She seems to enjoy the sessions and demonstrates an intertest in improving her health. We will continue to monitor.    Expected Outcomes Patient will continue to have no psychosocial barriers identified.    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   Vocational Rehab Evaluation & Intervention:  Vocational Rehab - 08/05/22 1307       Initial Vocational Rehab Evaluation & Intervention   Assessment shows need for Vocational Rehabilitation No      Vocational Rehab Re-Evaulation   Comments Already returned to work             Education: Education Goals: Education classes will be provided on a weekly basis, covering required topics. Participant will state understanding/return demonstration of topics presented.  Learning Barriers/Preferences:  Learning Barriers/Preferences - 08/05/22  1305       Learning Barriers/Preferences   Learning Barriers None    Learning Preferences Audio;Computer/Internet;Group Instruction;Individual Instruction;Pictoral;Skilled Demonstration;Verbal Instruction;Video;Written Material             Education Topics: Hypertension, Hypertension Reduction -Define heart disease and high blood pressure. Discus how high blood pressure affects the body and ways to reduce high blood pressure. Flowsheet Row CARDIAC REHAB PHASE II EXERCISE from 08/21/2022 in [facility]  Date 08/21/22  Educator [place]  Instruction Review Code 1- Verbalizes Understanding       Exercise and Your Heart -Discuss why it is important to exercise, the FITT principles of exercise, normal and abnormal responses to exercise, and how to exercise safely.   Angina -Discuss definition of angina, causes of angina, treatment of angina, and how to decrease risk of having angina.   Cardiac Medications -Review what the following cardiac medications are used for, how they affect the body, and side effects that may occur when taking the medications.  Medications include Aspirin, Beta blockers, calcium channel blockers, ACE Inhibitors, angiotensin receptor blockers, diuretics, digoxin, and antihyperlipidemics.   Congestive Heart Failure -Discuss the definition of CHF, how to live with CHF, the signs and symptoms of CHF, and how keep track of weight and sodium intake.   Heart Disease and Intimacy -Discus the effect sexual activity has on the heart, how changes occur during intimacy as we age, and safety during sexual activity.   Smoking Cessation / COPD -Discuss different methods to quit smoking, the health benefits of quitting smoking, and the definition of COPD.   Nutrition I: Fats -Discuss the types of cholesterol, what cholesterol does to the heart, and how cholesterol levels can be controlled.   Nutrition II: Labels -Discuss the different components  of food labels and how to read food label   Heart Parts/Heart Disease and PAD -Discuss the anatomy of the heart,  the pathway of blood circulation through the heart, and these are affected by heart disease.   Stress I: Signs and Symptoms -Discuss the causes of stress, how stress may lead to anxiety and depression, and ways to limit stress. Flowsheet Row CARDIAC REHAB PHASE II EXERCISE from 08/21/2022 in [place]  Date 08/07/22  Educator DF  Instruction Review Code 1- Verbalizes Understanding       Stress II: Relaxation -Discuss different types of relaxation techniques to limit stress. Flowsheet Row CARDIAC REHAB PHASE II EXERCISE from 08/21/2022 in [place]  Date 08/14/22  Educator hj  Instruction Review Code 1- Verbalizes Understanding       Warning Signs of Stroke / TIA -Discuss definition of a stroke, what the signs and symptoms are of a stroke, and how to identify when someone is having stroke.   Knowledge Questionnaire Score:  Knowledge Questionnaire Score - 08/05/22 1305       Knowledge Questionnaire Score   Pre Score 23/24             Core Components/Risk Factors/Patient Goals at Admission:  Personal Goals and Risk Factors at Admission - 08/05/22 1308       Core Components/Risk Factors/Patient Goals on Admission   Diabetes Yes    Intervention Provide education about signs/symptoms and action to take for hypo/hyperglycemia.;Provide education about proper nutrition, including hydration, and aerobic/resistive exercise prescription along with prescribed medications to achieve blood glucose in normal ranges: Fasting glucose 65-99 mg/dL    Expected Outcomes Short Term: Participant verbalizes understanding of the signs/symptoms and immediate care of hyper/hypoglycemia, proper foot care and importance of medication, aerobic/resistive exercise and nutrition plan for blood glucose control.;Long Term: Attainment of HbA1C <  7%.    Hypertension Yes    Intervention Provide education on lifestyle modifcations including regular physical activity/exercise, weight management, moderate sodium restriction and increased consumption of fresh fruit, vegetables, and low fat dairy, alcohol moderation, and smoking cessation.;Monitor prescription use compliance.    Expected Outcomes Short Term: Continued assessment and intervention until BP is < 140/36m HG in hypertensive participants. < 130/864mHG in hypertensive participants with diabetes, heart failure or chronic kidney disease.;Long Term: Maintenance of blood pressure at goal levels.    Lipids Yes    Intervention Provide education and support for participant on nutrition & aerobic/resistive exercise along with prescribed medications to achieve LDL '70mg'$ , HDL >'40mg'$ .    Expected Outcomes Short Term: Participant states understanding of desired cholesterol values and is compliant with medications prescribed. Participant is following exercise prescription and nutrition guidelines.;Long Term: Cholesterol controlled with medications as prescribed, with individualized exercise RX and with personalized nutrition plan. Value goals: LDL < '70mg'$ , HDL > 40 mg.    Personal Goal Other Yes    Personal Goal Learn about diet and heart disease.    Intervention Attend cardiac rehab education sessions.    Expected Outcomes Pt will meet stated goals.             Core Components/Risk Factors/Patient Goals Review:   Goals and Risk Factor Review     Row Name 08/19/22 0951             Core Components/Risk Factors/Patient Goals Review   Personal Goals Review Hypertension;Diabetes;Lipids;Other       Review Patient was referred to CR with Stent placement. She has multiple risk factors for CAD and is participating in the program for risk modification. She has completed 6 sessions. Hercurrent weight is 174.4 lbs maintained since  her initial visit. She is doing well in the program with progressions  and consistent attendance. Her blood pressure is at goal. Her DM is managed by Actos, Ozempic, Metformin, Jardiance and glimperide. No recent A1C on file. Her personal goals for the program are to learn about nutrition and heart disease. We will continue to monitor her progress as she works towards meeting these goals.       Expected Outcomes Patient will complete the program meeting both personal and program goals.                Core Components/Risk Factors/Patient Goals at Discharge (Final Review):   Goals and Risk Factor Review - 08/19/22 0951       Core Components/Risk Factors/Patient Goals Review   Personal Goals Review Hypertension;Diabetes;Lipids;Other    Review Patient was referred to CR with Stent placement. She has multiple risk factors for CAD and is participating in the program for risk modification. She has completed 6 sessions. Hercurrent weight is 174.4 lbs maintained since her initial visit. She is doing well in the program with progressions and consistent attendance. Her blood pressure is at goal. Her DM is managed by Actos, Ozempic, Metformin, Jardiance and glimperide. No recent A1C on file. Her personal goals for the program are to learn about nutrition and heart disease. We will continue to monitor her progress as she works towards meeting these goals.    Expected Outcomes Patient will complete the program meeting both personal and program goals.             ITP Comments:   Comments: ITP REVIEW Pt is making expected progress toward Cardiac Rehab goals after completing 10 sessions. Recommend continued exercise, life style modification, education, and increased stamina and strength.

## 2022-08-28 NOTE — Progress Notes (Signed)
Daily Session Note  Patient Details  Name: WENDELYN KIESLING MRN: [id number] Date of Birth: 22-May-1976 Referring Provider:   Flowsheet Row CARDIAC REHAB PHASE II EXERCISE from 08/07/2022 in [place]  Referring Provider Dr. Tamala Julian       Encounter Date: 08/28/2022  Check In:  Session Check In - 08/28/22 1100       Check-In   Supervising physician immediately available to respond to emergencies [facility] MD immediately available    Physician(s) Dr. Debara Pickett    Location [facility]    Staff Present Redge Gainer, BS, Exercise Physiologist;Dalton Kris Mouton, MS, ACSM-CEP, Exercise Physiologist;Other    Virtual Visit No    Medication changes reported     No    Fall or balance concerns reported    No    Tobacco Cessation No Change    Warm-up and Cool-down Performed on first and last piece of equipment    Resistance Training Performed Yes    VAD Patient? No    PAD/SET Patient? No      Pain Assessment   Currently in Pain? No/denies    Multiple Pain Sites No             Capillary Blood Glucose: No results found for this or any previous visit (from the past 24 hour(s)).    Social History   Tobacco Use  Smoking Status Former   Packs/day: 0.25   Years: 15.00   Total pack years: 3.75   Types: Cigarettes   Quit date: 07/02/2022   Years since quitting: 0.1  Smokeless Tobacco Never  Tobacco Comments   "im a casual mosker, every now and then"     Goals Met:  Independence with exercise equipment Exercise tolerated well No report of concerns or symptoms today Strength training completed today  Goals Unmet:  Not Applicable  Comments: check out at 12:00   Dr. Carlyle Dolly is Medical Director for [place]

## 2022-08-30 ENCOUNTER — Other Ambulatory Visit: Payer: Self-pay

## 2022-08-30 ENCOUNTER — Encounter (HOSPITAL_COMMUNITY)
Admission: RE | Admit: 2022-08-30 | Discharge: 2022-08-30 | Disposition: A | Discharge status: Home / Self Care | Payer: Commercial Managed Care - PPO | Source: Ambulatory Visit | Attending: Internal Medicine | Admitting: Internal Medicine

## 2022-08-30 DIAGNOSIS — Z955 Presence of coronary angioplasty implant and graft: Secondary | ICD-10-CM

## 2022-08-30 DIAGNOSIS — E785 Hyperlipidemia, unspecified: Secondary | ICD-10-CM

## 2022-08-30 NOTE — Progress Notes (Signed)
Daily Session Note  Patient Details  Name: Carla Knight MRN: [id number] Date of Birth: 10-23-1976 Referring Provider:   Flowsheet Row CARDIAC REHAB PHASE II EXERCISE from 08/07/2022 in [place]  Referring Provider Dr. Tamala Julian       Encounter Date: 08/30/2022  Check In:  Session Check In - 08/30/22 1056       Check-In   Supervising physician immediately available to respond to emergencies [facility] immediately available    Physician(s) Dr. Gasper Sells    Location [facility]    Staff Present Redge Gainer, BS, Exercise Physiologist;Nora Rooke Wynetta Emery, RN, Joanette Gula, RN, BSN    Virtual Visit No    Medication changes reported     No    Fall or balance concerns reported    No    Tobacco Cessation No Change    Warm-up and Cool-down Performed as group-led instruction    Resistance Training Performed Yes    VAD Patient? No    PAD/SET Patient? No      Pain Assessment   Currently in Pain? No/denies    Multiple Pain Sites No             Capillary Blood Glucose: No results found for this or any previous visit (from the past 24 hour(s)).    Social History   Tobacco Use  Smoking Status Former   Packs/day: 0.25   Years: 15.00   Total pack years: 3.75   Types: Cigarettes   Quit date: 07/02/2022   Years since quitting: 0.1  Smokeless Tobacco Never  Tobacco Comments   "im a casual mosker, every now and then"     Goals Met:  Independence with exercise equipment Exercise tolerated well No report of concerns or symptoms today Strength training completed today  Goals Unmet:  Not Applicable  Comments: Check out 1200.   Dr. Carlyle Dolly is Medical Director for [name]

## 2022-08-31 LAB — HEPATIC FUNCTION PANEL
ALT: 16 IU/L (ref 0–32)
AST: 13 IU/L (ref 0–40)
Albumin: 4.2 g/dL (ref 3.9–4.9)
Alkaline Phosphatase: 103 IU/L (ref 44–121)
Bilirubin Total: 0.6 mg/dL (ref 0.0–1.2)
Bilirubin, Direct: 0.18 mg/dL (ref 0.00–0.40)
Total Protein: 6.4 g/dL (ref 6.0–8.5)

## 2022-08-31 LAB — LIPID PANEL
Chol/HDL Ratio: 3.1 ratio (ref 0.0–4.4)
Cholesterol, Total: 107 mg/dL (ref 100–199)
HDL: 34 mg/dL — ABNORMAL LOW (ref >39)
LDL Chol Calc (NIH): 39 mg/dL (ref 0–99)
Triglycerides: 211 mg/dL — ABNORMAL HIGH (ref 0–149)
VLDL Cholesterol Cal: 34 mg/dL (ref 5–40)

## 2022-09-02 ENCOUNTER — Encounter (HOSPITAL_COMMUNITY)
Admission: RE | Admit: 2022-09-02 | Discharge: 2022-09-02 | Disposition: A | Discharge status: Home / Self Care | Payer: Commercial Managed Care - PPO | Source: Ambulatory Visit | Attending: Internal Medicine | Admitting: Internal Medicine

## 2022-09-02 DIAGNOSIS — Z955 Presence of coronary angioplasty implant and graft: Secondary | ICD-10-CM

## 2022-09-02 NOTE — Progress Notes (Signed)
Daily Session Note  Patient Details  Name: Carla Knight MRN: [id number] Date of Birth: [birth year] Referring Provider:   Flowsheet Row CARDIAC REHAB PHASE II EXERCISE from 08/07/2022 in [place]  Referring Provider Dr. Tamala Julian       Encounter Date: 09/02/2022  Check In:  Session Check In - 09/02/22 1100       Check-In   Supervising physician immediately available to respond to emergencies [facility] immediately available    Physician(s) Dr. Audie Box    Location [facility]    Staff Present Geanie Cooley, RN;Heather Otho Ket, BS, Exercise Physiologist;Daphyne Hassell Done, RN, Bjorn Loser, MS, ACSM-CEP, Exercise Physiologist    Virtual Visit No    Medication changes reported     No    Fall or balance concerns reported    No    Tobacco Cessation No Change    Warm-up and Cool-down Performed as group-led instruction    Resistance Training Performed Yes    VAD Patient? No    PAD/SET Patient? No      Pain Assessment   Currently in Pain? No/denies    Multiple Pain Sites No             Capillary Blood Glucose: No results found for this or any previous visit (from the past 24 hour(s)).    Social History   Tobacco Use  Smoking Status Former   Packs/day: 0.25   Years: 15.00   Total pack years: 3.75   Types: Cigarettes   Quit date: 07/02/2022   Years since quitting: 0.1  Smokeless Tobacco Never  Tobacco Comments   "im a casual mosker, every now and then"     Goals Met:  Independence with exercise equipment Exercise tolerated well No report of concerns or symptoms today Strength training completed today  Goals Unmet:  Not Applicable  Comments: check out @ 12:00pm    Dr. Carlyle Dolly is Medical Director for [place]

## 2022-09-04 ENCOUNTER — Encounter (HOSPITAL_COMMUNITY): Payer: Commercial Managed Care - PPO

## 2022-09-06 ENCOUNTER — Encounter (HOSPITAL_COMMUNITY)
Admission: RE | Admit: 2022-09-06 | Discharge: 2022-09-06 | Disposition: A | Discharge status: Home / Self Care | Payer: Commercial Managed Care - PPO | Source: Ambulatory Visit | Attending: Internal Medicine | Admitting: Internal Medicine

## 2022-09-06 DIAGNOSIS — Z955 Presence of coronary angioplasty implant and graft: Secondary | ICD-10-CM | POA: Diagnosis not present

## 2022-09-06 NOTE — Progress Notes (Signed)
Daily Session Note  Patient Details  Name: Carla Knight MRN: [id number] Date of Birth: Oct 14, 1976 Referring Provider:   Flowsheet Row CARDIAC REHAB PHASE II EXERCISE from 08/07/2022 in [place]  Referring Provider Dr. Tamala Julian       Encounter Date: 09/06/2022  Check In:  Session Check In - 09/06/22 1100       Check-In   Supervising physician immediately available to respond to emergencies [facility] immediately available    Physician(s) Dr. Dellia Cloud    Location [facility]    Staff Present Hoy Register, MS, ACSM-CEP, Exercise Physiologist;Debra Wynetta Emery, RN, BSN    Virtual Visit No    Medication changes reported     No    Fall or balance concerns reported    No    Tobacco Cessation No Change    Warm-up and Cool-down Performed as group-led instruction    Resistance Training Performed Yes    VAD Patient? No    PAD/SET Patient? No      Pain Assessment   Currently in Pain? No/denies    Multiple Pain Sites No             Capillary Blood Glucose: No results found for this or any previous visit (from the past 24 hour(s)).    Social History   Tobacco Use  Smoking Status Former   Packs/day: 0.25   Years: 15.00   Total pack years: 3.75   Types: Cigarettes   Quit date: 07/02/2022   Years since quitting: 0.1  Smokeless Tobacco Never  Tobacco Comments   "im a casual mosker, every now and then"     Goals Met:  Independence with exercise equipment Exercise tolerated well No report of concerns or symptoms today Strength training completed today  Goals Unmet:  Not Applicable  Comments: checkout time is 1200   Dr. Carlyle Dolly is Medical Director for [place]

## 2022-09-09 ENCOUNTER — Encounter (HOSPITAL_COMMUNITY)
Admission: RE | Admit: 2022-09-09 | Discharge: 2022-09-09 | Disposition: A | Discharge status: Home / Self Care | Payer: Commercial Managed Care - PPO | Source: Ambulatory Visit | Attending: Internal Medicine | Admitting: Internal Medicine

## 2022-09-09 VITALS — Wt 176.6 lb

## 2022-09-09 DIAGNOSIS — Z955 Presence of coronary angioplasty implant and graft: Secondary | ICD-10-CM

## 2022-09-09 NOTE — Progress Notes (Signed)
Daily Session Note  Patient Details  Name: Carla Knight MRN: [id number] Date of Birth: [birth year] Referring Provider:   Flowsheet Row CARDIAC REHAB PHASE II EXERCISE from 08/07/2022 in [place]  Referring Provider Dr. Tamala Julian       Encounter Date: 09/09/2022  Check In:  Session Check In - 09/09/22 1100       Check-In   Supervising physician immediately available to respond to emergencies [facility] immediately available    Physician(s) Dr Harl Bowie    Location [facility]    Staff Present Redge Gainer, BS, Exercise Physiologist;Rosha Cocker Hassell Done, RN, BSN    Virtual Visit No    Medication changes reported     No    Fall or balance concerns reported    No    Tobacco Cessation No Change    Warm-up and Cool-down Performed as group-led instruction    Resistance Training Performed Yes    VAD Patient? No    PAD/SET Patient? No      Pain Assessment   Currently in Pain? No/denies    Multiple Pain Sites No             Capillary Blood Glucose: No results found for this or any previous visit (from the past 24 hour(s)).    Social History   Tobacco Use  Smoking Status Former   Packs/day: 0.25   Years: 15.00   Total pack years: 3.75   Types: Cigarettes   Quit date: 07/02/2022   Years since quitting: 0.1  Smokeless Tobacco Never  Tobacco Comments   "im a casual mosker, every now and then"     Goals Met:  Independence with exercise equipment Exercise tolerated well No report of concerns or symptoms today Strength training completed today  Goals Unmet:  Not Applicable  Comments: Checkout at 1200.   Dr. Carlyle Dolly is Medical Director for [facility]

## 2022-09-11 ENCOUNTER — Encounter (HOSPITAL_COMMUNITY)
Admission: RE | Admit: 2022-09-11 | Discharge: 2022-09-11 | Disposition: A | Discharge status: Home / Self Care | Payer: Commercial Managed Care - PPO | Source: Ambulatory Visit | Attending: Internal Medicine | Admitting: Internal Medicine

## 2022-09-11 DIAGNOSIS — Z955 Presence of coronary angioplasty implant and graft: Secondary | ICD-10-CM | POA: Diagnosis not present

## 2022-09-11 NOTE — Progress Notes (Signed)
Daily Session Note  Patient Details  Name: Carla Knight MRN: [id number] Date of Birth: 04-15-1976 Referring Provider:   Flowsheet Row CARDIAC REHAB PHASE II EXERCISE from 08/07/2022 in [place]  Referring Provider Dr. Tamala Julian       Encounter Date: 09/11/2022  Check In:  Session Check In - 09/11/22 1100       Check-In   Supervising physician immediately available to respond to emergencies [facility] MD immediately available    Physician(s) Dr. Marlou Porch    Location [facility]    Staff Present Redge Gainer, BS, Exercise Physiologist;Daphyne Hassell Done, RN, BSN    Virtual Visit No    Medication changes reported     No    Fall or balance concerns reported    No    Tobacco Cessation No Change    Warm-up and Cool-down Performed as group-led instruction    Resistance Training Performed Yes    VAD Patient? No    PAD/SET Patient? No      Pain Assessment   Currently in Pain? No/denies    Multiple Pain Sites No             Capillary Blood Glucose: No results found for this or any previous visit (from the past 24 hour(s)).    Social History   Tobacco Use  Smoking Status Former   Packs/day: 0.25   Years: 15.00   Total pack years: 3.75   Types: Cigarettes   Quit date: 07/02/2022   Years since quitting: 0.1  Smokeless Tobacco Never  Tobacco Comments   "im a casual mosker, every now and then"     Goals Met:  Independence with exercise equipment Exercise tolerated well No report of concerns or symptoms today Strength training completed today  Goals Unmet:  Not Applicable  Comments: check out 1200   Dr. Carlyle Dolly is Medical Director for [place]

## 2022-09-13 ENCOUNTER — Encounter (HOSPITAL_COMMUNITY)
Admission: RE | Admit: 2022-09-13 | Discharge: 2022-09-13 | Disposition: A | Discharge status: Home / Self Care | Payer: Commercial Managed Care - PPO | Source: Ambulatory Visit | Attending: Internal Medicine | Admitting: Internal Medicine

## 2022-09-13 DIAGNOSIS — Z955 Presence of coronary angioplasty implant and graft: Secondary | ICD-10-CM | POA: Diagnosis not present

## 2022-09-13 NOTE — Progress Notes (Signed)
Daily Session Note  Patient Details  Name: Carla Knight MRN: [id number] Date of Birth: [birth year] Referring Provider:   Flowsheet Row CARDIAC REHAB PHASE II EXERCISE from 08/07/2022 in [place]  Referring Provider Dr. Tamala Julian       Encounter Date: 09/13/2022  Check In:  Session Check In - 09/13/22 1057       Check-In   Supervising physician immediately available to respond to emergencies [facility] MD immediately available    Physician(s) Dr. Harl Bowie    Location [facility]    Staff Present Aundra Dubin, RN, BSN;Beaux Wedemeyer Sherrie George, MS, ACSM-CEP;Leana Roe, BS, Exercise Physiologist    Virtual Visit No    Medication changes reported     No    Fall or balance concerns reported    No    Tobacco Cessation No Change    Warm-up and Cool-down Performed as group-led instruction    Resistance Training Performed Yes    VAD Patient? No    PAD/SET Patient? No      Pain Assessment   Currently in Pain? No/denies    Multiple Pain Sites No             Capillary Blood Glucose: No results found for this or any previous visit (from the past 24 hour(s)).    Social History   Tobacco Use  Smoking Status Former   Packs/day: 0.25   Years: 15.00   Total pack years: 3.75   Types: Cigarettes   Quit date: 07/02/2022   Years since quitting: 0.2  Smokeless Tobacco Never  Tobacco Comments   "im a casual mosker, every now and then"     Goals Met:  Independence with exercise equipment Exercise tolerated well No report of concerns or symptoms today Strength training completed today  Goals Unmet:  Not Applicable  Comments: checkout time is 1200   Dr. Carlyle Dolly is Medical Director for [place]

## 2022-09-16 ENCOUNTER — Encounter (HOSPITAL_COMMUNITY)
Admission: RE | Admit: 2022-09-16 | Discharge: 2022-09-16 | Disposition: A | Discharge status: Home / Self Care | Payer: Commercial Managed Care - PPO | Source: Ambulatory Visit | Attending: Internal Medicine | Admitting: Internal Medicine

## 2022-09-16 DIAGNOSIS — Z955 Presence of coronary angioplasty implant and graft: Secondary | ICD-10-CM | POA: Diagnosis not present

## 2022-09-16 NOTE — Progress Notes (Signed)
Daily Session Note  Patient Details  Name: Carla Knight MRN: [id number] Date of Birth: 24-Apr-1976 Referring Provider:   Flowsheet Row CARDIAC REHAB PHASE II EXERCISE from 08/07/2022 in [place]  Referring Provider Dr. Tamala Julian       Encounter Date: 09/16/2022  Check In:  Session Check In - 09/16/22 1100       Check-In   Supervising physician immediately available to respond to emergencies [facility] MD immediately available    Physician(s) Dr. Domenic Polite    Location [facility]    Staff Present Hoy Register MHA, MS, ACSM-CEP;Leana Roe, BS, Exercise Physiologist    Virtual Visit No    Medication changes reported     No    Fall or balance concerns reported    No    Tobacco Cessation No Change    Warm-up and Cool-down Performed as group-led instruction    Resistance Training Performed Yes    VAD Patient? No    PAD/SET Patient? No      Pain Assessment   Currently in Pain? No/denies    Multiple Pain Sites No             Capillary Blood Glucose: No results found for this or any previous visit (from the past 24 hour(s)).    Social History   Tobacco Use  Smoking Status Former   Packs/day: 0.25   Years: 15.00   Total pack years: 3.75   Types: Cigarettes   Quit date: 07/02/2022   Years since quitting: 0.2  Smokeless Tobacco Never  Tobacco Comments   "im a casual mosker, every now and then"     Goals Met:  Independence with exercise equipment Exercise tolerated well No report of concerns or symptoms today Strength training completed today  Goals Unmet:  Not Applicable  Comments: check out 1200   Dr. Carlyle Dolly is Medical Director for [place]

## 2022-09-18 ENCOUNTER — Encounter (HOSPITAL_COMMUNITY)
Admission: RE | Admit: 2022-09-18 | Discharge: 2022-09-18 | Disposition: A | Discharge status: Home / Self Care | Payer: Commercial Managed Care - PPO | Source: Ambulatory Visit | Attending: Internal Medicine | Admitting: Internal Medicine

## 2022-09-18 DIAGNOSIS — Z955 Presence of coronary angioplasty implant and graft: Secondary | ICD-10-CM | POA: Insufficient documentation

## 2022-09-18 NOTE — Progress Notes (Signed)
Daily Session Note  Patient Details  Name: Carla Knight MRN: [id number] Date of Birth: 13-Mar-1976 Referring Provider:   Flowsheet Row CARDIAC REHAB PHASE II EXERCISE from 08/07/2022 in [place]  Referring Provider Dr. Tamala Julian       Encounter Date: 09/18/2022  Check In:  Session Check In - 09/18/22 1100       Check-In   Supervising physician immediately available to respond to emergencies [facility] MD immediately available    Physician(s) Dr. Domenic Polite    Location [facility]    Staff Present Hoy Register MHA, MS, ACSM-CEP;Leana Roe, BS, Exercise Physiologist    Virtual Visit No    Medication changes reported     No    Fall or balance concerns reported    No    Tobacco Cessation No Change    Warm-up and Cool-down Performed as group-led instruction    Resistance Training Performed Yes    VAD Patient? No    PAD/SET Patient? No      Pain Assessment   Currently in Pain? No/denies    Multiple Pain Sites No             Capillary Blood Glucose: No results found for this or any previous visit (from the past 24 hour(s)).    Social History   Tobacco Use  Smoking Status Former   Packs/day: 0.25   Years: 15.00   Total pack years: 3.75   Types: Cigarettes   Quit date: 07/02/2022   Years since quitting: 0.2  Smokeless Tobacco Never  Tobacco Comments   "im a casual mosker, every now and then"     Goals Met:  Independence with exercise equipment Exercise tolerated well No report of concerns or symptoms today Strength training completed today  Goals Unmet:  Not Applicable  Comments: check out 120   Dr. Carlyle Dolly is Medical Director for [place]

## 2022-09-20 ENCOUNTER — Encounter (HOSPITAL_COMMUNITY)
Admission: RE | Admit: 2022-09-20 | Discharge: 2022-09-20 | Disposition: A | Discharge status: Home / Self Care | Payer: Commercial Managed Care - PPO | Source: Ambulatory Visit | Attending: Internal Medicine | Admitting: Internal Medicine

## 2022-09-20 DIAGNOSIS — Z955 Presence of coronary angioplasty implant and graft: Secondary | ICD-10-CM | POA: Diagnosis not present

## 2022-09-20 NOTE — Progress Notes (Signed)
Daily Session Note  Patient Details  Name: Carla Knight MRN: [id number] Date of Birth: 03-08-1976 Referring Provider:   Flowsheet Row CARDIAC REHAB PHASE II EXERCISE from 08/07/2022 in [place]  Referring Provider Dr. Tamala Julian       Encounter Date: 09/20/2022  Check In:  Session Check In - 09/20/22 1100       Check-In   Supervising physician immediately available to respond to emergencies [facility] MD immediately available    Physician(s) Dr. Zandra Abts    Location [facility]    Staff Present Leana Roe, BS, Exercise Physiologist;Zoltan Genest Wynetta Emery, RN, Joanette Gula, RN, BSN    Virtual Visit No    Medication changes reported     No    Fall or balance concerns reported    No    Tobacco Cessation No Change    Warm-up and Cool-down Performed as group-led instruction    Resistance Training Performed Yes    VAD Patient? No    PAD/SET Patient? No      Pain Assessment   Currently in Pain? No/denies    Multiple Pain Sites No             Capillary Blood Glucose: No results found for this or any previous visit (from the past 24 hour(s)).    Social History   Tobacco Use  Smoking Status Former   Packs/day: 0.25   Years: 15.00   Total pack years: 3.75   Types: Cigarettes   Quit date: 07/02/2022   Years since quitting: 0.2  Smokeless Tobacco Never  Tobacco Comments   "im a casual mosker, every now and then"     Goals Met:  Independence with exercise equipment Exercise tolerated well No report of concerns or symptoms today Strength training completed today  Goals Unmet:  Not Applicable  Comments: Check out 1200.   Dr. Carlyle Dolly is Medical Director for [facility]

## 2022-09-23 ENCOUNTER — Encounter (HOSPITAL_COMMUNITY)
Admission: RE | Admit: 2022-09-23 | Discharge: 2022-09-23 | Disposition: A | Discharge status: Home / Self Care | Payer: Commercial Managed Care - PPO | Source: Ambulatory Visit | Attending: Internal Medicine | Admitting: Internal Medicine

## 2022-09-23 VITALS — Wt 173.1 lb

## 2022-09-23 DIAGNOSIS — Z955 Presence of coronary angioplasty implant and graft: Secondary | ICD-10-CM

## 2022-09-25 ENCOUNTER — Encounter (HOSPITAL_COMMUNITY)
Admission: RE | Admit: 2022-09-25 | Discharge: 2022-09-25 | Disposition: A | Discharge status: Home / Self Care | Payer: Commercial Managed Care - PPO | Source: Ambulatory Visit | Attending: Internal Medicine | Admitting: Internal Medicine

## 2022-09-25 DIAGNOSIS — Z955 Presence of coronary angioplasty implant and graft: Secondary | ICD-10-CM

## 2022-09-25 NOTE — Progress Notes (Signed)
Daily Session Note  Patient Details  Name: EDISON WOLLSCHLAGER MRN: [id number] Date of Birth: 10/07/1976 Referring Provider:   Flowsheet Row CARDIAC REHAB PHASE II EXERCISE from 08/07/2022 in [place]  Referring Provider Dr. Tamala Julian       Encounter Date: 09/25/2022  Check In:  Session Check In - 09/25/22 1100       Check-In   Supervising physician immediately available to respond to emergencies [facility] MD immediately available    Physician(s) Dr Dellia Cloud    Location [facility]    Staff Present Leana Roe, BS, Exercise Physiologist;Dalton Sherrie George, MS, ACSM-CEP    Virtual Visit No    Medication changes reported     No    Fall or balance concerns reported    No    Tobacco Cessation No Change    Warm-up and Cool-down Performed as group-led instruction    Resistance Training Performed Yes    VAD Patient? No    PAD/SET Patient? No      Pain Assessment   Currently in Pain? No/denies    Multiple Pain Sites No             Capillary Blood Glucose: No results found for this or any previous visit (from the past 24 hour(s)).    Social History   Tobacco Use  Smoking Status Former   Packs/day: 0.25   Years: 15.00   Total pack years: 3.75   Types: Cigarettes   Quit date: 07/02/2022   Years since quitting: 0.2  Smokeless Tobacco Never  Tobacco Comments   "im a casual mosker, every now and then"     Goals Met:  Independence with exercise equipment Exercise tolerated well No report of concerns or symptoms today Strength training completed today  Goals Unmet:  Not Applicable  Comments: check out 1200   Dr. Carlyle Dolly is Medical Director for [facility]

## 2022-09-25 NOTE — Progress Notes (Signed)
Cardiac Individual Treatment Plan  Patient Details  Name: Carla Knight MRN: [id number] Date of Birth: 1975-12-04 Referring Provider:   Flowsheet Row CARDIAC REHAB PHASE II EXERCISE from 08/07/2022 in [facility]  Referring Provider Dr. Tamala Julian       Initial Encounter Date:  Flowsheet Row CARDIAC REHAB PHASE II EXERCISE from 08/07/2022 in [facility]  Date 08/07/22       Visit Diagnosis: S/P drug eluting coronary stent placement  Patient's Home Medications on Admission:  Current Outpatient Medications:    acetaminophen (TYLENOL) 500 MG tablet, Take 500 mg by mouth every 8 (eight) hours as needed for headache or moderate pain., Disp: , Rfl:    aspirin EC 81 MG tablet, Take 1 tablet (81 mg total) by mouth daily. Swallow whole., Disp: 90 tablet, Rfl: 3   atorvastatin (LIPITOR) 80 MG tablet, Take 1 tablet (80 mg total) by mouth every evening., Disp: 90 tablet, Rfl: 3   glimepiride (AMARYL) 2 MG tablet, Take 2 mg by mouth daily with breakfast., Disp: , Rfl:    icosapent Ethyl (VASCEPA) 1 g capsule, Take 2 capsules (2 g total) by mouth 2 (two) times daily., Disp: 120 capsule, Rfl: 11   JARDIANCE 25 MG TABS tablet, Take 25 mg by mouth daily., Disp: , Rfl:    ketotifen (ZADITOR) 0.025 % ophthalmic solution, Place 1 drop into both eyes daily as needed (allergies)., Disp: , Rfl:    lisinopril (PRINIVIL,ZESTRIL) 10 MG tablet, Take 10 mg by mouth every morning. , Disp: , Rfl:    metFORMIN (GLUCOPHAGE) 1000 MG tablet, Take 1 tablet (1,000 mg total) by mouth 2 (two) times daily., Disp: , Rfl:    metoprolol succinate (TOPROL XL) 25 MG 24 hr tablet, Take 1 tablet (25 mg total) by mouth daily., Disp: 30 tablet, Rfl: 11   nitroGLYCERIN (NITROSTAT) 0.4 MG SL tablet, Place 1 tablet (0.4 mg total) under the tongue every 5 (five) minutes as needed for chest pain., Disp: 25 tablet, Rfl: 0   OZEMPIC, 0.25 OR 0.5 MG/DOSE, 2 MG/3ML SOPN, Inject 0.5 mg into the skin  every Sunday., Disp: , Rfl:    Pediatric Multivit-Minerals-C (CHILDRENS GUMMIES) CHEW, Chew 2 tablets by mouth every evening., Disp: , Rfl:    pioglitazone (ACTOS) 15 MG tablet, Take 15 mg by mouth daily., Disp: , Rfl:    SYNTHROID 150 MCG tablet, Take 1 tablet (150 mcg total) by mouth daily before breakfast., Disp: 90 tablet, Rfl: 3   ticagrelor (BRILINTA) 90 MG TABS tablet, Take 1 tablet (90 mg total) by mouth 2 (two) times daily., Disp: 60 tablet, Rfl: 11  Past Medical History: Past Medical History:  Diagnosis Date   Diabetes mellitus    NIDDM, type 2    Dyslipidemia    Goiter    no current meds.   Hypertension    under control; has been on med. x 5-6 yrs.   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   right   PONV (postoperative nausea and vomiting)    nauseated when ate after surgery   Thyroid mass     Tobacco Use: Social History   Tobacco Use  Smoking Status Former   Packs/day: 0.25   Years: 15.00   Total pack years: 3.75   Types: Cigarettes   Quit date: 07/02/2022   Years since quitting: 0.2  Smokeless Tobacco Never  Tobacco  Comments   "im a casual mosker, every now and then"     Labs: Review Flowsheet  More data may exist      Latest Ref Rng & Units 06/10/2008 11/08/2011 04/29/2018 07/03/2022 08/30/2022  Labs for ITP Cardiac and Pulmonary Rehab  Cholestrol 100 - 199 mg/dL - - - 150  107   LDL (calc) 0 - 99 mg/dL - - - 41  39   HDL-C >39 mg/dL - - - 31  34   Trlycerides 0 - 149 mg/dL - - - 389  211   Hemoglobin A1c 4.8 - 5.6 % 5.5 (NOTE)   The ADA recommends the following therapeutic goal for glycemic   control related to Hgb A1C measurement:   Goal of Therapy:   < 7.0% Hgb A1C   Reference: American Diabetes Association: Clinical Practice   Recommendations 2008, Diabetes Care,  2008, 31:(Suppl 1).  - 7.2  - -  TCO2 0 - 100 mmol/L - 25  - - -     Capillary Blood Glucose: Lab Results  Component Value Date   GLUCAP 119 (H) 08/07/2022   GLUCAP 87 08/05/2022   GLUCAP 151 (H) 07/03/2022   GLUCAP 141 (H) 07/02/2022   GLUCAP 149 (H) 07/02/2022    POCT Glucose     Row Name 08/05/22 1338 08/07/22 1129           POCT Blood Glucose   Pre-Exercise 87 mg/dL --      Pre-Exercise #2 -- 119 mg/dL               Exercise Target Goals: Exercise Program Goal: Individual exercise prescription set using results from initial 6 min walk test and THRR while considering  patient's activity barriers and safety.   Exercise Prescription Goal: Starting with aerobic activity 30 plus minutes a day, 3 days per week for initial exercise prescription. Provide home exercise prescription and guidelines that participant acknowledges understanding prior to discharge.  Activity Barriers & Risk Stratification:  Activity Barriers & Cardiac Risk Stratification - 08/05/22 1256       Activity Barriers & Cardiac Risk Stratification   Activity Barriers Deconditioning    Cardiac Risk Stratification High             6 Minute Walk:  6 Minute Walk     Row Name 08/07/22 1129         6 Minute Walk   Distance 1350 feet     Walk Time 6 minutes     # of Rest Breaks 0     MPH 2.56     METS 3.91     RPE 10     VO2 Peak 13.67     Symptoms No     Resting HR 80 bpm     Resting BP 124/82     Resting Oxygen Saturation  97 %     Exercise Oxygen Saturation  during 6 min walk 97 %     Max Ex. HR 90 bpm     Max Ex. BP 124/80     2 Minute Post BP 118/84              Oxygen Initial Assessment:   Oxygen Re-Evaluation:   Oxygen Discharge (Final Oxygen Re-Evaluation):   Initial Exercise Prescription:  Initial Exercise Prescription - 08/07/22 1100       Date of Initial Exercise RX and Referring Provider   Date 08/07/22    Referring Provider Dr. Tamala Julian    Expected Discharge  Date 10/28/22      Treadmill   MPH 2    Grade 0     Minutes 17      NuStep   Level 1    SPM 80    Minutes 22      Prescription Details   Frequency (times per week) 3    Duration Progress to 30 minutes of continuous aerobic without signs/symptoms of physical distress      Intensity   THRR 40-80% of Max Heartrate 70-139    Ratings of Perceived Exertion 11-13    Perceived Dyspnea 0-4      Resistance Training   Training Prescription Yes    Weight 3    Reps 10-15             Perform Capillary Blood Glucose checks as needed.  Exercise Prescription Changes:   Exercise Prescription Changes     Row Name 08/14/22 1200 08/26/22 1100 08/26/22 1200 09/09/22 1200 09/23/22 1500     Response to Exercise   Blood Pressure (Admit) 136/74 -- 128/74 110/58 118/70   Blood Pressure (Exercise) 142/74 -- 134/68 122/74 138/80   Blood Pressure (Exit) 120/70 -- 116/72 104/68 118/72   Heart Rate (Admit) 95 bpm -- 83 bpm 85 bpm 80 bpm   Heart Rate (Exercise) 119 bpm -- 100 bpm 124 bpm 115 bpm   Heart Rate (Exit) 95 bpm -- 91 bpm 96 bpm 89 bpm   Rating of Perceived Exertion (Exercise) 12 -- '12 12 12   '$ Duration Continue with 30 min of aerobic exercise without signs/symptoms of physical distress. -- Continue with 30 min of aerobic exercise without signs/symptoms of physical distress. Continue with 30 min of aerobic exercise without signs/symptoms of physical distress. Continue with 30 min of aerobic exercise without signs/symptoms of physical distress.   Intensity THRR unchanged -- THRR unchanged THRR unchanged THRR unchanged     Progression   Progression Continue to progress workloads to maintain intensity without signs/symptoms of physical distress. -- Continue to progress workloads to maintain intensity without signs/symptoms of physical distress. Continue to progress workloads to maintain intensity without signs/symptoms of physical distress. Continue to progress workloads to maintain intensity without signs/symptoms of physical distress.      Resistance Training   Training Prescription Yes -- Yes Yes Yes   Weight 3 -- '3 5 4   '$ Reps 10-15 -- 10-15 10-15 10-15   Time 10 Minutes -- 10 Minutes 10 Minutes 10 Minutes     Treadmill   MPH 2 -- 2.8 3 2.8   Grade 0 -- '2 3 3   '$ Minutes 17 -- '17 17 17   '$ METs 2.53 -- 3.91 4.54 4.3     NuStep   Level 2 -- '2 3 2   '$ SPM 96 -- 103 105 110   Minutes 22 -- '22 22 22   '$ METs 2.13 -- 2.37 2.69 2.7     Home Exercise Plan   Plans to continue exercise at -- Home (comment) -- -- --   Frequency -- Add 2 additional days to program exercise sessions. -- -- --   Initial Home Exercises Provided -- 08/26/22 -- -- --            Exercise Comments:   Exercise Comments     Row Name 08/26/22 1126           Exercise Comments home exercise reviewed                Exercise Goals  and Review:   Exercise Goals     Row Name 08/07/22 1131 08/26/22 1215 09/23/22 1535         Exercise Goals   Increase Physical Activity Yes Yes Yes     Intervention Provide advice, education, support and counseling about physical activity/exercise needs.;Develop an individualized exercise prescription for aerobic and resistive training based on initial evaluation findings, risk stratification, comorbidities and participant's personal goals. Provide advice, education, support and counseling about physical activity/exercise needs.;Develop an individualized exercise prescription for aerobic and resistive training based on initial evaluation findings, risk stratification, comorbidities and participant's personal goals. Provide advice, education, support and counseling about physical activity/exercise needs.;Develop an individualized exercise prescription for aerobic and resistive training based on initial evaluation findings, risk stratification, comorbidities and participant's personal goals.     Expected Outcomes Short Term: Attend rehab on a regular basis to increase amount of physical activity.;Long Term: Add in home  exercise to make exercise part of routine and to increase amount of physical activity.;Long Term: Exercising regularly at least 3-5 days a week. Short Term: Attend rehab on a regular basis to increase amount of physical activity.;Long Term: Add in home exercise to make exercise part of routine and to increase amount of physical activity.;Long Term: Exercising regularly at least 3-5 days a week. Short Term: Attend rehab on a regular basis to increase amount of physical activity.;Long Term: Add in home exercise to make exercise part of routine and to increase amount of physical activity.;Long Term: Exercising regularly at least 3-5 days a week.     Increase Strength and Stamina Yes Yes Yes     Intervention Provide advice, education, support and counseling about physical activity/exercise needs.;Develop an individualized exercise prescription for aerobic and resistive training based on initial evaluation findings, risk stratification, comorbidities and participant's personal goals. Provide advice, education, support and counseling about physical activity/exercise needs.;Develop an individualized exercise prescription for aerobic and resistive training based on initial evaluation findings, risk stratification, comorbidities and participant's personal goals. Provide advice, education, support and counseling about physical activity/exercise needs.;Develop an individualized exercise prescription for aerobic and resistive training based on initial evaluation findings, risk stratification, comorbidities and participant's personal goals.     Expected Outcomes Short Term: Increase workloads from initial exercise prescription for resistance, speed, and METs.;Short Term: Perform resistance training exercises routinely during rehab and add in resistance training at home;Long Term: Improve cardiorespiratory fitness, muscular endurance and strength as measured by increased METs and functional capacity (6MWT) Short Term: Increase  workloads from initial exercise prescription for resistance, speed, and METs.;Short Term: Perform resistance training exercises routinely during rehab and add in resistance training at home;Long Term: Improve cardiorespiratory fitness, muscular endurance and strength as measured by increased METs and functional capacity (6MWT) Short Term: Increase workloads from initial exercise prescription for resistance, speed, and METs.;Short Term: Perform resistance training exercises routinely during rehab and add in resistance training at home;Long Term: Improve cardiorespiratory fitness, muscular endurance and strength as measured by increased METs and functional capacity (6MWT)     Able to understand and use rate of perceived exertion (RPE) scale Yes Yes Yes     Intervention Provide education and explanation on how to use RPE scale Provide education and explanation on how to use RPE scale Provide education and explanation on how to use RPE scale     Expected Outcomes Short Term: Able to use RPE daily in rehab to express subjective intensity level;Long Term:  Able to use RPE to guide intensity level when exercising  independently Short Term: Able to use RPE daily in rehab to express subjective intensity level;Long Term:  Able to use RPE to guide intensity level when exercising independently Short Term: Able to use RPE daily in rehab to express subjective intensity level;Long Term:  Able to use RPE to guide intensity level when exercising independently     Knowledge and understanding of Target Heart Rate Range (THRR) Yes Yes Yes     Intervention Provide education and explanation of THRR including how the numbers were predicted and where they are located for reference Provide education and explanation of THRR including how the numbers were predicted and where they are located for reference Provide education and explanation of THRR including how the numbers were predicted and where they are located for reference      Expected Outcomes Short Term: Able to state/look up THRR;Long Term: Able to use THRR to govern intensity when exercising independently;Short Term: Able to use daily as guideline for intensity in rehab Short Term: Able to state/look up THRR;Long Term: Able to use THRR to govern intensity when exercising independently;Short Term: Able to use daily as guideline for intensity in rehab Short Term: Able to state/look up THRR;Long Term: Able to use THRR to govern intensity when exercising independently;Short Term: Able to use daily as guideline for intensity in rehab     Able to check pulse independently Yes Yes Yes     Intervention Provide education and demonstration on how to check pulse in carotid and radial arteries.;Review the importance of being able to check your own pulse for safety during independent exercise Provide education and demonstration on how to check pulse in carotid and radial arteries.;Review the importance of being able to check your own pulse for safety during independent exercise Provide education and demonstration on how to check pulse in carotid and radial arteries.;Review the importance of being able to check your own pulse for safety during independent exercise     Expected Outcomes Short Term: Able to explain why pulse checking is important during independent exercise;Long Term: Able to check pulse independently and accurately Short Term: Able to explain why pulse checking is important during independent exercise;Long Term: Able to check pulse independently and accurately Short Term: Able to explain why pulse checking is important during independent exercise;Long Term: Able to check pulse independently and accurately     Understanding of Exercise Prescription Yes Yes Yes     Intervention Provide education, explanation, and written materials on patient's individual exercise prescription Provide education, explanation, and written materials on patient's individual exercise prescription  Provide education, explanation, and written materials on patient's individual exercise prescription     Expected Outcomes Short Term: Able to explain program exercise prescription;Long Term: Able to explain home exercise prescription to exercise independently Short Term: Able to explain program exercise prescription;Long Term: Able to explain home exercise prescription to exercise independently Short Term: Able to explain program exercise prescription;Long Term: Able to explain home exercise prescription to exercise independently              Exercise Goals Re-Evaluation :  Exercise Goals Re-Evaluation     Row Name 08/26/22 1215 09/23/22 1535           Exercise Goal Re-Evaluation   Exercise Goals Review Increase Physical Activity;Able to understand and use rate of perceived exertion (RPE) scale;Increase Strength and Stamina;Knowledge and understanding of Target Heart Rate Range (THRR);Able to check pulse independently;Understanding of Exercise Prescription Increase Physical Activity;Increase Strength and Stamina;Able to understand and use  rate of perceived exertion (RPE) scale;Knowledge and understanding of Target Heart Rate Range (THRR);Able to check pulse independently;Understanding of Exercise Prescription      Comments Pt has completed 10 sessions of cardiac rehab. She has been able to increase her workloads and she is motivated to progress. She reports that she is walking several days per week outside of rehab, and I reveiwed home exercise guidelines with her today. She is currently exercising at 3.91 METs on the treadmill. Will continue to monitor and progress as able. Pt has completed 21 sessions of cardiac rehab. She continues to increase her workloads. She is motivated during class and enjoys coming. She is walking on her days outside of class. She is currently exercising at 4.30 METs on the treadmill. Will continue to monitor and progress as able.      Expected Outcomes Through exercise  at rehab and at home, the patient will meet their stated goals. Through exercise at rehab and at home, the patient will meet their stated goals.                Discharge Exercise Prescription (Final Exercise Prescription Changes):  Exercise Prescription Changes - 09/23/22 1500       Response to Exercise   Blood Pressure (Admit) 118/70    Blood Pressure (Exercise) 138/80    Blood Pressure (Exit) 118/72    Heart Rate (Admit) 80 bpm    Heart Rate (Exercise) 115 bpm    Heart Rate (Exit) 89 bpm    Rating of Perceived Exertion (Exercise) 12    Duration Continue with 30 min of aerobic exercise without signs/symptoms of physical distress.    Intensity THRR unchanged      Progression   Progression Continue to progress workloads to maintain intensity without signs/symptoms of physical distress.      Resistance Training   Training Prescription Yes    Weight 4    Reps 10-15    Time 10 Minutes      Treadmill   MPH 2.8    Grade 3    Minutes 17    METs 4.3      NuStep   Level 2    SPM 110    Minutes 22    METs 2.7             Nutrition:  Target Goals: Understanding of nutrition guidelines, daily intake of sodium '1500mg'$ , cholesterol '200mg'$ , calories 30% from fat and 7% or less from saturated fats, daily to have 5 or more servings of fruits and vegetables.  Biometrics:  Pre Biometrics - 08/07/22 1132       Pre Biometrics   Height '5\' 3"'$  (1.6 m)    Weight 79 kg    Waist Circumference 40.5 inches    Hip Circumference 41 inches    Waist to Hip Ratio 0.99 %    BMI (Calculated) 30.86    Triceps Skinfold 23 mm    % Body Fat 40.2 %    Grip Strength 22.9 kg    Flexibility 11 in    Single Leg Stand 19.86 seconds              Nutrition Therapy Plan and Nutrition Goals:  Nutrition Therapy & Goals - 08/19/22 0946       Personal Nutrition Goals   Comments Patient scored 64 on her diet assessment. We offer 2 educational sessions on heart healthy nutrition with  handouts and assistance with RD referral if patient is interested.  Intervention Plan   Intervention Nutrition handout(s) given to patient.    Expected Outcomes Short Term Goal: Understand basic principles of dietary content, such as calories, fat, sodium, cholesterol and nutrients.             Nutrition Assessments:  Nutrition Assessments - 08/05/22 1259       MEDFICTS Scores   Pre Score 64            MEDIFICTS Score Key: ?70 Need to make dietary changes  40-70 Heart Healthy Diet ? 40 Therapeutic Level Cholesterol Diet   Picture Your Plate Scores: <27 Unhealthy dietary pattern with much room for improvement. 41-50 Dietary pattern unlikely to meet recommendations for good health and room for improvement. 51-60 More healthful dietary pattern, with some room for improvement.  >60 Healthy dietary pattern, although there may be some specific behaviors that could be improved.    Nutrition Goals Re-Evaluation:   Nutrition Goals Discharge (Final Nutrition Goals Re-Evaluation):   Psychosocial: Target Goals: Acknowledge presence or absence of significant depression and/or stress, maximize coping skills, provide positive support system. Participant is able to verbalize types and ability to use techniques and skills needed for reducing stress and depression.  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 Yes      Barriers   Psychosocial barriers to participate in program There are no identifiable barriers or psychosocial needs.      Screening Interventions   Interventions Encouraged to exercise    Expected Outcomes Long Term goal: The participant improves quality of Life and  PHQ9 Scores as seen by post scores and/or verbalization of changes;Short Term goal: Identification and review with participant of any Quality of Life or Depression concerns found by scoring the questionnaire.             Quality of Life Scores:  Quality of Life - 08/05/22 1347       Quality of Life   Select Quality of Life      Quality of Life Scores   Health/Function Pre 19.2 %    Socioeconomic Pre 19.86 %    Psych/Spiritual Pre 19.42 %    Family Pre 20.1 %    GLOBAL Pre 19.52 %            Scores of 19 and below usually indicate a poorer quality of life in these areas.  A difference of  2-3 points is a clinically meaningful difference.  A difference of 2-3 points in the total score of the Quality of Life Index has been associated with significant improvement in overall quality of life, self-image, physical symptoms, and general health in studies assessing change in quality of life.  PHQ-9: Review Flowsheet       08/05/2022 07/21/2014  Depression screen PHQ 2/9  Decreased Interest 0 0  Down, Depressed, Hopeless 1 0  PHQ - 2 Score 1 0  Altered sleeping 1 -  Tired, decreased energy 2 -  Change in appetite 2 -  Feeling bad or failure about yourself  1 -  Trouble concentrating 0 -  Moving slowly or fidgety/restless 0 -  Suicidal thoughts 0 -  PHQ-9 Score 7 -  Difficult doing work/chores Somewhat difficult -   Interpretation of Total Score  Total Score Depression Severity:  1-4 = Minimal depression, 5-9 = Mild depression, 10-14 = Moderate depression, 15-19 = Moderately severe depression, 20-27 = Severe depression   Psychosocial Evaluation and Intervention:  Psychosocial Evaluation - 08/05/22 1347       Psychosocial Evaluation & Interventions   Interventions Encouraged to exercise with the program and follow exercise prescription    Comments Pt has no barriers to participating in CR. She does report sleep and stress concerns, but she denies anxiety and depression. She  reports having trouble staying asleep and that she frequently wakes up in the night. She does take melatonin or benadryl occassionaly. Her stress concerns are due to work. 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 She scored a 7 on her PHQ-9. She reports that her energy levels have been low since her stent, and this also causes her to feel down. She reports that she has a good support system with her husband. Her goals in the program are to learn more about heart disease and to learn more about nutrition. She is eager to start the program and will be coming during her lunch break from work.    Expected Outcomes Pt's stressors will be reduced and she will have no other identifiable psychosocial issues.    Continue Psychosocial Services  No Follow up required             Psychosocial Re-Evaluation:  Psychosocial Re-Evaluation     [place] Name 08/19/22 0946 09/16/22 1221           Psychosocial Re-Evaluation   Current issues with Current Sleep Concerns;Current Stress Concerns Current Sleep Concerns;Current Stress Concerns      Comments Patient is new to the program completing 6 sessions. She continues to have no psychosocial barriers identified. She continues to use Melatonin and Benadryl for sleep. She feels she is able to manage the stress from her job. She seems to enjoy the sessions and demonstrates an intertest in improving her health. We will continue to monitor. Patient has completed 17 sessions. She continues to have no psychosocial barriers identified. She continues to use Melatonin and Benadryl for sleep. She continues to  feel like she is able to manage the stress from her job. She continues to enjoy the sessions and demonstrates an intertest in improving her health. We will continue to monitor.      Expected Outcomes Patient will continue to have no psychosocial barriers  identified. Patient will continue to have no psychosocial barriers identified.      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 She continues  to have no psychosocial barriers identified. She continues to use Melatonin and Benadryl for sleep. She continues to  feel like she is able to manage the stress from her job. She continues to enjoy the sessions and demonstrates an intertest in improving her health. We will continue to monitor.    Expected Outcomes Patient will continue to have no psychosocial barriers identified.    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   Vocational Rehab Evaluation & Intervention:  Vocational Rehab - 08/05/22 1307       Initial Vocational Rehab Evaluation & Intervention   Assessment shows need for Vocational Rehabilitation No      Vocational Rehab Re-Evaulation   Comments Already returned to work              Education: Education Goals: Education classes will be provided on a weekly basis, covering required topics. Participant will state understanding/return demonstration of topics presented.  Learning Barriers/Preferences:  Learning Barriers/Preferences - 08/05/22 1305       Learning Barriers/Preferences   Learning Barriers None    Learning Preferences Audio;Computer/Internet;Group Instruction;Individual Instruction;Pictoral;Skilled Demonstration;Verbal Instruction;Video;Written Material             Education Topics: Hypertension, Hypertension Reduction -Define heart disease and high blood pressure. Discus how high blood pressure affects the body and ways to reduce high blood pressure. Flowsheet Row CARDIAC REHAB PHASE II EXERCISE from 09/18/2022 in [place]  Date 08/21/22  Educator [place]  Instruction Review Code 1- Verbalizes Understanding       Exercise and Your Heart -Discuss why it is important to exercise, the FITT principles of exercise, normal and abnormal responses to exercise, and how to exercise safely. Flowsheet Row CARDIAC REHAB PHASE II EXERCISE from 09/18/2022 in [place]  Date 08/28/22  Educator [place]  Instruction Review Code 1- Verbalizes Understanding       Angina -Discuss definition of angina, causes of angina, treatment of angina, and how to decrease risk of having angina. Flowsheet Row CARDIAC REHAB PHASE II EXERCISE from 09/18/2022 in [place]  Date 09/06/22  Educator DF  Instruction Review Code 2- Demonstrated Understanding       Cardiac Medications -Review what the following cardiac medications are used for, how they affect the body, and side effects that may occur when taking the medications.  Medications include Aspirin, Beta blockers, calcium channel blockers, ACE Inhibitors, angiotensin receptor blockers, diuretics, digoxin, and antihyperlipidemics. Flowsheet Row CARDIAC  REHAB PHASE II EXERCISE from 09/18/2022 in [place]  Date 09/11/22  Educator HB  Instruction Review Code 1- Verbalizes Understanding       Congestive Heart Failure -Discuss the definition of CHF, how to live with CHF, the signs and symptoms of CHF, and how keep track of weight and sodium intake. Flowsheet Row CARDIAC REHAB PHASE II EXERCISE from 09/18/2022 in [place]  Date 09/18/22  Educator HB  Instruction Review Code 1- Verbalizes Understanding       Heart Disease and Intimacy -Discus the effect sexual activity has on the heart, how changes occur during intimacy as we age, and safety during sexual activity.   Smoking Cessation /  COPD -Discuss different methods to quit smoking, the health benefits of quitting smoking, and the definition of COPD.   Nutrition I: Fats -Discuss the types of cholesterol, what cholesterol does to the heart, and how cholesterol levels can be controlled.   Nutrition II: Labels -Discuss the different components of food labels and how to read food label   Heart Parts/Heart Disease and PAD -Discuss the anatomy of the heart, the pathway of blood circulation through the heart, and these are affected by heart disease.   Stress I: Signs and Symptoms -Discuss the causes of stress, how stress may lead to anxiety and depression, and ways to limit stress. Flowsheet Row CARDIAC REHAB PHASE II EXERCISE from 09/18/2022 in [place]  Date 08/07/22  Educator DF  Instruction Review Code 1- Verbalizes Understanding       Stress II: Relaxation -Discuss different types of relaxation techniques to limit stress. Flowsheet Row CARDIAC REHAB PHASE II EXERCISE from 09/18/2022 in [place]  Date 08/14/22  Educator hj  Instruction Review Code 1- Verbalizes Understanding       Warning Signs of Stroke / TIA -Discuss definition of a stroke, what the signs and symptoms  are of a stroke, and how to identify when someone is having stroke.   Knowledge Questionnaire Score:  Knowledge Questionnaire Score - 08/05/22 1305       Knowledge Questionnaire Score   Pre Score 23/24             Core Components/Risk Factors/Patient Goals at Admission:  Personal Goals and Risk Factors at Admission - 08/05/22 1308       Core Components/Risk Factors/Patient Goals on Admission   Diabetes Yes    Intervention Provide education about signs/symptoms and action to take for hypo/hyperglycemia.;Provide education about proper nutrition, including hydration, and aerobic/resistive exercise prescription along with prescribed medications to achieve blood glucose in normal ranges: Fasting glucose 65-99 mg/dL    Expected Outcomes Short Term: Participant verbalizes understanding of the signs/symptoms and immediate care of hyper/hypoglycemia, proper foot care and importance of medication, aerobic/resistive exercise and nutrition plan for blood glucose control.;Long Term: Attainment of HbA1C < 7%.    Hypertension Yes    Intervention Provide education on lifestyle modifcations including regular physical activity/exercise, weight management, moderate sodium restriction and increased consumption of fresh fruit, vegetables, and low fat dairy, alcohol moderation, and smoking cessation.;Monitor prescription use compliance.    Expected Outcomes Short Term: Continued assessment and intervention until BP is < 140/66m HG in hypertensive participants. < 130/897mHG in hypertensive participants with diabetes, heart failure or chronic kidney disease.;Long Term: Maintenance of blood pressure at goal levels.    Lipids Yes    Intervention Provide education and support for participant on nutrition & aerobic/resistive exercise along with prescribed medications to achieve LDL '70mg'$ , HDL >'40mg'$ .    Expected Outcomes Short Term: Participant states understanding of desired cholesterol values and is compliant  with medications prescribed. Participant is following exercise prescription and nutrition guidelines.;Long Term: Cholesterol controlled with medications as prescribed, with individualized exercise RX and with personalized nutrition plan. Value goals: LDL < '70mg'$ , HDL > 40 mg.    Personal Goal Other Yes    Personal Goal Learn about diet and heart disease.    Intervention Attend cardiac rehab education sessions.    Expected Outcomes Pt will meet stated goals.             Core Components/Risk Factors/Patient Goals Review:   Goals and Risk Factor Review  [place] Name 08/19/22 6967 09/16/22 1222           Core Components/Risk Factors/Patient Goals Review   Personal Goals Review Hypertension;Diabetes;Lipids;Other Hypertension;Diabetes;Lipids;Other      Review Patient was referred to [facility] with Stent placement. She has multiple risk factors for CAD and is participating in the program for risk modification. She has completed 6 sessions. Hercurrent weight is 174.4 lbs maintained since her initial visit. She is doing well in the program with progressions and consistent attendance. Her blood pressure is at goal. Her DM is managed by Actos, Ozempic, Metformin, Jardiance and glimperide. No recent A1C on file. Her personal goals for the program are to learn about nutrition and heart disease. We will continue to monitor her progress as she works towards meeting these goals. Patient  has completed 17 sessions. Her current weight is 175.1 lbs up 0.7 lbs from last 30 day review. She continues to do well in the program with progressions and consistent attendance. Her blood pressure continues to be at goal. Her DM is managed by Actos, Ozempic, Metformin, Jardiance and glimperide. No recent A1C on file. Her personal goals for the program continue to be to learn about nutrition and heart disease. We will continue to monitor her progress as she works towards meeting these goals.      Expected Outcomes Patient will  complete the program meeting both personal and program goals. Patient will complete the program meeting both personal and program goals.               Core Components/Risk Factors/Patient Goals at Discharge (Final Review):   Goals and Risk Factor Review - 09/16/22 1222       Core Components/Risk Factors/Patient Goals Review   Personal Goals Review Hypertension;Diabetes;Lipids;Other    Review Patient  has completed 17 sessions. Her current weight is 175.1 lbs up 0.7 lbs from last 30 day review. She continues to do well in the program with progressions and consistent attendance. Her blood pressure continues to be at goal. Her DM is managed by Actos, Ozempic, Metformin, Jardiance and glimperide. No recent A1C on file. Her personal goals for the program continue to be to learn about nutrition and heart disease. We will continue to monitor her progress as she works towards meeting these goals.    Expected Outcomes Patient will complete the program meeting both personal and program goals.             ITP Comments:   Comments: ITP REVIEW Pt is making expected progress toward Cardiac Rehab goals after completing 21 sessions. Recommend continued exercise, life style modification, education, and increased stamina and strength.

## 2022-09-27 ENCOUNTER — Encounter (HOSPITAL_COMMUNITY): Payer: Commercial Managed Care - PPO

## 2022-09-30 ENCOUNTER — Encounter (HOSPITAL_COMMUNITY)
Admission: RE | Admit: 2022-09-30 | Discharge: 2022-09-30 | Disposition: A | Discharge status: Home / Self Care | Payer: Commercial Managed Care - PPO | Source: Ambulatory Visit | Attending: Internal Medicine | Admitting: Internal Medicine

## 2022-09-30 DIAGNOSIS — Z955 Presence of coronary angioplasty implant and graft: Secondary | ICD-10-CM | POA: Diagnosis not present

## 2022-09-30 NOTE — Progress Notes (Signed)
Daily Session Note  Patient Details  Name: Carla Knight MRN: [id number] Date of Birth: November 27, 1975 Referring Provider:   Flowsheet Row CARDIAC REHAB PHASE II EXERCISE from 08/07/2022 in [place]  Referring Provider Dr. Tamala Julian       Encounter Date: 09/30/2022  Check In:  Session Check In - 09/30/22 1100       Check-In   Supervising physician immediately available to respond to emergencies [facility] MD immediately available    Physician(s) Dr Harl Bowie    Location [facility]    Staff Present Leana Roe, BS, Exercise Physiologist;Jouri Threat Hassell Done, RN, BSN    Virtual Visit No    Medication changes reported     No    Fall or balance concerns reported    No    Tobacco Cessation No Change    Warm-up and Cool-down Performed as group-led instruction    Resistance Training Performed Yes    VAD Patient? No    PAD/SET Patient? No      Pain Assessment   Currently in Pain? No/denies    Multiple Pain Sites No             Capillary Blood Glucose: No results found for this or any previous visit (from the past 24 hour(s)).    Social History   Tobacco Use  Smoking Status Former   Packs/day: 0.25   Years: 15.00   Total pack years: 3.75   Types: Cigarettes   Quit date: 07/02/2022   Years since quitting: 0.2  Smokeless Tobacco Never  Tobacco Comments   "im a casual mosker, every now and then"     Goals Met:  Independence with exercise equipment Exercise tolerated well No report of concerns or symptoms today Strength training completed today  Goals Unmet:  Not Applicable  Comments: checkout tat 1200.   Dr. Carlyle Dolly is Medical Director for [facility] Cardiac Rehab

## 2022-10-01 ENCOUNTER — Ambulatory Visit: Payer: Commercial Managed Care - PPO | Attending: Nurse Practitioner | Admitting: Nurse Practitioner

## 2022-10-01 ENCOUNTER — Encounter: Payer: Self-pay | Admitting: Nurse Practitioner

## 2022-10-01 VITALS — BP 128/76 | HR 74 | Ht 63.0 in | Wt 175.0 lb

## 2022-10-01 DIAGNOSIS — E785 Hyperlipidemia, unspecified: Secondary | ICD-10-CM

## 2022-10-01 DIAGNOSIS — Z79899 Other long term (current) drug therapy: Secondary | ICD-10-CM

## 2022-10-01 DIAGNOSIS — Z794 Long term (current) use of insulin: Secondary | ICD-10-CM

## 2022-10-01 DIAGNOSIS — I10 Essential (primary) hypertension: Secondary | ICD-10-CM

## 2022-10-01 DIAGNOSIS — I2510 Atherosclerotic heart disease of native coronary artery without angina pectoris: Secondary | ICD-10-CM | POA: Diagnosis not present

## 2022-10-01 DIAGNOSIS — E890 Postprocedural hypothyroidism: Secondary | ICD-10-CM

## 2022-10-01 DIAGNOSIS — E1159 Type 2 diabetes mellitus with other circulatory complications: Secondary | ICD-10-CM

## 2022-10-01 LAB — CBC
Hematocrit: 42.6 % (ref 34.0–46.6)
Hemoglobin: 13.7 g/dL (ref 11.1–15.9)
MCH: 26.9 pg (ref 26.6–33.0)
MCHC: 32.2 g/dL (ref 31.5–35.7)
MCV: 84 fL (ref 79–97)
Platelets: 404 10*3/uL (ref 150–450)
RBC: 5.09 x10E6/uL (ref 3.77–5.28)
RDW: 14.5 % (ref 11.7–15.4)
WBC: 12.3 10*3/uL — ABNORMAL HIGH (ref 3.4–10.8)

## 2022-10-01 NOTE — Patient Instructions (Signed)
Medication Instructions:  Your physician recommends that you continue on your current medications as directed. Please refer to the Current Medication list given to you today.   *If you need a refill on your cardiac medications before your next appointment, please call your pharmacy*   Lab Work: Your physician recommends that you complete lab work today. CBC  If you have labs (blood work) drawn today and your tests are completely normal, you will receive your results only by: [place] (if you have MyChart) OR A paper copy in the mail If you have any lab test that is abnormal or we need to change your treatment, we will call you to review the results.   Testing/Procedures: NONE ordered at this time of appointment     Follow-Up: At [facility], you and your health needs are our priority.  As part of our continuing mission to provide you with exceptional heart care, we have created designated Provider Care Teams.  These Care Teams include your primary Cardiologist (physician) and Advanced Practice Providers (APPs -  Physician Assistants and Nurse Practitioners) who all work together to provide you with the care you need, when you need it.  We recommend signing up for the patient portal called "MyChart".  Sign up information is provided on this After Visit Summary.  MyChart is used to connect with patients for Virtual Visits (Telemedicine).  Patients are able to view lab/test results, encounter notes, upcoming appointments, etc.  Non-urgent messages can be sent to your provider as well.   To learn more about what you can do with MyChart, go to [email or web address].    Your next appointment:   5-6 month(s)  The format for your next appointment:   In Person  Provider:   Janina Mayo, MD     Other Instructions   Important Information About Sugar

## 2022-10-01 NOTE — Progress Notes (Signed)
Office Visit    Patient Name: Carla Knight Date of Encounter: 10/01/2022  Primary Care Provider:  Asencion Noble, MD Primary Cardiologist:  Janina Mayo, MD  Chief Complaint    46 year old female with a history of CAD s/p DES-LAD in 06/2022, hypertension, hyperlipidemia, type 2 diabetes, and papillary thyroid cancer s/p total thyroidectomy who presents for follow-up related to CAD.   Past Medical History    Past Medical History:  Diagnosis Date   Diabetes mellitus    NIDDM, type 2    Dyslipidemia    Goiter    no current meds.   Hypertension    under control; has been on med. x 5-6 yrs.   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   PONV (postoperative nausea and vomiting)    nauseated when ate after surgery   Thyroid mass    Past Surgical History:  Procedure Laterality Date   ABLATION  2016 or 2017 unsure   obgyn dr Pamala Hurry    CESAREAN SECTION  10/18/2008   CESAREAN SECTION W/BTL  04/08/2011   CORONARY STENT INTERVENTION N/A 07/02/2022   Procedure: CORONARY STENT INTERVENTION;  Surgeon: Belva Crome, MD;  Location: [place] [facility];  Service: Cardiovascular;  Laterality: N/A;   LAPAROSCOPIC CHOLECYSTECTOMY  06/05/2009   LEFT HEART CATH AND CORONARY ANGIOGRAPHY N/A 07/02/2022   Procedure: LEFT HEART CATH AND CORONARY ANGIOGRAPHY;  Surgeon: Belva Crome, MD;  Location: [place] [facility];  Service: Cardiovascular;  Laterality: N/A;   PAROTIDECTOMY  12/17/2011   Procedure: PAROTIDECTOMY;  Surgeon: Ascencion Dike, MD;  Location: [facility];  Service: ENT;  Laterality: Right;  Total right parotidectomy   ROBOTIC ASSISTED LAPAROSCOPIC HYSTERECTOMY AND SALPINGECTOMY Bilateral 05/01/2018   Procedure: XI ROBOTIC ASSISTED LAPAROSCOPIC HYSTERECTOMY AND SALPINGECTOMY;  Surgeon: Aloha Gell, MD;  Location: [facility];  Service: Gynecology;  Laterality:  Bilateral;  Requests 3 1/2 hrs.   ROBOTIC ASSISTED LAPAROSCOPIC LYSIS OF ADHESION  05/01/2018   Procedure: XI ROBOTIC ASSISTED LAPAROSCOPIC LYSIS OF ADHESION;  Surgeon: Aloha Gell, MD;  Location: [facility];  Service: Gynecology;;   THYROIDECTOMY  05/13/2012   Procedure: THYROIDECTOMY;  Surgeon: Ascencion Dike, MD;  [facility];  Service: ENT;  Laterality: Bilateral;  TOTAL THYROIDECTOMY    Allergies  Allergies  Allergen Reactions   Adhesive [Tape] Hives    History of Present Illness    46 year old female with a history of CAD s/p DES-LAD in 06/2022, hypertension, hyperlipidemia, type 2 diabetes, papillary thyroid cancer s/p total thyroidectomy.   She was referred to cardiology in 05/2022 following an ED visit for chest pain.  ACS was ruled out in the ED. Coronary CTA showed coronary calcium score of 75 (99th percentile), significant lesion in proximal LAD. She underwent cardiac catheterization on 07/02/2022 which revealed 90% p-mLAD stenosis s/p DES, otherwise normal coronary arteries, EF > 65%.  She was started on aspirin and Brilinta.  Atorvastatin was increased from 20 mg daily to 80 mg daily.  She was started on Vascepa.  HCTZ was discontinued and she was started on Toprol-XL 25 mg daily.  She was observed overnight and discharged home in stable condition the following day.  She was last seen in the office on 07/12/2022 and was stable from a cardiac standpoint.  She did note some mild fatigue and mild diarrhea.  She questioned whether  or not this could be related to side effects from new medication.    She presents today for follow-up.  Since her last visit she has done well from a cardiac standpoint.  She no longer reports diarrhea.  She does note occasional fleeting chest discomfort, dissimilar to prior angina equivalent, she denies any exertional symptoms concerning for angina.  She is participating in cardiac rehab.  She does note that she was shaving the other day and nicked herself with a  razor and bled significantly.  Otherwise, she reports feeling well.  Home Medications    Current Outpatient Medications  Medication Sig Dispense Refill   acetaminophen (TYLENOL) 500 MG tablet Take 500 mg by mouth every 8 (eight) hours as needed for headache or moderate pain.     aspirin EC 81 MG tablet Take 1 tablet (81 mg total) by mouth daily. Swallow whole. 90 tablet 3   atorvastatin (LIPITOR) 80 MG tablet Take 1 tablet (80 mg total) by mouth every evening. 90 tablet 3   glimepiride (AMARYL) 2 MG tablet Take 2 mg by mouth daily with breakfast.     icosapent Ethyl (VASCEPA) 1 g capsule Take 2 capsules (2 g total) by mouth 2 (two) times daily. 120 capsule 11   JARDIANCE 25 MG TABS tablet Take 25 mg by mouth daily.     ketotifen (ZADITOR) 0.025 % ophthalmic solution Place 1 drop into both eyes daily as needed (allergies).     lisinopril (PRINIVIL,ZESTRIL) 10 MG tablet Take 10 mg by mouth every morning.      metFORMIN (GLUCOPHAGE) 1000 MG tablet Take 1 tablet (1,000 mg total) by mouth 2 (two) times daily.     metoprolol succinate (TOPROL XL) 25 MG 24 hr tablet Take 1 tablet (25 mg total) by mouth daily. 30 tablet 11   nitroGLYCERIN (NITROSTAT) 0.4 MG SL tablet Place 1 tablet (0.4 mg total) under the tongue every 5 (five) minutes as needed for chest pain. 25 tablet 0   OZEMPIC, 0.25 OR 0.5 MG/DOSE, 2 MG/3ML SOPN Inject 0.5 mg into the skin every Sunday.     Pediatric Multivit-Minerals-C (CHILDRENS GUMMIES) CHEW Chew 2 tablets by mouth every evening.     pioglitazone (ACTOS) 15 MG tablet Take 15 mg by mouth daily.     SYNTHROID 150 MCG tablet Take 1 tablet (150 mcg total) by mouth daily before breakfast. 90 tablet 3   ticagrelor (BRILINTA) 90 MG TABS tablet Take 1 tablet (90 mg total) by mouth 2 (two) times daily. 60 tablet 11   No current facility-administered medications for this visit.     Review of Systems   She denies chest pain, palpitations, dyspnea, pnd, orthopnea, n, v, dizziness,  syncope, edema, weight gain, or early satiety. All other systems reviewed and are otherwise negative except as noted above.   Physical Exam    VS:  BP 128/76 (BP Location: Left Arm, Patient Position: Sitting, Cuff Size: Normal)   Pulse 74   Ht '5\' 3"'$  (1.6 m)   Wt 175 lb (79.4 kg)   BMI 31.00 kg/m  GEN: Well nourished, well developed, in no acute distress. HEENT: normal. Neck: Supple, no JVD, carotid bruits, or masses. Cardiac: RRR, no murmurs, rubs, or gallops. No clubbing, cyanosis, edema.  Radials/DP/PT 2+ and equal bilaterally.  Respiratory:  Respirations regular and unlabored, clear to auscultation bilaterally. GI: Soft, nontender, nondistended, BS + x 4. MS: no deformity or atrophy. Skin: warm and dry, no rash. Neuro:  Strength and sensation are intact.  Psych: Normal affect.  Accessory Clinical Findings    ECG personally reviewed by me today -no EKG in office today.   Lab Results  Component Value Date   WBC 10.2 07/03/2022   HGB 15.3 (H) 07/03/2022   HCT 46.7 (H) 07/03/2022   MCV 85.8 07/03/2022   PLT 361 07/03/2022   Lab Results  Component Value Date   CREATININE 0.64 07/12/2022   BUN 7 07/12/2022   NA 142 07/12/2022   K 4.0 07/12/2022   CL 105 07/12/2022   CO2 17 (L) 07/12/2022   Lab Results  Component Value Date   ALT 16 08/30/2022   AST 13 08/30/2022   ALKPHOS 103 08/30/2022   BILITOT 0.6 08/30/2022   Lab Results  Component Value Date   CHOL 107 08/30/2022   HDL 34 (L) 08/30/2022   LDLCALC 39 08/30/2022   TRIG 211 (H) 08/30/2022   CHOLHDL 3.1 08/30/2022    Lab Results  Component Value Date   HGBA1C 7.2 (H) 04/29/2018    Assessment & Plan    1. CAD: S/p DES-LAD in 06/2022, otherwise normal coronary arteries. Stable with no anginal symptoms. No indication for ischemic evaluation.  She is participating in cardiac rehab.  She notes that she had significant bleeding after nicking herself while shaving.  Will check CBC today.  Continue aspirin,  Brilinta, lisinopril, metoprolol, Vascepa, and Lipitor.   2. Hypertension: BP well controlled. Continue current antihypertensive regimen.   3. Hyperlipidemia:  LDL was 9 in 08/2022.  Triglycerides were elevated at 211, however, this was much improved since the addition of Vascepa. Continue Lipitor, Vascepa.   4. Type 2 diabetes: A1c was 7.3 in 04/2022.  Monitored and managed per PCP.  Continue Ozempic, Jardiance.   5. Hypothyroidism: TSH was 0.430 in 04/2022.  Follows with endocrinology.    6. Disposition: Follow-up in 5-6 months.      Lenna Sciara, NP 10/01/2022, 12:27 PM

## 2022-10-02 ENCOUNTER — Encounter (HOSPITAL_COMMUNITY)
Admission: RE | Admit: 2022-10-02 | Discharge: 2022-10-02 | Disposition: A | Discharge status: Home / Self Care | Payer: Commercial Managed Care - PPO | Source: Ambulatory Visit | Attending: Internal Medicine | Admitting: Internal Medicine

## 2022-10-02 DIAGNOSIS — Z955 Presence of coronary angioplasty implant and graft: Secondary | ICD-10-CM

## 2022-10-02 NOTE — Progress Notes (Signed)
Daily Session Note  Patient Details  Name: Carla Knight MRN: [id number] Date of Birth: 1976-07-17 Referring Provider:   Flowsheet Row CARDIAC REHAB PHASE II EXERCISE from 08/07/2022 in [place]  Referring Provider Dr. Tamala Julian       Encounter Date: 10/02/2022  Check In:  Session Check In - 10/02/22 1100       Check-In   Supervising physician immediately available to respond to emergencies [facility] MD immediately available    Physician(s) Dr Harl Bowie    Location [facility]    Staff Present Leana Roe, BS, Exercise Physiologist;Dalton Sherrie George, MS, ACSM-CEP    Virtual Visit No    Medication changes reported     No    Fall or balance concerns reported    No    Tobacco Cessation No Change    Warm-up and Cool-down Performed as group-led instruction    Resistance Training Performed Yes    VAD Patient? No    PAD/SET Patient? No      Pain Assessment   Currently in Pain? No/denies    Multiple Pain Sites No             Capillary Blood Glucose: No results found for this or any previous visit (from the past 24 hour(s)).    Social History   Tobacco Use  Smoking Status Former   Packs/day: 0.25   Years: 15.00   Total pack years: 3.75   Types: Cigarettes   Quit date: 07/02/2022   Years since quitting: 0.2  Smokeless Tobacco Never  Tobacco Comments   "im a casual mosker, every now and then"     Goals Met:  Independence with exercise equipment Exercise tolerated well No report of concerns or symptoms today Strength training completed today  Goals Unmet:  Not Applicable  Comments: check out 1200   Dr. Carlyle Dolly is Medical Director for [place]

## 2022-10-04 ENCOUNTER — Encounter (HOSPITAL_COMMUNITY)
Admission: RE | Admit: 2022-10-04 | Discharge: 2022-10-04 | Disposition: A | Discharge status: Home / Self Care | Payer: Commercial Managed Care - PPO | Source: Ambulatory Visit | Attending: Internal Medicine | Admitting: Internal Medicine

## 2022-10-04 DIAGNOSIS — Z955 Presence of coronary angioplasty implant and graft: Secondary | ICD-10-CM

## 2022-10-04 NOTE — Progress Notes (Signed)
Daily Session Note  Patient Details  Name: Carla Knight MRN: [id number] Date of Birth: May 03, 1976 Referring Provider:   Flowsheet Row CARDIAC REHAB PHASE II EXERCISE from 08/07/2022 in [place]  Referring Provider Dr. Tamala Julian       Encounter Date: 10/04/2022  Check In:  Session Check In - 10/04/22 1058       Check-In   Supervising physician immediately available to respond to emergencies [facility] immediately available    Physician(s) Dr Harl Bowie    Location [facility]    Staff Present Leana Roe, BS, Exercise Physiologist;Bates Collington, RN;Daphyne Hassell Done, RN, Jennye Moccasin, RN, BSN    Virtual Visit No    Medication changes reported     No    Fall or balance concerns reported    No    Tobacco Cessation No Change    Warm-up and Cool-down Performed as group-led instruction    Resistance Training Performed Yes    VAD Patient? No    PAD/SET Patient? No      Pain Assessment   Currently in Pain? No/denies    Multiple Pain Sites No             Capillary Blood Glucose: No results found for this or any previous visit (from the past 24 hour(s)).    Social History   Tobacco Use  Smoking Status Former   Packs/day: 0.25   Years: 15.00   Total pack years: 3.75   Types: Cigarettes   Quit date: 07/02/2022   Years since quitting: 0.2  Smokeless Tobacco Never  Tobacco Comments   "im a casual mosker, every now and then"     Goals Met:  Independence with exercise equipment Exercise tolerated well No report of concerns or symptoms today Strength training completed today  Goals Unmet:  Not Applicable  Comments: checkout @ 11am   Dr. Carlyle Dolly is Medical Director for [place]

## 2022-10-07 ENCOUNTER — Telehealth: Payer: Self-pay

## 2022-10-07 ENCOUNTER — Encounter (HOSPITAL_COMMUNITY)
Admission: RE | Admit: 2022-10-07 | Discharge: 2022-10-07 | Disposition: A | Discharge status: Home / Self Care | Payer: Commercial Managed Care - PPO | Source: Ambulatory Visit | Attending: Internal Medicine | Admitting: Internal Medicine

## 2022-10-07 VITALS — Wt 173.7 lb

## 2022-10-07 DIAGNOSIS — Z955 Presence of coronary angioplasty implant and graft: Secondary | ICD-10-CM | POA: Diagnosis not present

## 2022-10-07 NOTE — Progress Notes (Signed)
Daily Session Note  Patient Details  Name: JADEE GOLEBIEWSKI MRN: [id number] Date of Birth: Nov 11, 1976 Referring Provider:   Flowsheet Row CARDIAC REHAB PHASE II EXERCISE from 08/07/2022 in [place]  Referring Provider Dr. Tamala Julian       Encounter Date: 10/07/2022  Check In:  Session Check In - 10/07/22 1100       Check-In   Supervising physician immediately available to respond to emergencies [facility] immediately available    Physician(s) Dr. Domenic Polite    Location [facility]    Staff Present Leana Roe, BS, Exercise Physiologist;Lilliana Turner Wynetta Emery, RN, Joanette Gula, RN, BSN    Virtual Visit No    Medication changes reported     No    Fall or balance concerns reported    No    Tobacco Cessation No Change    Warm-up and Cool-down Performed as group-led instruction    Resistance Training Performed Yes    VAD Patient? No    PAD/SET Patient? No      Pain Assessment   Currently in Pain? No/denies    Multiple Pain Sites No             Capillary Blood Glucose: No results found for this or any previous visit (from the past 24 hour(s)).    Social History   Tobacco Use  Smoking Status Former   Packs/day: 0.25   Years: 15.00   Total pack years: 3.75   Types: Cigarettes   Quit date: 07/02/2022   Years since quitting: 0.2  Smokeless Tobacco Never  Tobacco Comments   "im a casual mosker, every now and then"     Goals Met:  Independence with exercise equipment Exercise tolerated well No report of concerns or symptoms today Strength training completed today  Goals Unmet:  Not Applicable  Comments: Check out 1200.   Dr. Carlyle Dolly is Medical Director for [facility]

## 2022-10-07 NOTE — Telephone Encounter (Signed)
Spoke with pt. Pt was notified of lab results and will follow up as planned.  

## 2022-10-09 ENCOUNTER — Encounter (HOSPITAL_COMMUNITY): Payer: Commercial Managed Care - PPO

## 2022-10-11 ENCOUNTER — Encounter (HOSPITAL_COMMUNITY): Payer: Commercial Managed Care - PPO

## 2022-10-14 ENCOUNTER — Encounter (HOSPITAL_COMMUNITY)
Admission: RE | Admit: 2022-10-14 | Discharge: 2022-10-14 | Disposition: A | Discharge status: Home / Self Care | Payer: Commercial Managed Care - PPO | Source: Ambulatory Visit | Attending: Internal Medicine | Admitting: Internal Medicine

## 2022-10-14 DIAGNOSIS — Z955 Presence of coronary angioplasty implant and graft: Secondary | ICD-10-CM

## 2022-10-14 NOTE — Progress Notes (Signed)
Daily Session Note  Patient Details  Name: CYDNI REDDOCH MRN: [id number] Date of Birth: 07/07/1976 Referring Provider:   Flowsheet Row CARDIAC REHAB PHASE II EXERCISE from 08/07/2022 in [place]  Referring Provider Dr. Tamala Julian       Encounter Date: 10/14/2022  Check In:  Session Check In - 10/14/22 1100       Check-In   Supervising physician immediately available to respond to emergencies [facility] immediately available    Physician(s) Dr Dellia Cloud    Location [facility]    Staff Present Leana Roe, BS, Exercise Physiologist;Nghia Mcentee Hassell Done, RN, BSN;Dalton Kris Mouton MHA, MS, ACSM-CEP    Virtual Visit No    Medication changes reported     No    Fall or balance concerns reported    No    Tobacco Cessation No Change    Warm-up and Cool-down Performed as group-led instruction    Resistance Training Performed Yes    VAD Patient? No    PAD/SET Patient? No      Pain Assessment   Currently in Pain? No/denies    Multiple Pain Sites No             Capillary Blood Glucose: No results found for this or any previous visit (from the past 24 hour(s)).    Social History   Tobacco Use  Smoking Status Former   Packs/day: 0.25   Years: 15.00   Total pack years: 3.75   Types: Cigarettes   Quit date: 07/02/2022   Years since quitting: 0.2  Smokeless Tobacco Never  Tobacco Comments   "im a casual mosker, every now and then"     Goals Met:  Independence with exercise equipment Exercise tolerated well No report of concerns or symptoms today Strength training completed today  Goals Unmet:  Not Applicable  Comments: Checkout at 1200.   Dr. Carlyle Dolly is Medical Director for [facility]

## 2022-10-16 ENCOUNTER — Encounter (HOSPITAL_COMMUNITY)
Admission: RE | Admit: 2022-10-16 | Discharge: 2022-10-16 | Disposition: A | Discharge status: Home / Self Care | Payer: Commercial Managed Care - PPO | Source: Ambulatory Visit | Attending: Internal Medicine | Admitting: Internal Medicine

## 2022-10-16 DIAGNOSIS — Z955 Presence of coronary angioplasty implant and graft: Secondary | ICD-10-CM

## 2022-10-16 NOTE — Progress Notes (Signed)
Daily Session Note  Patient Details  Name: Carla Knight MRN: [id number] Date of Birth: 1976-06-01 Referring Provider:   Flowsheet Row CARDIAC REHAB PHASE II EXERCISE from 08/07/2022 in [place]  Referring Provider Dr. Tamala Julian       Encounter Date: 10/16/2022  Check In:  Session Check In - 10/16/22 1100       Check-In   Supervising physician immediately available to respond to emergencies [facility] immediately available    Physician(s) Dr Dellia Cloud    Location [facility]    Staff Present Leana Roe, BS, Exercise Physiologist;Verenise Moulin BSN, RN;Debra Wynetta Emery, RN, BSN    Virtual Visit No    Medication changes reported     No    Fall or balance concerns reported    No    Tobacco Cessation No Change    Warm-up and Cool-down Performed as group-led instruction    Resistance Training Performed Yes    VAD Patient? No    PAD/SET Patient? No      Pain Assessment   Currently in Pain? No/denies    Multiple Pain Sites No             Capillary Blood Glucose: No results found for this or any previous visit (from the past 24 hour(s)).    Social History   Tobacco Use  Smoking Status Former   Packs/day: 0.25   Years: 15.00   Total pack years: 3.75   Types: Cigarettes   Quit date: 07/02/2022   Years since quitting: 0.2  Smokeless Tobacco Never  Tobacco Comments   "im a casual mosker, every now and then"     Goals Met:  Independence with exercise equipment Exercise tolerated well No report of concerns or symptoms today Strength training completed today  Goals Unmet:  Not Applicable  Comments: check out at 12:00   Dr. Carlyle Dolly is Medical Director for [place]

## 2022-10-18 ENCOUNTER — Encounter (HOSPITAL_COMMUNITY)
Admission: RE | Admit: 2022-10-18 | Discharge: 2022-10-18 | Disposition: A | Discharge status: Home / Self Care | Payer: Commercial Managed Care - PPO | Source: Ambulatory Visit | Attending: Internal Medicine | Admitting: Internal Medicine

## 2022-10-18 DIAGNOSIS — Z955 Presence of coronary angioplasty implant and graft: Secondary | ICD-10-CM | POA: Diagnosis present

## 2022-10-18 NOTE — Progress Notes (Signed)
Daily Session Note  Patient Details  Name: Carla Knight MRN: [id number] Date of Birth: 1976/09/11 Referring Provider:   Flowsheet Row CARDIAC REHAB PHASE II EXERCISE from 08/07/2022 in [place]  Referring Provider Dr. Tamala Julian       Encounter Date: 10/18/2022  Check In:  Session Check In - 10/18/22 1053       Check-In   Supervising physician immediately available to respond to emergencies [facility] immediately available    Physician(s) Dr Dellia Cloud    Location [facility]    Staff Present Leana Roe, BS, Exercise Physiologist;Debra Wynetta Emery, RN, Joanette Gula, RN, BSN    Virtual Visit No    Medication changes reported     No    Fall or balance concerns reported    No    Tobacco Cessation No Change    Warm-up and Cool-down Performed as group-led instruction    Resistance Training Performed Yes    VAD Patient? No    PAD/SET Patient? No      Pain Assessment   Currently in Pain? No/denies    Multiple Pain Sites No             Capillary Blood Glucose: No results found for this or any previous visit (from the past 24 hour(s)).    Social History   Tobacco Use  Smoking Status Former   Packs/day: 0.25   Years: 15.00   Total pack years: 3.75   Types: Cigarettes   Quit date: 07/02/2022   Years since quitting: 0.2  Smokeless Tobacco Never  Tobacco Comments   "im a casual mosker, every now and then"     Goals Met:  Independence with exercise equipment Exercise tolerated well No report of concerns or symptoms today Strength training completed today  Goals Unmet:  Not Applicable  Comments: Checkout at 1200.   Dr. Carlyle Dolly is Medical Director for [facility]

## 2022-10-21 ENCOUNTER — Encounter (HOSPITAL_COMMUNITY)
Admission: RE | Admit: 2022-10-21 | Discharge: 2022-10-21 | Disposition: A | Discharge status: Home / Self Care | Payer: Commercial Managed Care - PPO | Source: Ambulatory Visit | Attending: Internal Medicine | Admitting: Internal Medicine

## 2022-10-21 VITALS — Wt 171.1 lb

## 2022-10-21 DIAGNOSIS — Z955 Presence of coronary angioplasty implant and graft: Secondary | ICD-10-CM

## 2022-10-21 NOTE — Progress Notes (Signed)
Daily Session Note  Patient Details  Name: MAXYNE DEROCHER MRN: [id number] Date of Birth: [birth year] Referring Provider:   Flowsheet Row CARDIAC REHAB PHASE II EXERCISE from 08/07/2022 in [place]  Referring Provider Dr. Tamala Julian       Encounter Date: 10/21/2022  Check In:  Session Check In - 10/21/22 1058       Check-In   Supervising physician immediately available to respond to emergencies [facility] MD immediately available    Physician(s) Dr Harl Bowie    Location [facility]    Staff Present Leana Roe, BS, Exercise Physiologist;Debra Wynetta Emery, RN, Joanette Gula, RN, BSN    Medication changes reported     No    Fall or balance concerns reported    No    Tobacco Cessation No Change    Warm-up and Cool-down Performed as group-led instruction    Resistance Training Performed Yes    VAD Patient? No    PAD/SET Patient? No      Pain Assessment   Currently in Pain? No/denies    Multiple Pain Sites No             Capillary Blood Glucose: No results found for this or any previous visit (from the past 24 hour(s)).    Social History   Tobacco Use  Smoking Status Former   Packs/day: 0.25   Years: 15.00   Total pack years: 3.75   Types: Cigarettes   Quit date: 07/02/2022   Years since quitting: 0.3  Smokeless Tobacco Never  Tobacco Comments   "im a casual mosker, every now and then"     Goals Met:  Independence with exercise equipment Exercise tolerated well No report of concerns or symptoms today Strength training completed today  Goals Unmet:  Not Applicable  Comments: Checkout at 1200.   Dr. Carlyle Dolly is Medical Director for [facility]

## 2022-10-23 ENCOUNTER — Encounter (HOSPITAL_COMMUNITY)
Admission: RE | Admit: 2022-10-23 | Discharge: 2022-10-23 | Disposition: A | Discharge status: Home / Self Care | Payer: Commercial Managed Care - PPO | Source: Ambulatory Visit | Attending: Internal Medicine | Admitting: Internal Medicine

## 2022-10-23 DIAGNOSIS — Z955 Presence of coronary angioplasty implant and graft: Secondary | ICD-10-CM

## 2022-10-23 NOTE — Progress Notes (Signed)
Cardiac Individual Treatment Plan  Patient Details  Name: Carla Knight MRN: [id number] Date of Birth: May 21, 1976 Referring Provider:   Flowsheet Row CARDIAC REHAB PHASE II EXERCISE from 08/07/2022 in [place]  Referring Provider Dr. Tamala Julian       Initial Encounter Date:  Flowsheet Row CARDIAC REHAB PHASE II EXERCISE from 08/07/2022 in [place]  Date 08/07/22       Visit Diagnosis: S/P drug eluting coronary stent placement  Patient's Home Medications on Admission:  Current Outpatient Medications:    acetaminophen (TYLENOL) 500 MG tablet, Take 500 mg by mouth every 8 (eight) hours as needed for headache or moderate pain., Disp: , Rfl:    aspirin EC 81 MG tablet, Take 1 tablet (81 mg total) by mouth daily. Swallow whole., Disp: 90 tablet, Rfl: 3   atorvastatin (LIPITOR) 80 MG tablet, Take 1 tablet (80 mg total) by mouth every evening., Disp: 90 tablet, Rfl: 3   glimepiride (AMARYL) 2 MG tablet, Take 2 mg by mouth daily with breakfast., Disp: , Rfl:    icosapent Ethyl (VASCEPA) 1 g capsule, Take 2 capsules (2 g total) by mouth 2 (two) times daily., Disp: 120 capsule, Rfl: 11   JARDIANCE 25 MG TABS tablet, Take 25 mg by mouth daily., Disp: , Rfl:    ketotifen (ZADITOR) 0.025 % ophthalmic solution, Place 1 drop into both eyes daily as needed (allergies)., Disp: , Rfl:    lisinopril (PRINIVIL,ZESTRIL) 10 MG tablet, Take 10 mg by mouth every morning. , Disp: , Rfl:    metFORMIN (GLUCOPHAGE) 1000 MG tablet, Take 1 tablet (1,000 mg total) by mouth 2 (two) times daily., Disp: , Rfl:    metoprolol succinate (TOPROL XL) 25 MG 24 hr tablet, Take 1 tablet (25 mg total) by mouth daily., Disp: 30 tablet, Rfl: 11   nitroGLYCERIN (NITROSTAT) 0.4 MG SL tablet, Place 1 tablet (0.4 mg total) under the tongue every 5 (five) minutes as needed for chest pain., Disp: 25 tablet, Rfl: 0   OZEMPIC, 0.25 OR 0.5 MG/DOSE, 2 MG/3ML SOPN, Inject 0.5 mg into the skin  every Sunday., Disp: , Rfl:    Pediatric Multivit-Minerals-C (CHILDRENS GUMMIES) CHEW, Chew 2 tablets by mouth every evening., Disp: , Rfl:    pioglitazone (ACTOS) 15 MG tablet, Take 15 mg by mouth daily., Disp: , Rfl:    SYNTHROID 150 MCG tablet, Take 1 tablet (150 mcg total) by mouth daily before breakfast., Disp: 90 tablet, Rfl: 3   ticagrelor (BRILINTA) 90 MG TABS tablet, Take 1 tablet (90 mg total) by mouth 2 (two) times daily., Disp: 60 tablet, Rfl: 11  Past Medical History: Past Medical History:  Diagnosis Date   Diabetes mellitus    NIDDM, type 2    Dyslipidemia    Goiter    no current meds.   Hypertension    under control; has been on med. x 5-6 yrs.   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   right   PONV (postoperative nausea and vomiting)    nauseated when ate after surgery   Thyroid mass     Tobacco Use: Social History   Tobacco Use  Smoking Status Former   Packs/day: 0.25   Years: 15.00   Total pack years: 3.75   Types: Cigarettes   Quit date: 07/02/2022   Years since quitting: 0.3  Smokeless Tobacco Never  Tobacco  Comments   "im a casual mosker, every now and then"     Labs: Review Flowsheet  More data may exist      Latest Ref Rng & Units 06/10/2008 11/08/2011 04/29/2018 07/03/2022 08/30/2022  Labs for ITP Cardiac and Pulmonary Rehab  Cholestrol 100 - 199 mg/dL - - - 150  107   LDL (calc) 0 - 99 mg/dL - - - 41  39   HDL-C >39 mg/dL - - - 31  34   Trlycerides 0 - 149 mg/dL - - - 389  211   Hemoglobin A1c 4.8 - 5.6 % 5.5 (NOTE)   The ADA recommends the following therapeutic goal for glycemic   control related to Hgb A1C measurement:   Goal of Therapy:   < 7.0% Hgb A1C   Reference: American Diabetes Association: Clinical Practice   Recommendations 2008, Diabetes Care,  2008, 31:(Suppl 1).  - 7.2  - -  TCO2 0 - 100 mmol/L - 25  - - -     Capillary Blood Glucose: Lab Results  Component Value Date   GLUCAP 119 (H) 08/07/2022   GLUCAP 87 08/05/2022   GLUCAP 151 (H) 07/03/2022   GLUCAP 141 (H) 07/02/2022   GLUCAP 149 (H) 07/02/2022    POCT Glucose     Row Name 08/05/22 1338 08/07/22 1129           POCT Blood Glucose   Pre-Exercise 87 mg/dL --      Pre-Exercise #2 -- 119 mg/dL               Exercise Target Goals: Exercise Program Goal: Individual exercise prescription set using results from initial 6 min walk test and THRR while considering  patient's activity barriers and safety.   Exercise Prescription Goal: Starting with aerobic activity 30 plus minutes a day, 3 days per week for initial exercise prescription. Provide home exercise prescription and guidelines that participant acknowledges understanding prior to discharge.  Activity Barriers & Risk Stratification:  Activity Barriers & Cardiac Risk Stratification - 08/05/22 1256       Activity Barriers & Cardiac Risk Stratification   Activity Barriers Deconditioning    Cardiac Risk Stratification High             6 Minute Walk:  6 Minute Walk     Row Name 08/07/22 1129         6 Minute Walk   Distance 1350 feet     Walk Time 6 minutes     # of Rest Breaks 0     MPH 2.56     METS 3.91     RPE 10     VO2 Peak 13.67     Symptoms No     Resting HR 80 bpm     Resting BP 124/82     Resting Oxygen Saturation  97 %     Exercise Oxygen Saturation  during 6 min walk 97 %     Max Ex. HR 90 bpm     Max Ex. BP 124/80     2 Minute Post BP 118/84              Oxygen Initial Assessment:   Oxygen Re-Evaluation:   Oxygen Discharge (Final Oxygen Re-Evaluation):   Initial Exercise Prescription:  Initial Exercise Prescription - 08/07/22 1100       Date of Initial Exercise RX and Referring Provider   Date 08/07/22    Referring Provider Dr. Tamala Julian    Expected Discharge  Date 10/28/22      Treadmill   MPH 2    Grade 0     Minutes 17      NuStep   Level 1    SPM 80    Minutes 22      Prescription Details   Frequency (times per week) 3    Duration Progress to 30 minutes of continuous aerobic without signs/symptoms of physical distress      Intensity   THRR 40-80% of Max Heartrate 70-139    Ratings of Perceived Exertion 11-13    Perceived Dyspnea 0-4      Resistance Training   Training Prescription Yes    Weight 3    Reps 10-15             Perform Capillary Blood Glucose checks as needed.  Exercise Prescription Changes:   Exercise Prescription Changes     Row Name 08/14/22 1200 08/26/22 1100 08/26/22 1200 09/09/22 1200 09/23/22 1500     Response to Exercise   Blood Pressure (Admit) 136/74 -- 128/74 110/58 118/70   Blood Pressure (Exercise) 142/74 -- 134/68 122/74 138/80   Blood Pressure (Exit) 120/70 -- 116/72 104/68 118/72   Heart Rate (Admit) 95 bpm -- 83 bpm 85 bpm 80 bpm   Heart Rate (Exercise) 119 bpm -- 100 bpm 124 bpm 115 bpm   Heart Rate (Exit) 95 bpm -- 91 bpm 96 bpm 89 bpm   Rating of Perceived Exertion (Exercise) 12 -- '12 12 12   '$ Duration Continue with 30 min of aerobic exercise without signs/symptoms of physical distress. -- Continue with 30 min of aerobic exercise without signs/symptoms of physical distress. Continue with 30 min of aerobic exercise without signs/symptoms of physical distress. Continue with 30 min of aerobic exercise without signs/symptoms of physical distress.   Intensity THRR unchanged -- THRR unchanged THRR unchanged THRR unchanged     Progression   Progression Continue to progress workloads to maintain intensity without signs/symptoms of physical distress. -- Continue to progress workloads to maintain intensity without signs/symptoms of physical distress. Continue to progress workloads to maintain intensity without signs/symptoms of physical distress. Continue to progress workloads to maintain intensity without signs/symptoms of physical distress.      Resistance Training   Training Prescription Yes -- Yes Yes Yes   Weight 3 -- '3 5 4   '$ Reps 10-15 -- 10-15 10-15 10-15   Time 10 Minutes -- 10 Minutes 10 Minutes 10 Minutes     Treadmill   MPH 2 -- 2.8 3 2.8   Grade 0 -- '2 3 3   '$ Minutes 17 -- '17 17 17   '$ METs 2.53 -- 3.91 4.54 4.3     NuStep   Level 2 -- '2 3 2   '$ SPM 96 -- 103 105 110   Minutes 22 -- '22 22 22   '$ METs 2.13 -- 2.37 2.69 2.7     Home Exercise Plan   Plans to continue exercise at -- Home (comment) -- -- --   Frequency -- Add 2 additional days to program exercise sessions. -- -- --   Initial Home Exercises Provided -- 08/26/22 -- -- --    [place] Name 10/07/22 1200 10/21/22 1500           Response to Exercise   Blood Pressure (Admit) 120/60 132/72      Blood Pressure (Exercise) 130/70 132/68      Blood Pressure (Exit) 100/64 118/68      Heart Rate (Admit)  80 bpm 96 bpm      Heart Rate (Exercise) 109 bpm 117 bpm      Heart Rate (Exit) 89 bpm 105 bpm      Rating of Perceived Exertion (Exercise) 12 12      Duration Continue with 30 min of aerobic exercise without signs/symptoms of physical distress. Continue with 30 min of aerobic exercise without signs/symptoms of physical distress.      Intensity THRR unchanged THRR unchanged        Progression   Progression Continue to progress workloads to maintain intensity without signs/symptoms of physical distress. Continue to progress workloads to maintain intensity without signs/symptoms of physical distress.        Resistance Training   Training Prescription Yes Yes      Weight 4 4      Reps 10-15 10-15      Time 10 Minutes 10 Minutes        Treadmill   MPH 3 3      Grade 3 3      Minutes 17 17      METs 4.54 4.54        NuStep   Level 3 3      SPM 111 105      Minutes 22 22      METs 2.93 2.67               Exercise Comments:   Exercise Comments     Row Name 08/26/22 1126           Exercise Comments home exercise reviewed                 Exercise Goals and Review:   Exercise Goals     Row Name 08/07/22 1131 08/26/22 1215 09/23/22 1535 10/21/22 1503       Exercise Goals   Increase Physical Activity Yes Yes Yes Yes    Intervention Provide advice, education, support and counseling about physical activity/exercise needs.;Develop an individualized exercise prescription for aerobic and resistive training based on initial evaluation findings, risk stratification, comorbidities and participant's personal goals. Provide advice, education, support and counseling about physical activity/exercise needs.;Develop an individualized exercise prescription for aerobic and resistive training based on initial evaluation findings, risk stratification, comorbidities and participant's personal goals. Provide advice, education, support and counseling about physical activity/exercise needs.;Develop an individualized exercise prescription for aerobic and resistive training based on initial evaluation findings, risk stratification, comorbidities and participant's personal goals. Provide advice, education, support and counseling about physical activity/exercise needs.;Develop an individualized exercise prescription for aerobic and resistive training based on initial evaluation findings, risk stratification, comorbidities and participant's personal goals.    Expected Outcomes Short Term: Attend rehab on a regular basis to increase amount of physical activity.;Long Term: Add in home exercise to make exercise part of routine and to increase amount of physical activity.;Long Term: Exercising regularly at least 3-5 days a week. Short Term: Attend rehab on a regular basis to increase amount of physical activity.;Long Term: Add in home exercise to make exercise part of routine and to increase amount of physical activity.;Long Term: Exercising regularly at least 3-5 days a week. Short Term: Attend rehab on a regular basis to increase amount of physical activity.;Long  Term: Add in home exercise to make exercise part of routine and to increase amount of physical activity.;Long Term: Exercising regularly at least 3-5 days a week. Short Term: Attend rehab on a regular basis to increase amount of physical activity.;Long Term:  Add in home exercise to make exercise part of routine and to increase amount of physical activity.;Long Term: Exercising regularly at least 3-5 days a week.    Increase Strength and Stamina Yes Yes Yes Yes    Intervention Provide advice, education, support and counseling about physical activity/exercise needs.;Develop an individualized exercise prescription for aerobic and resistive training based on initial evaluation findings, risk stratification, comorbidities and participant's personal goals. Provide advice, education, support and counseling about physical activity/exercise needs.;Develop an individualized exercise prescription for aerobic and resistive training based on initial evaluation findings, risk stratification, comorbidities and participant's personal goals. Provide advice, education, support and counseling about physical activity/exercise needs.;Develop an individualized exercise prescription for aerobic and resistive training based on initial evaluation findings, risk stratification, comorbidities and participant's personal goals. Provide advice, education, support and counseling about physical activity/exercise needs.;Develop an individualized exercise prescription for aerobic and resistive training based on initial evaluation findings, risk stratification, comorbidities and participant's personal goals.    Expected Outcomes Short Term: Increase workloads from initial exercise prescription for resistance, speed, and METs.;Short Term: Perform resistance training exercises routinely during rehab and add in resistance training at home;Long Term: Improve cardiorespiratory fitness, muscular endurance and strength as measured by increased METs and  functional capacity (6MWT) Short Term: Increase workloads from initial exercise prescription for resistance, speed, and METs.;Short Term: Perform resistance training exercises routinely during rehab and add in resistance training at home;Long Term: Improve cardiorespiratory fitness, muscular endurance and strength as measured by increased METs and functional capacity (6MWT) Short Term: Increase workloads from initial exercise prescription for resistance, speed, and METs.;Short Term: Perform resistance training exercises routinely during rehab and add in resistance training at home;Long Term: Improve cardiorespiratory fitness, muscular endurance and strength as measured by increased METs and functional capacity (6MWT) Short Term: Increase workloads from initial exercise prescription for resistance, speed, and METs.;Short Term: Perform resistance training exercises routinely during rehab and add in resistance training at home;Long Term: Improve cardiorespiratory fitness, muscular endurance and strength as measured by increased METs and functional capacity (6MWT)    Able to understand and use rate of perceived exertion (RPE) scale Yes Yes Yes Yes    Intervention Provide education and explanation on how to use RPE scale Provide education and explanation on how to use RPE scale Provide education and explanation on how to use RPE scale Provide education and explanation on how to use RPE scale    Expected Outcomes Short Term: Able to use RPE daily in rehab to express subjective intensity level;Long Term:  Able to use RPE to guide intensity level when exercising independently Short Term: Able to use RPE daily in rehab to express subjective intensity level;Long Term:  Able to use RPE to guide intensity level when exercising independently Short Term: Able to use RPE daily in rehab to express subjective intensity level;Long Term:  Able to use RPE to guide intensity level when exercising independently Short Term: Able to use  RPE daily in rehab to express subjective intensity level;Long Term:  Able to use RPE to guide intensity level when exercising independently    Knowledge and understanding of Target Heart Rate Range (THRR) Yes Yes Yes Yes    Intervention Provide education and explanation of THRR including how the numbers were predicted and where they are located for reference Provide education and explanation of THRR including how the numbers were predicted and where they are located for reference Provide education and explanation of THRR including how the numbers were predicted and where they  are located for reference Provide education and explanation of THRR including how the numbers were predicted and where they are located for reference    Expected Outcomes Short Term: Able to state/look up THRR;Long Term: Able to use THRR to govern intensity when exercising independently;Short Term: Able to use daily as guideline for intensity in rehab Short Term: Able to state/look up THRR;Long Term: Able to use THRR to govern intensity when exercising independently;Short Term: Able to use daily as guideline for intensity in rehab Short Term: Able to state/look up THRR;Long Term: Able to use THRR to govern intensity when exercising independently;Short Term: Able to use daily as guideline for intensity in rehab Short Term: Able to state/look up THRR;Long Term: Able to use THRR to govern intensity when exercising independently;Short Term: Able to use daily as guideline for intensity in rehab    Able to check pulse independently Yes Yes Yes Yes    Intervention Provide education and demonstration on how to check pulse in carotid and radial arteries.;Review the importance of being able to check your own pulse for safety during independent exercise Provide education and demonstration on how to check pulse in carotid and radial arteries.;Review the importance of being able to check your own pulse for safety during independent exercise Provide  education and demonstration on how to check pulse in carotid and radial arteries.;Review the importance of being able to check your own pulse for safety during independent exercise Provide education and demonstration on how to check pulse in carotid and radial arteries.;Review the importance of being able to check your own pulse for safety during independent exercise    Expected Outcomes Short Term: Able to explain why pulse checking is important during independent exercise;Long Term: Able to check pulse independently and accurately Short Term: Able to explain why pulse checking is important during independent exercise;Long Term: Able to check pulse independently and accurately Short Term: Able to explain why pulse checking is important during independent exercise;Long Term: Able to check pulse independently and accurately Short Term: Able to explain why pulse checking is important during independent exercise;Long Term: Able to check pulse independently and accurately    Understanding of Exercise Prescription Yes Yes Yes Yes    Intervention Provide education, explanation, and written materials on patient's individual exercise prescription Provide education, explanation, and written materials on patient's individual exercise prescription Provide education, explanation, and written materials on patient's individual exercise prescription Provide education, explanation, and written materials on patient's individual exercise prescription    Expected Outcomes Short Term: Able to explain program exercise prescription;Long Term: Able to explain home exercise prescription to exercise independently Short Term: Able to explain program exercise prescription;Long Term: Able to explain home exercise prescription to exercise independently Short Term: Able to explain program exercise prescription;Long Term: Able to explain home exercise prescription to exercise independently Short Term: Able to explain program exercise  prescription;Long Term: Able to explain home exercise prescription to exercise independently             Exercise Goals Re-Evaluation :  Exercise Goals Re-Evaluation     Row Name 08/26/22 1215 09/23/22 1535 10/21/22 1503         Exercise Goal Re-Evaluation   Exercise Goals Review Increase Physical Activity;Able to understand and use rate of perceived exertion (RPE) scale;Increase Strength and Stamina;Knowledge and understanding of Target Heart Rate Range (THRR);Able to check pulse independently;Understanding of Exercise Prescription Increase Physical Activity;Increase Strength and Stamina;Able to understand and use rate of perceived exertion (RPE) scale;Knowledge and understanding  of Target Heart Rate Range (THRR);Able to check pulse independently;Understanding of Exercise Prescription Increase Physical Activity;Increase Strength and Stamina;Able to understand and use rate of perceived exertion (RPE) scale;Knowledge and understanding of Target Heart Rate Range (THRR);Able to check pulse independently;Understanding of Exercise Prescription     Comments Pt has completed 10 sessions of cardiac rehab. She has been able to increase her workloads and she is motivated to progress. She reports that she is walking several days per week outside of rehab, and I reveiwed home exercise guidelines with her today. She is currently exercising at 3.91 METs on the treadmill. Will continue to monitor and progress as able. Pt has completed 21 sessions of cardiac rehab. She continues to increase her workloads. She is motivated during class and enjoys coming. She is walking on her days outside of class. She is currently exercising at 4.30 METs on the treadmill. Will continue to monitor and progress as able. Pt has completed 30 sessions of cardiac rehab. She continues tp come to class motivated and pushes herself. She is walking on her days outside of class. She is currently exercising at 4.54 METs on the treadmill. Will  continue to monitor and progress as able.     Expected Outcomes Through exercise at rehab and at home, the patient will meet their stated goals. Through exercise at rehab and at home, the patient will meet their stated goals. Through exercise at rehab and at home, the patient will meet their stated goals.               Discharge Exercise Prescription (Final Exercise Prescription Changes):  Exercise Prescription Changes - 10/21/22 1500       Response to Exercise   Blood Pressure (Admit) 132/72    Blood Pressure (Exercise) 132/68    Blood Pressure (Exit) 118/68    Heart Rate (Admit) 96 bpm    Heart Rate (Exercise) 117 bpm    Heart Rate (Exit) 105 bpm    Rating of Perceived Exertion (Exercise) 12    Duration Continue with 30 min of aerobic exercise without signs/symptoms of physical distress.    Intensity THRR unchanged      Progression   Progression Continue to progress workloads to maintain intensity without signs/symptoms of physical distress.      Resistance Training   Training Prescription Yes    Weight 4    Reps 10-15    Time 10 Minutes      Treadmill   MPH 3    Grade 3    Minutes 17    METs 4.54      NuStep   Level 3    SPM 105    Minutes 22    METs 2.67             Nutrition:  Target Goals: Understanding of nutrition guidelines, daily intake of sodium '1500mg'$ , cholesterol '200mg'$ , calories 30% from fat and 7% or less from saturated fats, daily to have 5 or more servings of fruits and vegetables.  Biometrics:  Pre Biometrics - 08/07/22 1132       Pre Biometrics   Height '5\' 3"'$  (1.6 m)    Weight 79 kg    Waist Circumference 40.5 inches    Hip Circumference 41 inches    Waist to Hip Ratio 0.99 %    BMI (Calculated) 30.86    Triceps Skinfold 23 mm    % Body Fat 40.2 %    Grip Strength 22.9 kg    Flexibility 11 in  Single Leg Stand 19.86 seconds              Nutrition Therapy Plan and Nutrition Goals:  Nutrition Therapy & Goals -  08/19/22 0946       Personal Nutrition Goals   Comments Patient scored 64 on her diet assessment. We offer 2 educational sessions on heart healthy nutrition with handouts and assistance with RD referral if patient is interested.      Intervention Plan   Intervention Nutrition handout(s) given to patient.    Expected Outcomes Short Term Goal: Understand basic principles of dietary content, such as calories, fat, sodium, cholesterol and nutrients.             Nutrition Assessments:  Nutrition Assessments - 08/05/22 1259       MEDFICTS Scores   Pre Score 64            MEDIFICTS Score Key: ?70 Need to make dietary changes  40-70 Heart Healthy Diet ? 40 Therapeutic Level Cholesterol Diet   Picture Your Plate Scores: <76 Unhealthy dietary pattern with much room for improvement. 41-50 Dietary pattern unlikely to meet recommendations for good health and room for improvement. 51-60 More healthful dietary pattern, with some room for improvement.  >60 Healthy dietary pattern, although there may be some specific behaviors that could be improved.    Nutrition Goals Re-Evaluation:   Nutrition Goals Discharge (Final Nutrition Goals Re-Evaluation):   Psychosocial: Target Goals: Acknowledge presence or absence of significant depression and/or stress, maximize coping skills, provide positive support system. Participant is able to verbalize types and ability to use techniques and skills needed for reducing stress and depression.  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 Yes      Barriers   Psychosocial barriers to participate in program There are no identifiable barriers or  psychosocial needs.      Screening Interventions   Interventions Encouraged to exercise    Expected Outcomes Long Term goal: The participant improves quality of Life and PHQ9 Scores as seen by post scores and/or verbalization of changes;Short Term goal: Identification and review with participant of any Quality of Life or Depression concerns found by scoring the questionnaire.             Quality of Life Scores:  Quality of Life - 08/05/22 1347       Quality of Life   Select Quality of Life      Quality of Life Scores   Health/Function Pre 19.2 %    Socioeconomic Pre 19.86 %    Psych/Spiritual Pre 19.42 %    Family Pre 20.1 %    GLOBAL Pre 19.52 %            Scores of 19 and below usually indicate a poorer quality of life in these areas.  A difference of  2-3 points is a clinically meaningful difference.  A difference of 2-3 points in the total score of the Quality of Life Index has been associated with significant improvement in overall quality of life, self-image, physical symptoms, and general health in studies assessing change in quality of life.  PHQ-9: Review Flowsheet  08/05/2022 07/21/2014  Depression screen PHQ 2/9  Decreased Interest 0 0  Down, Depressed, Hopeless 1 0  PHQ - 2 Score 1 0  Altered sleeping 1 -  Tired, decreased energy 2 -  Change in appetite 2 -  Feeling bad or failure about yourself  1 -  Trouble concentrating 0 -  Moving slowly or fidgety/restless 0 -  Suicidal thoughts 0 -  PHQ-9 Score 7 -  Difficult doing work/chores Somewhat difficult -   Interpretation of Total Score  Total Score Depression Severity:  1-4 = Minimal depression, 5-9 = Mild depression, 10-14 = Moderate depression, 15-19 = Moderately severe depression, 20-27 = Severe depression   Psychosocial Evaluation and Intervention:  Psychosocial Evaluation - 08/05/22 1347       Psychosocial Evaluation & Interventions   Interventions Encouraged to exercise with the program  and follow exercise prescription    Comments Pt has no barriers to participating in CR. She does report sleep and stress concerns, but she denies anxiety and depression. She reports having trouble staying asleep and that she frequently wakes up in the night. She does take melatonin or benadryl occassionaly. Her stress concerns are due to work. 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 She scored a 7 on her PHQ-9. She reports that her energy levels have been low since her stent, and this also causes her to feel down. She reports that she has a good support system with her husband. Her goals in the program are to learn more about heart disease and to learn more about nutrition. She is eager to start the program and will be coming during her lunch break from work.    Expected Outcomes Pt's stressors will be reduced and she will have no other identifiable psychosocial issues.    Continue Psychosocial Services  No Follow up required             Psychosocial Re-Evaluation:  Psychosocial Re-Evaluation     [facility] Name 08/19/22 0946 09/16/22 1221 10/14/22 0924         Psychosocial Re-Evaluation   Current issues with Current Sleep Concerns;Current Stress Concerns Current Sleep Concerns;Current Stress Concerns Current Sleep Concerns;Current Stress Concerns     Comments Patient is new to the program completing 6 sessions. She continues to have no psychosocial barriers identified. She continues to use Melatonin and Benadryl for sleep. She feels she is able to manage the stress from her job. She seems to enjoy the sessions and demonstrates an intertest in improving her health. We will continue to monitor. Patient has completed 17 sessions. She continues to have no psychosocial barriers identified. She continues to use Melatonin and Benadryl for sleep. She continues to  feel like she is able to  manage the stress from her job. She continues to enjoy the sessions and demonstrates an intertest in improving her health. We will continue to monitor. Patient has completed 26 sessions. She continues to have no psychosocial barriers identified. She continues to use Melatonin and Benadryl for sleep. She continues to  feel like she is able to manage the stress from her job. She continues to enjoy the sessions and demonstrates an intertest in improving her health. We will continue to monitor.     Expected Outcomes Patient will continue to have no psychosocial barriers identified. Patient will continue to have no psychosocial barriers identified. Patient will continue to have no  psychosocial barriers identified.     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 She continues to have no psychosocial barriers identified. She continues to use Melatonin and Benadryl for sleep. She continues to  feel like she is able to manage the stress from her job. She continues to enjoy the sessions and demonstrates  an intertest in improving her health. We will continue to monitor.    Expected Outcomes Patient will continue to have no psychosocial barriers identified.    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   Vocational Rehab Evaluation & Intervention:  Vocational Rehab - 08/05/22 1307       Initial Vocational Rehab Evaluation & Intervention   Assessment shows need for Vocational Rehabilitation No      Vocational Rehab Re-Evaulation   Comments Already returned to work             Education: Education Goals: Education classes will be provided on a weekly basis, covering required topics. Participant will state understanding/return demonstration of topics presented.  Learning Barriers/Preferences:  Learning Barriers/Preferences - 08/05/22 1305       Learning Barriers/Preferences   Learning Barriers None    Learning Preferences Audio;Computer/Internet;Group Instruction;Individual Instruction;Pictoral;Skilled Demonstration;Verbal Instruction;Video;Written Material             Education Topics: Hypertension, Hypertension Reduction -Define heart disease and high blood pressure. Discus how high blood pressure affects the body and ways to reduce high blood pressure. Flowsheet Row CARDIAC REHAB PHASE II EXERCISE from 10/23/2022 in [place]  Date 08/21/22  Educator [place]  Instruction Review Code 1- Verbalizes Understanding       Exercise and Your Heart -Discuss why it is important to exercise, the FITT principles of exercise, normal and abnormal responses to exercise, and how to exercise  safely. Flowsheet Row CARDIAC REHAB PHASE II EXERCISE from 10/23/2022 in [place]  Date 08/28/22  Educator [place]  Instruction Review Code 1- Verbalizes Understanding       Angina -Discuss definition of angina, causes of angina, treatment of angina, and how to decrease risk of having angina. Flowsheet Row CARDIAC REHAB PHASE II EXERCISE from 10/23/2022 in [place]  Date 09/06/22  Educator DF  Instruction Review Code  2- Demonstrated Understanding       Cardiac Medications -Review what the following cardiac medications are used for, how they affect the body, and side effects that may occur when taking the medications.  Medications include Aspirin, Beta blockers, calcium channel blockers, ACE Inhibitors, angiotensin receptor blockers, diuretics, digoxin, and antihyperlipidemics. Flowsheet Row CARDIAC REHAB PHASE II EXERCISE from 10/23/2022 in [place]  Date 09/11/22  Educator HB  Instruction Review Code 1- Verbalizes Understanding       Congestive Heart Failure -Discuss the definition of CHF, how to live with CHF, the signs and symptoms of CHF, and how keep track of weight and sodium intake. Flowsheet Row CARDIAC REHAB PHASE II EXERCISE from 10/23/2022 in [place]  Date 09/18/22  Educator HB  Instruction Review Code 1- Verbalizes Understanding       Heart Disease and Intimacy -Discus the effect sexual activity has on the heart, how changes occur during intimacy as we age, and safety during sexual activity. Flowsheet Row CARDIAC REHAB PHASE II EXERCISE from 10/23/2022 in [place]  Date 09/25/22  Educator hb  Instruction Review Code 1- Verbalizes Understanding       Smoking Cessation / COPD -Discuss different methods to quit smoking, the health benefits of quitting smoking, and the definition of COPD. Flowsheet Row CARDIAC REHAB PHASE II EXERCISE from  10/23/2022 in [place]  Date 10/02/22  Educator HB  Instruction Review Code 1- Verbalizes Understanding       Nutrition I: Fats -Discuss the types of cholesterol, what cholesterol does to the heart, and how cholesterol levels can be controlled. Flowsheet Row CARDIAC REHAB PHASE II EXERCISE from 10/23/2022 in [place]  Date 10/14/22  Educator HB  Instruction Review Code 1- Verbalizes Understanding       Nutrition II: Labels -Discuss the different components of food labels and how to read food label [place] from 10/23/2022 in [place]  Date 10/16/22  Educator HB  Instruction Review Code 1- Verbalizes Understanding       Heart Parts/Heart Disease and PAD -Discuss the anatomy of the heart, the pathway of blood circulation through the heart, and these are affected by heart disease.   Stress I: Signs and Symptoms -Discuss the causes of stress, how stress may lead to anxiety and depression, and ways to limit stress. Flowsheet Row CARDIAC REHAB PHASE II EXERCISE from 10/23/2022 in [place]  Date 08/07/22  Educator DF  Instruction Review Code 1- Verbalizes Understanding       Stress II: Relaxation -Discuss different types of relaxation techniques to limit stress. Flowsheet Row CARDIAC REHAB PHASE II EXERCISE from 10/23/2022 in [place]  Date 08/14/22  Educator hj  Instruction Review Code 1- Verbalizes Understanding       Warning Signs of Stroke / TIA -Discuss definition of a stroke, what the signs and symptoms are of a stroke, and how to identify when someone is having stroke.   Knowledge Questionnaire Score:  Knowledge Questionnaire Score - 08/05/22 1305       Knowledge Questionnaire Score   Pre Score 23/24             Core Components/Risk Factors/Patient Goals at Admission:  Personal Goals and Risk  Factors at Admission - 08/05/22 1308       Core Components/Risk Factors/Patient Goals on Admission   Diabetes Yes    Intervention Provide education  about signs/symptoms and action to take for hypo/hyperglycemia.;Provide education about proper nutrition, including hydration, and aerobic/resistive exercise prescription along with prescribed medications to achieve blood glucose in normal ranges: Fasting glucose 65-99 mg/dL    Expected Outcomes Short Term: Participant verbalizes understanding of the signs/symptoms and immediate care of hyper/hypoglycemia, proper foot care and importance of medication, aerobic/resistive exercise and nutrition plan for blood glucose control.;Long Term: Attainment of HbA1C < 7%.    Hypertension Yes    Intervention Provide education on lifestyle modifcations including regular physical activity/exercise, weight management, moderate sodium restriction and increased consumption of fresh fruit, vegetables, and low fat dairy, alcohol moderation, and smoking cessation.;Monitor prescription use compliance.    Expected Outcomes Short Term: Continued assessment and intervention until BP is < 140/54m HG in hypertensive participants. < 130/881mHG in hypertensive participants with diabetes, heart failure or chronic kidney disease.;Long Term: Maintenance of blood pressure at goal levels.    Lipids Yes    Intervention Provide education and support for participant on nutrition & aerobic/resistive exercise along with prescribed medications to achieve LDL '70mg'$ , HDL >'40mg'$ .    Expected Outcomes Short Term: Participant states understanding of desired cholesterol values and is compliant with medications prescribed. Participant is following exercise prescription and nutrition guidelines.;Long Term: Cholesterol controlled with medications as prescribed, with individualized exercise RX and with personalized nutrition plan. Value goals: LDL < '70mg'$ , HDL > 40 mg.    Personal Goal Other Yes     Personal Goal Learn about diet and heart disease.    Intervention Attend cardiac rehab education sessions.    Expected Outcomes Pt will meet stated goals.             Core Components/Risk Factors/Patient Goals Review:   Goals and Risk Factor Review     Row Name 08/19/22 0951 09/16/22 1222 10/14/22 0925         Core Components/Risk Factors/Patient Goals Review   Personal Goals Review Hypertension;Diabetes;Lipids;Other Hypertension;Diabetes;Lipids;Other Hypertension;Diabetes;Lipids;Other     Review Patient was referred to CR with Stent placement. She has multiple risk factors for CAD and is participating in the program for risk modification. She has completed 6 sessions. Hercurrent weight is 174.4 lbs maintained since her initial visit. She is doing well in the program with progressions and consistent attendance. Her blood pressure is at goal. Her DM is managed by Actos, Ozempic, Metformin, Jardiance and glimperide. No recent A1C on file. Her personal goals for the program are to learn about nutrition and heart disease. We will continue to monitor her progress as she works towards meeting these goals. Patient  has completed 17 sessions. Her current weight is 175.1 lbs up 0.7 lbs from last 30 day review. She continues to do well in the program with progressions and consistent attendance. Her blood pressure continues to be at goal. Her DM is managed by Actos, Ozempic, Metformin, Jardiance and glimperide. No recent A1C on file. Her personal goals for the program continue to be to learn about nutrition and heart disease. We will continue to monitor her progress as she works towards meeting these goals. Patient  has completed 26 sessions. Her current weight is 174.1 lbs down 1 lb from last 30 day review. She continues to do well in the program with progressions and consistent attendance. Her blood pressure continues to be at goal. Her DM is managed by Actos, Ozempic, Metformin, Jardiance and glimperide.  Her last A1C was 6/23 at 7.3%. She saw her cardiologist 11/14 for routine follow up. She reported she  felt like she was doing well in CR. No changes made.  Her personal goals for the program continue to be to learn about nutrition and heart disease. We will continue to monitor her progress as she works towards meeting these goals.     Expected Outcomes Patient will complete the program meeting both personal and program goals. Patient will complete the program meeting both personal and program goals. Patient will complete the program meeting both personal and program goals.              Core Components/Risk Factors/Patient Goals at Discharge (Final Review):   Goals and Risk Factor Review - 10/14/22 0925       Core Components/Risk Factors/Patient Goals Review   Personal Goals Review Hypertension;Diabetes;Lipids;Other    Review Patient  has completed 26 sessions. Her current weight is 174.1 lbs down 1 lb from last 30 day review. She continues to do well in the program with progressions and consistent attendance. Her blood pressure continues to be at goal. Her DM is managed by Actos, Ozempic, Metformin, Jardiance and glimperide. Her last A1C was 6/23 at 7.3%. She saw her cardiologist 11/14 for routine follow up. She reported she felt like she was doing well in CR. No changes made.  Her personal goals for the program continue to be to learn about nutrition and heart disease. We will continue to monitor her progress as she works towards meeting these goals.    Expected Outcomes Patient will complete the program meeting both personal and program goals.             ITP Comments:   Comments: ITP REVIEW Pt is making expected progress toward Cardiac Rehab goals after completing 31 sessions. Recommend continued exercise, life style modification, education, and increased stamina and strength.

## 2022-10-23 NOTE — Progress Notes (Signed)
Daily Session Note  Patient Details  Name: Carla Knight MRN: [id number] Date of Birth: October 18, 1976 Referring Provider:   Flowsheet Row CARDIAC REHAB PHASE II EXERCISE from 08/07/2022 in [place]  Referring Provider Dr. Tamala Julian       Encounter Date: 10/23/2022  Check In:  Session Check In - 10/23/22 1100       Check-In   Supervising physician immediately available to respond to emergencies [facility] MD immediately available    Physician(s) Dr. Dellia Cloud    Location [facility]    Staff Present Leana Roe, BS, Exercise Physiologist;Cindy Fullman BSN, RN;Debra Wynetta Emery, RN, BSN    Virtual Visit No    Medication changes reported     No    Fall or balance concerns reported    No    Tobacco Cessation No Change    Warm-up and Cool-down Performed as group-led instruction    Resistance Training Performed Yes    VAD Patient? No    PAD/SET Patient? No      Pain Assessment   Currently in Pain? No/denies    Multiple Pain Sites No             Capillary Blood Glucose: No results found for this or any previous visit (from the past 24 hour(s)).    Social History   Tobacco Use  Smoking Status Former   Packs/day: 0.25   Years: 15.00   Total pack years: 3.75   Types: Cigarettes   Quit date: 07/02/2022   Years since quitting: 0.3  Smokeless Tobacco Never  Tobacco Comments   "im a casual mosker, every now and then"     Goals Met:  Independence with exercise equipment Exercise tolerated well No report of concerns or symptoms today Strength training completed today  Goals Unmet:  Not Applicable  Comments: check out at 12:00   Dr. Carlyle Dolly is Medical Director for [place]

## 2022-10-25 ENCOUNTER — Encounter (HOSPITAL_COMMUNITY)
Admission: RE | Admit: 2022-10-25 | Discharge: 2022-10-25 | Disposition: A | Discharge status: Home / Self Care | Payer: Commercial Managed Care - PPO | Source: Ambulatory Visit | Attending: Internal Medicine | Admitting: Internal Medicine

## 2022-10-25 DIAGNOSIS — Z955 Presence of coronary angioplasty implant and graft: Secondary | ICD-10-CM | POA: Diagnosis not present

## 2022-10-25 NOTE — Progress Notes (Signed)
Daily Session Note  Patient Details  Name: Carla Knight MRN: [id number] Date of Birth: 1976-03-19 Referring Provider:   Flowsheet Row CARDIAC REHAB PHASE II EXERCISE from 08/07/2022 in [place]  Referring Provider Dr. Tamala Julian       Encounter Date: 10/25/2022  Check In:  Session Check In - 10/25/22 1100       Check-In   Supervising physician immediately available to respond to emergencies [facility] MD immediately available    Physician(s) Dr. Zandra Abts    Location [facility]    Staff Present Hoy Register MHA, MS, ACSM-CEP;Leana Roe, BS, Exercise Physiologist;[facility] [place] Wynetta Emery, RN, BSN    Virtual Visit No    Medication changes reported     No    Fall or balance concerns reported    No    Tobacco Cessation No Change    Warm-up and Cool-down Performed as group-led instruction    Resistance Training Performed Yes    VAD Patient? No    PAD/SET Patient? No      Pain Assessment   Currently in Pain? No/denies    Multiple Pain Sites No             Capillary Blood Glucose: No results found for this or any previous visit (from the past 24 hour(s)).    Social History   Tobacco Use  Smoking Status Former   Packs/day: 0.25   Years: 15.00   Total pack years: 3.75   Types: Cigarettes   Quit date: 07/02/2022   Years since quitting: 0.3  Smokeless Tobacco Never  Tobacco Comments   "im a casual mosker, every now and then"     Goals Met:  Independence with exercise equipment Exercise tolerated well No report of concerns or symptoms today Strength training completed today  Goals Unmet:  Not Applicable  Comments: Check out 1200.   Dr. Carlyle Dolly is Medical Director for [facility]

## 2022-10-28 ENCOUNTER — Encounter (HOSPITAL_COMMUNITY)
Admission: RE | Admit: 2022-10-28 | Discharge: 2022-10-28 | Disposition: A | Discharge status: Home / Self Care | Payer: Commercial Managed Care - PPO | Source: Ambulatory Visit | Attending: Internal Medicine | Admitting: Internal Medicine

## 2022-10-28 VITALS — Ht 63.0 in | Wt 154.5 lb

## 2022-10-28 DIAGNOSIS — Z955 Presence of coronary angioplasty implant and graft: Secondary | ICD-10-CM | POA: Diagnosis not present

## 2022-10-28 NOTE — Progress Notes (Signed)
Daily Session Note  Patient Details  Name: Carla Knight MRN: [id number] Date of Birth: 10-27-1976 Referring Provider:   Flowsheet Row CARDIAC REHAB PHASE II EXERCISE from 08/07/2022 in [place]  Referring Provider Dr. Tamala Julian       Encounter Date: 10/28/2022  Check In:  Session Check In - 10/28/22 1058       Check-In   Supervising physician immediately available to respond to emergencies [facility] MD immediately available    Physician(s) Dr. Domenic Polite    Location [facility]    Staff Present Daphyne Hassell Done, RN, BSN;Rizwan Kuyper Sherrie George, MS, ACSM-CEP;Leana Roe, BS, Exercise Physiologist    Virtual Visit No    Medication changes reported     No    Fall or balance concerns reported    No    Tobacco Cessation No Change    Warm-up and Cool-down Performed as group-led instruction    Resistance Training Performed Yes    VAD Patient? No    PAD/SET Patient? No      Pain Assessment   Currently in Pain? No/denies    Multiple Pain Sites No             Capillary Blood Glucose: No results found for this or any previous visit (from the past 24 hour(s)).    Social History   Tobacco Use  Smoking Status Former   Packs/day: 0.25   Years: 15.00   Total pack years: 3.75   Types: Cigarettes   Quit date: 07/02/2022   Years since quitting: 0.3  Smokeless Tobacco Never  Tobacco Comments   "im a casual mosker, every now and then"     Goals Met:  Independence with exercise equipment Exercise tolerated well No report of concerns or symptoms today Strength training completed today  Goals Unmet:  Not Applicable  Comments: checkout time is 1200   Dr. Carlyle Dolly is Medical Director for [place]

## 2022-10-30 NOTE — Progress Notes (Signed)
Discharge Progress Report  Patient Details  Name: Carla Knight MRN: [id number] Date of Birth: [birth year] Referring Provider:   Flowsheet Row CARDIAC REHAB PHASE II EXERCISE from 08/07/2022 in [place]  Referring Provider Dr. Tamala Julian        Number of Visits: 33  Reason for Discharge:  Patient reached a stable level of exercise. Patient independent in their exercise. Patient has met program and personal goals.  Smoking History:  Social History   Tobacco Use  Smoking Status Former   Packs/day: 0.25   Years: 15.00   Total pack years: 3.75   Types: Cigarettes   Quit date: 07/02/2022   Years since quitting: 0.3  Smokeless Tobacco Never  Tobacco Comments   "im a casual mosker, every now and then"     Diagnosis:  S/P drug eluting coronary stent placement  ADL [facility]:   Initial Exercise Prescription:  Initial Exercise Prescription - 08/07/22 1100       Date of Initial Exercise RX and Referring Provider   Date 08/07/22    Referring Provider Dr. Tamala Julian    Expected Discharge Date 10/28/22      Treadmill   MPH 2    Grade 0    Minutes 17      NuStep   Level 1    SPM 80    Minutes 22      Prescription Details   Frequency (times per week) 3    Duration Progress to 30 minutes of continuous aerobic without signs/symptoms of physical distress      Intensity   THRR 40-80% of Max Heartrate 70-139    Ratings of Perceived Exertion 11-13    Perceived Dyspnea 0-4      Resistance Training   Training Prescription Yes    Weight 3    Reps 10-15             Discharge Exercise Prescription (Final Exercise Prescription Changes):  Exercise Prescription Changes - 10/21/22 1500       Response to Exercise   Blood Pressure (Admit) 132/72    Blood Pressure (Exercise) 132/68    Blood Pressure (Exit) 118/68    Heart Rate (Admit) 96 bpm    Heart Rate (Exercise) 117 bpm    Heart Rate (Exit) 105 bpm    Rating of Perceived Exertion (Exercise) 12     Duration Continue with 30 min of aerobic exercise without signs/symptoms of physical distress.    Intensity THRR unchanged      Progression   Progression Continue to progress workloads to maintain intensity without signs/symptoms of physical distress.      Resistance Training   Training Prescription Yes    Weight 4    Reps 10-15    Time 10 Minutes      Treadmill   MPH 3    Grade 3    Minutes 17    METs 4.54      NuStep   Level 3    SPM 105    Minutes 22    METs 2.67             Functional Capacity:  6 Minute Walk     Row Name 08/07/22 1129 10/28/22 1231       6 Minute Walk   Phase -- Discharge    Distance 1350 feet 1600 feet    Walk Time 6 minutes 6 minutes    # of Rest Breaks 0 0    MPH 2.56 3.03  METS 3.91 4.5    RPE 10 11    VO2 Peak 13.67 15.78    Symptoms No No    Resting HR 80 bpm 85 bpm    Resting BP 124/82 122/60    Resting Oxygen Saturation  97 % 98 %    Exercise Oxygen Saturation  during 6 min walk 97 % 98 %    Max Ex. HR 90 bpm 100 bpm    Max Ex. BP 124/80 132/60    2 Minute Post BP 118/84 120/60             Psychological, QOL, Others - Outcomes: PHQ 2/9:    10/30/2022    2:00 PM 08/05/2022   12:46 PM 07/21/2014   10:46 AM  Depression screen PHQ 2/9  Decreased Interest 0 0 0  Down, Depressed, Hopeless 1 1 0  PHQ - 2 Score 1 1 0  Altered sleeping 1 1   Tired, decreased energy 2 2   Change in appetite 1 2   Feeling bad or failure about yourself  1 1   Trouble concentrating 0 0   Moving slowly or fidgety/restless 0 0   Suicidal thoughts 0 0   PHQ-9 Score 6 7   Difficult doing work/chores Somewhat difficult Somewhat difficult     Quality of Life:  Quality of Life - 10/28/22 1232       Quality of Life Scores   Health/Function Pre 19.2 %    Health/Function Post 25.6 %    Health/Function % Change 33.33 %    Socioeconomic Pre 19.86 %    Socioeconomic Post 24.86 %    Socioeconomic % Change  25.18 %    Psych/Spiritual Pre  19.42 %    Psych/Spiritual Post 19.71 %    Psych/Spiritual % Change 1.49 %    Family Pre 20.1 %    Family Post 26.4 %    Family % Change 31.34 %    GLOBAL Pre 19.52 %    GLOBAL Post 24.35 %    GLOBAL % Change 24.74 %             Personal Goals: Goals established at orientation with interventions provided to work toward goal.  Personal Goals and Risk Factors at Admission - 08/05/22 1308       Core Components/Risk Factors/Patient Goals on Admission   Diabetes Yes    Intervention Provide education about signs/symptoms and action to take for hypo/hyperglycemia.;Provide education about proper nutrition, including hydration, and aerobic/resistive exercise prescription along with prescribed medications to achieve blood glucose in normal ranges: Fasting glucose 65-99 mg/dL    Expected Outcomes Short Term: Participant verbalizes understanding of the signs/symptoms and immediate care of hyper/hypoglycemia, proper foot care and importance of medication, aerobic/resistive exercise and nutrition plan for blood glucose control.;Long Term: Attainment of HbA1C < 7%.    Hypertension Yes    Intervention Provide education on lifestyle modifcations including regular physical activity/exercise, weight management, moderate sodium restriction and increased consumption of fresh fruit, vegetables, and low fat dairy, alcohol moderation, and smoking cessation.;Monitor prescription use compliance.    Expected Outcomes Short Term: Continued assessment and intervention until BP is < 140/3m HG in hypertensive participants. < 130/872mHG in hypertensive participants with diabetes, heart failure or chronic kidney disease.;Long Term: Maintenance of blood pressure at goal levels.    Lipids Yes    Intervention Provide education and support for participant on nutrition & aerobic/resistive exercise along with prescribed medications to achieve LDL <7062mHDL >3m59m  Expected Outcomes Short Term: Participant states  understanding of desired cholesterol values and is compliant with medications prescribed. Participant is following exercise prescription and nutrition guidelines.;Long Term: Cholesterol controlled with medications as prescribed, with individualized exercise RX and with personalized nutrition plan. Value goals: LDL < 43m, HDL > 40 mg.    Personal Goal Other Yes    Personal Goal Learn about diet and heart disease.    Intervention Attend cardiac rehab education sessions.    Expected Outcomes Pt will meet stated goals.              Personal Goals Discharge:  Goals and Risk Factor Review     Row Name 08/19/22 0951 09/16/22 1222 10/14/22 0925 10/30/22 1404       Core Components/Risk Factors/Patient Goals Review   Personal Goals Review Hypertension;Diabetes;Lipids;Other Hypertension;Diabetes;Lipids;Other Hypertension;Diabetes;Lipids;Other Hypertension;Diabetes;Lipids;Other    Review Patient was referred to CR with Stent placement. She has multiple risk factors for CAD and is participating in the program for risk modification. She has completed 6 sessions. Hercurrent weight is 174.4 lbs maintained since her initial visit. She is doing well in the program with progressions and consistent attendance. Her blood pressure is at goal. Her DM is managed by Actos, Ozempic, Metformin, Jardiance and glimperide. No recent A1C on file. Her personal goals for the program are to learn about nutrition and heart disease. We will continue to monitor her progress as she works towards meeting these goals. Patient  has completed 17 sessions. Her current weight is 175.1 lbs up 0.7 lbs from last 30 day review. She continues to do well in the program with progressions and consistent attendance. Her blood pressure continues to be at goal. Her DM is managed by Actos, Ozempic, Metformin, Jardiance and glimperide. No recent A1C on file. Her personal goals for the program continue to be to learn about nutrition and heart disease.  We will continue to monitor her progress as she works towards meeting these goals. Patient  has completed 26 sessions. Her current weight is 174.1 lbs down 1 lb from last 30 day review. She continues to do well in the program with progressions and consistent attendance. Her blood pressure continues to be at goal. Her DM is managed by Actos, Ozempic, Metformin, Jardiance and glimperide. Her last A1C was 6/23 at 7.3%. She saw her cardiologist 11/14 for routine follow up. She reported she felt like she was doing well in CR. No changes made.  Her personal goals for the program continue to be to learn about nutrition and heart disease. We will continue to monitor her progress as she works towards meeting these goals. Pt graduated from CR after 33 sessions. Her weight was stable while she was in the program, and her vitals were WNLs while she was in the program. Her blood sugars were within range while she was in the program. She reported to her cardiologist on 11/14 that she was benefiting from the program. She reports on her discharge survey that she learned about nutrition and exercise. She was able to decrease her MEDFICTs score by 42 points.    Expected Outcomes Patient will complete the program meeting both personal and program goals. Patient will complete the program meeting both personal and program goals. Patient will complete the program meeting both personal and program goals. Pt will continue to work towards their goals post discharge.             Exercise Goals and Review:  Exercise Goals     Row  Name 08/07/22 1131 08/26/22 1215 09/23/22 1535 10/21/22 1503       Exercise Goals   Increase Physical Activity Yes Yes Yes Yes    Intervention Provide advice, education, support and counseling about physical activity/exercise needs.;Develop an individualized exercise prescription for aerobic and resistive training based on initial evaluation findings, risk stratification, comorbidities and  participant's personal goals. Provide advice, education, support and counseling about physical activity/exercise needs.;Develop an individualized exercise prescription for aerobic and resistive training based on initial evaluation findings, risk stratification, comorbidities and participant's personal goals. Provide advice, education, support and counseling about physical activity/exercise needs.;Develop an individualized exercise prescription for aerobic and resistive training based on initial evaluation findings, risk stratification, comorbidities and participant's personal goals. Provide advice, education, support and counseling about physical activity/exercise needs.;Develop an individualized exercise prescription for aerobic and resistive training based on initial evaluation findings, risk stratification, comorbidities and participant's personal goals.    Expected Outcomes Short Term: Attend rehab on a regular basis to increase amount of physical activity.;Long Term: Add in home exercise to make exercise part of routine and to increase amount of physical activity.;Long Term: Exercising regularly at least 3-5 days a week. Short Term: Attend rehab on a regular basis to increase amount of physical activity.;Long Term: Add in home exercise to make exercise part of routine and to increase amount of physical activity.;Long Term: Exercising regularly at least 3-5 days a week. Short Term: Attend rehab on a regular basis to increase amount of physical activity.;Long Term: Add in home exercise to make exercise part of routine and to increase amount of physical activity.;Long Term: Exercising regularly at least 3-5 days a week. Short Term: Attend rehab on a regular basis to increase amount of physical activity.;Long Term: Add in home exercise to make exercise part of routine and to increase amount of physical activity.;Long Term: Exercising regularly at least 3-5 days a week.    Increase Strength and Stamina Yes Yes Yes  Yes    Intervention Provide advice, education, support and counseling about physical activity/exercise needs.;Develop an individualized exercise prescription for aerobic and resistive training based on initial evaluation findings, risk stratification, comorbidities and participant's personal goals. Provide advice, education, support and counseling about physical activity/exercise needs.;Develop an individualized exercise prescription for aerobic and resistive training based on initial evaluation findings, risk stratification, comorbidities and participant's personal goals. Provide advice, education, support and counseling about physical activity/exercise needs.;Develop an individualized exercise prescription for aerobic and resistive training based on initial evaluation findings, risk stratification, comorbidities and participant's personal goals. Provide advice, education, support and counseling about physical activity/exercise needs.;Develop an individualized exercise prescription for aerobic and resistive training based on initial evaluation findings, risk stratification, comorbidities and participant's personal goals.    Expected Outcomes Short Term: Increase workloads from initial exercise prescription for resistance, speed, and METs.;Short Term: Perform resistance training exercises routinely during rehab and add in resistance training at home;Long Term: Improve cardiorespiratory fitness, muscular endurance and strength as measured by increased METs and functional capacity (6MWT) Short Term: Increase workloads from initial exercise prescription for resistance, speed, and METs.;Short Term: Perform resistance training exercises routinely during rehab and add in resistance training at home;Long Term: Improve cardiorespiratory fitness, muscular endurance and strength as measured by increased METs and functional capacity (6MWT) Short Term: Increase workloads from initial exercise prescription for resistance,  speed, and METs.;Short Term: Perform resistance training exercises routinely during rehab and add in resistance training at home;Long Term: Improve cardiorespiratory fitness, muscular endurance and strength as measured by increased  METs and functional capacity (6MWT) Short Term: Increase workloads from initial exercise prescription for resistance, speed, and METs.;Short Term: Perform resistance training exercises routinely during rehab and add in resistance training at home;Long Term: Improve cardiorespiratory fitness, muscular endurance and strength as measured by increased METs and functional capacity (6MWT)    Able to understand and use rate of perceived exertion (RPE) scale Yes Yes Yes Yes    Intervention Provide education and explanation on how to use RPE scale Provide education and explanation on how to use RPE scale Provide education and explanation on how to use RPE scale Provide education and explanation on how to use RPE scale    Expected Outcomes Short Term: Able to use RPE daily in rehab to express subjective intensity level;Long Term:  Able to use RPE to guide intensity level when exercising independently Short Term: Able to use RPE daily in rehab to express subjective intensity level;Long Term:  Able to use RPE to guide intensity level when exercising independently Short Term: Able to use RPE daily in rehab to express subjective intensity level;Long Term:  Able to use RPE to guide intensity level when exercising independently Short Term: Able to use RPE daily in rehab to express subjective intensity level;Long Term:  Able to use RPE to guide intensity level when exercising independently    Knowledge and understanding of Target Heart Rate Range (THRR) Yes Yes Yes Yes    Intervention Provide education and explanation of THRR including how the numbers were predicted and where they are located for reference Provide education and explanation of THRR including how the numbers were predicted and where  they are located for reference Provide education and explanation of THRR including how the numbers were predicted and where they are located for reference Provide education and explanation of THRR including how the numbers were predicted and where they are located for reference    Expected Outcomes Short Term: Able to state/look up THRR;Long Term: Able to use THRR to govern intensity when exercising independently;Short Term: Able to use daily as guideline for intensity in rehab Short Term: Able to state/look up THRR;Long Term: Able to use THRR to govern intensity when exercising independently;Short Term: Able to use daily as guideline for intensity in rehab Short Term: Able to state/look up THRR;Long Term: Able to use THRR to govern intensity when exercising independently;Short Term: Able to use daily as guideline for intensity in rehab Short Term: Able to state/look up THRR;Long Term: Able to use THRR to govern intensity when exercising independently;Short Term: Able to use daily as guideline for intensity in rehab    Able to check pulse independently Yes Yes Yes Yes    Intervention Provide education and demonstration on how to check pulse in carotid and radial arteries.;Review the importance of being able to check your own pulse for safety during independent exercise Provide education and demonstration on how to check pulse in carotid and radial arteries.;Review the importance of being able to check your own pulse for safety during independent exercise Provide education and demonstration on how to check pulse in carotid and radial arteries.;Review the importance of being able to check your own pulse for safety during independent exercise Provide education and demonstration on how to check pulse in carotid and radial arteries.;Review the importance of being able to check your own pulse for safety during independent exercise    Expected Outcomes Short Term: Able to explain why pulse checking is important during  independent exercise;Long Term: Able to check pulse independently  and accurately Short Term: Able to explain why pulse checking is important during independent exercise;Long Term: Able to check pulse independently and accurately Short Term: Able to explain why pulse checking is important during independent exercise;Long Term: Able to check pulse independently and accurately Short Term: Able to explain why pulse checking is important during independent exercise;Long Term: Able to check pulse independently and accurately    Understanding of Exercise Prescription Yes Yes Yes Yes    Intervention Provide education, explanation, and written materials on patient's individual exercise prescription Provide education, explanation, and written materials on patient's individual exercise prescription Provide education, explanation, and written materials on patient's individual exercise prescription Provide education, explanation, and written materials on patient's individual exercise prescription    Expected Outcomes Short Term: Able to explain program exercise prescription;Long Term: Able to explain home exercise prescription to exercise independently Short Term: Able to explain program exercise prescription;Long Term: Able to explain home exercise prescription to exercise independently Short Term: Able to explain program exercise prescription;Long Term: Able to explain home exercise prescription to exercise independently Short Term: Able to explain program exercise prescription;Long Term: Able to explain home exercise prescription to exercise independently             Exercise Goals Re-Evaluation:  Exercise Goals Re-Evaluation     Row Name 08/26/22 1215 09/23/22 1535 10/21/22 1503         Exercise Goal Re-Evaluation   Exercise Goals Review Increase Physical Activity;Able to understand and use rate of perceived exertion (RPE) scale;Increase Strength and Stamina;Knowledge and understanding of Target Heart Rate  Range (THRR);Able to check pulse independently;Understanding of Exercise Prescription Increase Physical Activity;Increase Strength and Stamina;Able to understand and use rate of perceived exertion (RPE) scale;Knowledge and understanding of Target Heart Rate Range (THRR);Able to check pulse independently;Understanding of Exercise Prescription Increase Physical Activity;Increase Strength and Stamina;Able to understand and use rate of perceived exertion (RPE) scale;Knowledge and understanding of Target Heart Rate Range (THRR);Able to check pulse independently;Understanding of Exercise Prescription     Comments Pt has completed 10 sessions of cardiac rehab. She has been able to increase her workloads and she is motivated to progress. She reports that she is walking several days per week outside of rehab, and I reveiwed home exercise guidelines with her today. She is currently exercising at 3.91 METs on the treadmill. Will continue to monitor and progress as able. Pt has completed 21 sessions of cardiac rehab. She continues to increase her workloads. She is motivated during class and enjoys coming. She is walking on her days outside of class. She is currently exercising at 4.30 METs on the treadmill. Will continue to monitor and progress as able. Pt has completed 30 sessions of cardiac rehab. She continues tp come to class motivated and pushes herself. She is walking on her days outside of class. She is currently exercising at 4.54 METs on the treadmill. Will continue to monitor and progress as able.     Expected Outcomes Through exercise at rehab and at home, the patient will meet their stated goals. Through exercise at rehab and at home, the patient will meet their stated goals. Through exercise at rehab and at home, the patient will meet their stated goals.              Nutrition & Weight - Outcomes:  Pre Biometrics - 08/07/22 1132       Pre Biometrics   Height _0  (1.6 m)    Weight 174 lb 2.6 oz (79  kg)    Waist Circumference 40.5 inches    Hip Circumference 41 inches    Waist to Hip Ratio 0.99 %    BMI (Calculated) 30.86    Triceps Skinfold 23 mm    % Body Fat 40.2 %    Grip Strength 22.9 kg    Flexibility 11 in    Single Leg Stand 19.86 seconds             Post Biometrics - 10/28/22 1232        Post  Biometrics   Height _0  (1.6 m)    Weight 154 lb 8.7 oz (70.1 kg)    Waist Circumference 38 inches    Hip Circumference 39 inches    Waist to Hip Ratio 0.97 %    BMI (Calculated) 27.38    Triceps Skinfold 20 mm    % Body Fat 38.3 %    Grip Strength 23 kg    Flexibility 11.5 in    Single Leg Stand 60 seconds             Nutrition:  Nutrition Therapy & Goals - 08/19/22 0946       Personal Nutrition Goals   Comments Patient scored 64 on her diet assessment. We offer 2 educational sessions on heart healthy nutrition with handouts and assistance with RD referral if patient is interested.      Intervention Plan   Intervention Nutrition handout(s) given to patient.    Expected Outcomes Short Term Goal: Understand basic principles of dietary content, such as calories, fat, sodium, cholesterol and nutrients.             Nutrition Discharge:  Nutrition Assessments - 10/30/22 1400       MEDFICTS Scores   Pre Score 64    Post Score 22    Score Difference -42             Education Questionnaire Score:  Knowledge Questionnaire Score - 10/30/22 1358       Knowledge Questionnaire Score   Pre Score 23/24    Post Score 23/24             Goals reviewed with patient; copy given to patient. Pt graduated from CR after 33 sessions. She was able to improve her walk test distance by 18.5%, and her MET level was 4.54 at discharge. She reports that she will continue to exercise by walking several days per week.

## 2023-03-04 ENCOUNTER — Encounter: Payer: Self-pay | Admitting: Internal Medicine

## 2023-03-04 ENCOUNTER — Ambulatory Visit: Payer: Commercial Managed Care - PPO | Attending: Internal Medicine | Admitting: Internal Medicine

## 2023-03-04 VITALS — BP 126/78 | HR 80 | Ht 63.0 in | Wt 175.4 lb

## 2023-03-04 DIAGNOSIS — I2510 Atherosclerotic heart disease of native coronary artery without angina pectoris: Secondary | ICD-10-CM | POA: Diagnosis not present

## 2023-03-04 MED ORDER — TICAGRELOR 90 MG PO TABS: 90.0000 mg | ORAL_TABLET | Freq: Two times a day (BID) | ORAL | 0 refills | Status: AC

## 2023-03-04 NOTE — Patient Instructions (Signed)
Medication Instructions:  STOP Brilinta (August 15th, 2024)   *If you need a refill on your cardiac medications before your next appointment, please call your pharmacy*   Testing/Procedures:  Echocardiogram - Your physician has requested that you have an echocardiogram. Echocardiography is a painless test that uses sound waves to create images of your heart. It provides your doctor with information about the size and shape of your heart and how well your heart's chambers and valves are working. This procedure takes approximately one hour. There are no restrictions for this procedure.     Follow-Up: At [facility], you and your health needs are our priority.  As part of our continuing mission to provide you with exceptional heart care, we have created designated Provider Care Teams.  These Care Teams include your primary Cardiologist (physician) and Advanced Practice Providers (APPs -  Physician Assistants and Nurse Practitioners) who all work together to provide you with the care you need, when you need it.  We recommend signing up for the patient portal called "MyChart".  Sign up information is provided on this After Visit Summary.  MyChart is used to connect with patients for Virtual Visits (Telemedicine).  Patients are able to view lab/test results, encounter notes, upcoming appointments, etc.  Non-urgent messages can be sent to your provider as well.   To learn more about what you can do with MyChart, go to [email or web address].    Your next appointment:   6 month(s)  Provider:   Maisie Fus, MD

## 2023-03-04 NOTE — Progress Notes (Signed)
Cardiology Office Note:    Date:  03/04/2023   ID:  Carla Knight, DOB 09/15/1976, MRN [id number]  PCP:  Carylon Perches, MD   [facility] Providers Cardiologist:  Maisie Fus, MD     Referring MD: Carylon Perches, MD   No chief complaint on file. Cp  History of Present Illness:    Carla Knight is a 47 y.o. female with a hx of DM2, HTN, papillary thyroid s/p total thryoidectomy. CA referral for ED visit with chest pain. 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 The sensation when down her left arm. She has a lot of job stress and some anxiety related symptoms. She notes her chest continues to feel uncomfortable. Nothing makes it worse. It comes and goes lastiing 10-20 minutes. Symptoms are not just associated with activity. She smoked in the past.Her father had CABG and PCI. Mother has HTN.  In the ED, ACS was ruled out.. EKG showed NSR. No ST changes. She has no cardiac dx hx.   Blood pressure is normal.  Interim Hx 03/04/2023 Carla Knight had a significant prox to mid LAD lesion. She underwent LHC s/p onyx DES. She was started on asa and brilinta. She denies CP. Her family is doing well. No issues with medicine. She had a nose bleed, the week before Easter, no recurrence.   Cardiology Studies 06/25/2022- Coronary CTA 0.77 prox LAD  Cath: 07/02/22   CONCLUSIONS: 90% [place] 011 proximal to mid LAD stenosis with large diagonal #1. Successful single stent in LAD crossing the diagonal without compromise in reducing the stenosis to 0% with TIMI grade III flow.  Stent size 3.0 x 24 mm Onyx deployed at 14 atm. Left main is normal Circumflex and ramus are widely patent and normal Right coronary is dominant and widely patent. Normal LV function with EF greater than 65%.  LVEDP 14 mmHg.  Past Medical History:  Diagnosis Date   Diabetes mellitus    NIDDM, type 2    Dyslipidemia    Goiter    no current meds.   Hypertension    under control; has  been on med. x 5-6 yrs.   Hypothyroidism    Papillary thyroid carcinoma (HCC)    incidentally found during total thyroidectomy , neegative for metastasis   Parotid mass 11/2011   right   PONV (postoperative nausea and vomiting)    nauseated when ate after surgery   Thyroid mass     Past Surgical History:  Procedure Laterality Date   ABLATION  2016 or 2017 unsure   obgyn dr Ernestina Penna    CESAREAN SECTION  10/18/2008   CESAREAN SECTION W/BTL  04/08/2011   CORONARY STENT INTERVENTION N/A 07/02/2022   Procedure: CORONARY STENT INTERVENTION;  Surgeon: Lyn Records, MD;  [facility];  Service: Cardiovascular;  Laterality: N/A;   LAPAROSCOPIC CHOLECYSTECTOMY  06/05/2009   LEFT HEART CATH AND CORONARY ANGIOGRAPHY N/A 07/02/2022   Procedure: LEFT HEART CATH AND CORONARY ANGIOGRAPHY;  Surgeon: Lyn Records, MD;  [facility]: [facility];  Service: Cardiovascular;  Laterality: N/A;   PAROTIDECTOMY  12/17/2011   Procedure: PAROTIDECTOMY;  Surgeon: Darletta Moll, MD;  Location: [facility];  Service: ENT;  Laterality: Right;  Total right parotidectomy   ROBOTIC ASSISTED LAPAROSCOPIC HYSTERECTOMY AND SALPINGECTOMY Bilateral 05/01/2018   Procedure: XI ROBOTIC ASSISTED LAPAROSCOPIC HYSTERECTOMY AND SALPINGECTOMY;  Surgeon: Noland Fordyce, MD;  Location: [facility];  Service: Gynecology;  Laterality: Bilateral;  Requests 3 1/2 hrs.   ROBOTIC ASSISTED LAPAROSCOPIC LYSIS OF ADHESION  05/01/2018   Procedure: XI ROBOTIC ASSISTED LAPAROSCOPIC LYSIS OF ADHESION;  Surgeon: Noland Fordyce, MD;  Location: [facility];  Service: Gynecology;;   THYROIDECTOMY  05/13/2012   Procedure: THYROIDECTOMY;  Surgeon: Darletta Moll, MD;  Location: [facility];  Service: ENT;  Laterality: Bilateral;  TOTAL THYROIDECTOMY    Current Medications: No outpatient medications have been marked as taking for the 03/04/23 encounter (Appointment) with Maisie Fus, MD.     Allergies:   Adhesive [tape]   Social History    Socioeconomic History   Marital status: Married    Spouse name: Not on file   Number of children: Not on file   Years of education: Not on file   Highest education level: Not on file  Occupational History   Not on file  Tobacco Use   Smoking status: Former    Packs/day: 0.25    Years: 15.00    Additional pack years: 0.00    Total pack years: 3.75    Types: Cigarettes    Quit date: 07/02/2022    Years since quitting: 0.6   Smokeless tobacco: Never   Tobacco comments:    "im a casual mosker, every now and then"   Substance and Sexual Activity   Alcohol use: Yes    Alcohol/week: 2.0 standard drinks of alcohol    Types: 2 Glasses of wine per week    Comment: occasionally   Drug use: No   Sexual activity: Not on file  Other Topics Concern   Not on file  Social History Narrative   Not on file   Social Determinants of Health   Financial Resource Strain: Not on file  Food Insecurity: Not on file  Transportation Needs: Not on file  Physical Activity: Not on file  Stress: Not on file  Social Connections: Not on file     Family History: Father- cardiac dx per above Mother - HTN  ROS:   Please see the history of present illness.     All other systems reviewed and are negative.  EKGs/Labs/Other Studies Reviewed:    The following studies were reviewed today:   EKG:  EKG is  ordered today.  The ekg ordered today demonstrates    06/06/2022- NSR  Recent Labs: 05/06/2022: TSH 0.43 07/12/2022: BUN 7; Creatinine, Ser 0.64; Potassium 4.0; Sodium 142 08/30/2022: ALT 16 10/01/2022: Hemoglobin 13.7; Platelets 404   Recent Lipid Panel    Component Value Date/Time   CHOL 107 08/30/2022 0853   TRIG 211 (H) 08/30/2022 0853   HDL 34 (L) 08/30/2022 0853   CHOLHDL 3.1 08/30/2022 0853   CHOLHDL 4.8 07/03/2022 0622   VLDL 78 (H) 07/03/2022 0622   LDLCALC 39 08/30/2022 0853     Risk Assessment/Calculations:           Physical Exam:    VS:  Vitals:   03/04/23 0857   BP: 126/78  Pulse: 80  SpO2: 98%     Wt Readings from Last 3 Encounters:  10/28/22 154 lb 8.7 oz (70.1 kg)  10/21/22 171 lb 1.2 oz (77.6 kg)  10/07/22 173 lb 11.6 oz (78.8 kg)     GEN:  Well nourished, well developed in no acute distress HEENT: Normal NECK: No JVD; No carotid bruits LYMPHATICS: No lymphadenopathy CARDIAC: RRR, no murmurs, rubs, gallops RESPIRATORY:  Clear to auscultation without rales, wheezing or rhonchi  ABDOMEN: Soft, non-tender, non-distended MUSCULOSKELETAL:  No edema; No deformity  SKIN: Warm and dry NEUROLOGIC:  Alert and oriented x 3 PSYCHIATRIC:  Normal affect   ASSESSMENT:    CAD: p/w angina. Her symptoms were atypical, not related to activity, however with DM2 decided to get coronary CTA. She had prox-mid LAD lesion , now s/p DES to the prox-mid LAD. She has an elevated Lp(a). S/p cardiac rehab - continue asa and brilinta for 12 months; 07/03/2023 - continue lipitor 80 mg daily; vascepa (LDL 41 07/03/2022, at goal; repeat in a year) - continue jardiance 25 mg daily - continue metop XL 25 mg daily - nitro SL  HTN: well controlled. continue lisinopril 10 mg daily. BB  DM2: 7.3% 04/2022. SGLT2,  A1c goal <7  PLAN:    In order of problems listed above:  TTE Can stop brilinta 07/03/2023 Follow up 6 months     Medication Adjustments/Labs and Tests Ordered: Current medicines are reviewed at length with the patient today.  Concerns regarding medicines are outlined above.  No orders of the defined types were placed in this encounter.  No orders of the defined types were placed in this encounter.   There are no Patient Instructions on file for this visit.   Signed, Maisie Fus, MD  03/04/2023 8:54 AM    [facility]

## 2023-03-12 ENCOUNTER — Other Ambulatory Visit: Payer: Self-pay | Admitting: Internal Medicine

## 2023-03-17 ENCOUNTER — Telehealth: Payer: Self-pay | Admitting: Internal Medicine

## 2023-03-17 NOTE — Telephone Encounter (Signed)
Called patient, aware we have contacted billing to see what is needed to correct.

## 2023-03-17 NOTE — Telephone Encounter (Signed)
Message forwarded (via email) to Nyoka Cowden for her assistance.  She is over hospital billing and the claim in question from the hospital.

## 2023-03-17 NOTE — Telephone Encounter (Signed)
Patient had a scan on 06/25/22 and she was given an estimated cost around $1,100. Patient had received pre authorization for the test, [facility] hospital stated that there was 2 codes added after the pre authorization. Patient stated that the two codes were 0502T and 0503T, both of these codes state that they are experimental. Patient's insurance company is denying both of these codes. Patient is now having a bill of $6,500 Patient is wanting to know why these codes were added after the pre authorization was approved. Patient has already spoken to billing already and was informed the provider is the one that would have to change these codes. Please advise.

## 2023-04-01 NOTE — Telephone Encounter (Signed)
Patient states when she spoke with billing she was informed that the cpt codes provided by Dr. Wyline Mood after procedure was completing is why there is a price different from what was submitted to insurance prior to insurance. Insurance denied claim because the codes chosen says the procedure is experimental. So billing is not able to change the codes she needs to speak to her provider about the codes she chose.

## 2023-04-01 NOTE — Telephone Encounter (Signed)
Patient is calling to get update on this billing issue. Requesting call back.

## 2023-04-02 ENCOUNTER — Encounter: Payer: Self-pay | Admitting: Internal Medicine

## 2023-04-02 ENCOUNTER — Ambulatory Visit (HOSPITAL_COMMUNITY)
Admission: RE | Admit: 2023-04-02 | Discharge: 2023-04-02 | Disposition: A | Discharge status: Home / Self Care | Payer: Commercial Managed Care - PPO | Source: Ambulatory Visit | Attending: Internal Medicine | Admitting: Internal Medicine

## 2023-04-02 DIAGNOSIS — I2510 Atherosclerotic heart disease of native coronary artery without angina pectoris: Secondary | ICD-10-CM | POA: Insufficient documentation

## 2023-04-02 LAB — ECHOCARDIOGRAM COMPLETE
Area-P 1/2: 1.94 cm2
S' Lateral: 2.2 cm

## 2023-04-02 NOTE — Progress Notes (Signed)
*  PRELIMINARY RESULTS* Echocardiogram 2D Echocardiogram has been performed.  Stacey Drain 04/02/2023, 10:15 AM

## 2023-04-03 NOTE — Telephone Encounter (Signed)
Please see MyChart encounter.

## 2023-04-15 ENCOUNTER — Other Ambulatory Visit: Payer: Self-pay | Admitting: *Deleted

## 2023-04-15 MED ORDER — ATORVASTATIN CALCIUM 80 MG PO TABS: 80.0000 mg | ORAL_TABLET | Freq: Every evening | ORAL | 3 refills | Status: DC

## 2023-04-15 MED ORDER — METOPROLOL SUCCINATE ER 25 MG PO TB24: 25.0000 mg | ORAL_TABLET | Freq: Every day | ORAL | 11 refills | Status: DC

## 2023-05-05 ENCOUNTER — Ambulatory Visit: Payer: Commercial Managed Care - PPO | Admitting: Internal Medicine

## 2023-05-13 ENCOUNTER — Ambulatory Visit (INDEPENDENT_AMBULATORY_CARE_PROVIDER_SITE_OTHER): Payer: Commercial Managed Care - PPO | Admitting: Internal Medicine

## 2023-05-13 ENCOUNTER — Encounter: Payer: Self-pay | Admitting: Internal Medicine

## 2023-05-13 VITALS — BP 124/78 | HR 78 | Ht 63.0 in | Wt 175.0 lb

## 2023-05-13 DIAGNOSIS — E890 Postprocedural hypothyroidism: Secondary | ICD-10-CM | POA: Diagnosis not present

## 2023-05-13 DIAGNOSIS — Z85850 Personal history of malignant neoplasm of thyroid: Secondary | ICD-10-CM | POA: Diagnosis not present

## 2023-05-13 LAB — TSH: TSH: 1.08 u[IU]/mL (ref 0.35–5.50)

## 2023-05-13 MED ORDER — SYNTHROID 150 MCG PO TABS: 150.0000 ug | ORAL_TABLET | Freq: Every day | ORAL | 3 refills | Status: DC

## 2023-05-13 NOTE — Progress Notes (Signed)
Name: Carla Knight  MRN/ DOB: [id number], 11/07/1976    Age/ Sex: 47 y.o., female    PCP: Carylon Perches, MD   Reason for Endocrinology Evaluation: PTC/Hypothyroidism     Date of Initial Endocrinology Evaluation: 10/31/2021    HPI: Carla Knight is a 47 y.o. female with a past medical history of T2DM, Postoperative Hypothyroidism and HTN. The patient presented for initial endocrinology clinic visit on 10/31/2021 for consultative assistance with her PTC/Hypothyroidism.   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  S/P Thyrogen - stimulated RAI  of 54.8 mCi   Post-radioiodine whole body scan on 08/03/12 showed findings consistent with residual functioning thyroid tissue within the thyroid bed, with no evidence for distant metastatic disease.    S/P excision of benign right parotid mass 12/17/2011   Mother with thyroid disease    SUBJECTIVE:    Today (05/13/23):  Carla Knight is here for a follow up on Hx of PTC and postoperative Hypothyroidism   Denies local neck symptoms  Denies constipation or diarrhea  Denies palpitations  Denies tremors  No biotin intake  Synthroid 150 mcg daily       HISTORY:  Past Medical History:  Past Medical History:  Diagnosis Date   Diabetes mellitus    NIDDM, type 2    Dyslipidemia    Goiter    no current meds.   Hypertension    under control; has been on med. x 5-6 yrs.   Hypothyroidism    Papillary thyroid carcinoma (HCC)    incidentally found during total thyroidectomy , neegative for metastasis   Parotid mass 11/2011   right   PONV (postoperative nausea and vomiting)    nauseated when ate after surgery   Thyroid mass    Past Surgical History:  Past Surgical History:  Procedure Laterality Date   ABLATION  2016 or 2017 unsure   obgyn dr Ernestina Penna    CESAREAN SECTION  10/18/2008   CESAREAN SECTION W/BTL  04/08/2011   CORONARY STENT INTERVENTION  N/A 07/02/2022   Procedure: CORONARY STENT INTERVENTION;  Surgeon: Lyn Records, MD;  [facility]: [facility];  Service: Cardiovascular;  Laterality: N/A;   LAPAROSCOPIC CHOLECYSTECTOMY  06/05/2009   LEFT HEART CATH AND CORONARY ANGIOGRAPHY N/A 07/02/2022   Procedure: LEFT HEART CATH AND CORONARY ANGIOGRAPHY;  Surgeon: Lyn Records, MD;  [facility]: [facility];  Service: Cardiovascular;  Laterality: N/A;   PAROTIDECTOMY  12/17/2011   Procedure: PAROTIDECTOMY;  Surgeon: Darletta Moll, MD;  Location: [facility];  Service: ENT;  Laterality: Right;  Total right parotidectomy   ROBOTIC ASSISTED LAPAROSCOPIC HYSTERECTOMY AND SALPINGECTOMY Bilateral 05/01/2018   Procedure: XI ROBOTIC ASSISTED LAPAROSCOPIC HYSTERECTOMY AND SALPINGECTOMY;  Surgeon: Noland Fordyce, MD;  Location: [facility];  Service: Gynecology;  Laterality: Bilateral;  Requests 3 1/2 hrs.   ROBOTIC ASSISTED LAPAROSCOPIC LYSIS OF ADHESION  05/01/2018   Procedure: XI ROBOTIC ASSISTED LAPAROSCOPIC LYSIS OF ADHESION;  Surgeon: Noland Fordyce, MD;  Location: [facility];  Service: Gynecology;;   THYROIDECTOMY  05/13/2012   Procedure: THYROIDECTOMY;  Surgeon: Darletta Moll, MD;  Location: [facility];  Service: ENT;  Laterality: Bilateral;  TOTAL THYROIDECTOMY    Social History:  reports that she quit smoking about 10 months ago. Her smoking use included cigarettes. She has a 3.75 pack-year smoking history. She has never used smokeless tobacco. She reports current alcohol use  of about 2.0 standard drinks of alcohol per week. She reports that she does not use drugs. Family History: family history is not on file.   HOME MEDICATIONS: Allergies as of 05/13/2023       Reactions   Adhesive [tape] Hives        Medication List        Accurate as of May 13, 2023  9:22 AM. If you have any questions, ask your nurse or doctor.          acetaminophen 500 MG tablet Commonly known as: TYLENOL Take 500 mg by mouth every 8 (eight) hours  as needed for headache or moderate pain.   aspirin EC 81 MG tablet Take 1 tablet (81 mg total) by mouth daily. Swallow whole.   atorvastatin 80 MG tablet Commonly known as: LIPITOR Take 1 tablet (80 mg total) by mouth every evening.   Childrens Gummies Kulm 2 tablets by mouth every evening.   glimepiride 2 MG tablet Commonly known as: AMARYL Take 2 mg by mouth daily with breakfast.   icosapent Ethyl 1 g capsule Commonly known as: Vascepa Take 2 capsules (2 g total) by mouth 2 (two) times daily.   Jardiance 25 MG Tabs tablet Generic drug: empagliflozin Take 25 mg by mouth daily.   ketotifen 0.025 % ophthalmic solution Commonly known as: ZADITOR Place 1 drop into both eyes daily as needed (allergies).   lisinopril 10 MG tablet Commonly known as: ZESTRIL Take 10 mg by mouth every morning.   metFORMIN 1000 MG tablet Commonly known as: GLUCOPHAGE Take 1 tablet (1,000 mg total) by mouth 2 (two) times daily.   metoprolol succinate 25 MG 24 hr tablet Commonly known as: Toprol XL Take 1 tablet (25 mg total) by mouth daily.   nitroGLYCERIN 0.4 MG SL tablet Commonly known as: NITROSTAT Place 1 tablet (0.4 mg total) under the tongue every 5 (five) minutes as needed for chest pain.   Ozempic (0.25 or 0.5 MG/DOSE) 2 MG/3ML Sopn Generic drug: Semaglutide(0.25 or 0.5MG /DOS) Inject 0.5 mg into the skin every Sunday.   pioglitazone 15 MG tablet Commonly known as: ACTOS Take 15 mg by mouth daily.   Synthroid 150 MCG tablet Generic drug: levothyroxine TAKE 1 TABLET DAILY BEFORE BREAKFAST   ticagrelor 90 MG Tabs tablet Commonly known as: BRILINTA Take 1 tablet (90 mg total) by mouth 2 (two) times daily.          REVIEW OF SYSTEMS: A comprehensive ROS was conducted with the patient and is negative except as per HPI     OBJECTIVE:  VS: BP 124/78 (BP Location: Left Arm, Patient Position: Sitting, Cuff Size: Large)   Pulse 78   Ht 5\' 3"  (1.6 m)   Wt 175 lb (79.4  kg)   SpO2 99%   BMI 31.00 kg/m    Wt Readings from Last 3 Encounters:  05/13/23 175 lb (79.4 kg)  03/04/23 175 lb 6.4 oz (79.6 kg)  10/28/22 154 lb 8.7 oz (70.1 kg)     EXAM: General: Pt appears well and is in NAD  Neck: General: Supple without adenopathy. Thyroid: Thyroid size normal.  No goiter or nodules appreciated. No thyroid bruit.  Lungs: Clear with good BS bilat with no rales, rhonchi, or wheezes  Heart: Auscultation: RRR.  Abdomen: Normoactive bowel sounds, soft, nontender, without masses or organomegaly palpable  Extremities:  BL LE: No pretibial edema normal ROM and strength.  Mental Status: Judgment, insight: Intact Orientation: Oriented to time, place, and  person Mood and affect: No depression, anxiety, or agitation     DATA REVIEWED:   Latest Reference Range & Units 05/13/23 10:14  TSH 0.35 - 5.50 uIU/mL 1.08      Thyroid Ultrasound 11/21/2021  Isthmus: Surgically absent. There is no residual nodular soft tissue within the isthmic resection bed.   Right lobe: Surgically absent. There is no residual nodular soft tissue within the right lobectomy resection bed.   Left lobe: Surgically absent. There is no residual nodular soft tissue within the left lobectomy resection bed.   _________________________________________________________   No regional cervical lymphadenopathy.   IMPRESSION: Post total thyroidectomy without evidence of residual or locally recurrent disease.   ASSESSMENT/PLAN/RECOMMENDATIONS:   Hx of PTC , Stage I :  - She is S/P total thyroidectomy in 04/2012 with a 4 cm PTC , follicular variant. She is S/P remnant ablation with 54.8 mCi of I-131  - Pt with excellent structural and biochemical response at this time  - Tg and Tg Ab have been undetectable in the past, pending today  - Thyroid bed ultrasound 11/2021 showed no evidence of recurrence, would not repeat ultrasound unless there is a concern about elevated TG - TSH goal  0.5-2.0 uIU/mL      2. Postoperative Hypothyroidism :  - Pt is clinically euthyroid  - No local neck symptoms  - She is on Brand name Synthroid and gets this from [facility] -TSH is within normal goal  Medications :  Continue Synthroid 150 mcg daily    F/U in 1 yr   Signed electronically by: Lyndle Herrlich, MD  [facility] Endocrinology  [facility] [address] [place] [place], [place] [address] Phone: [phone] FAX: [phone]   CC: Carylon Perches, MD [address] [place] [address] Phone: [phone] Fax: [phone]   Return to Endocrinology clinic as below: Future Appointments  Date Time Provider Department Center  05/13/2023  9:30 AM [place] Kuang, Konrad Dolores, MD [id number] None

## 2023-05-14 LAB — THYROGLOBULIN LEVEL: Thyroglobulin: 0.1 ng/mL — ABNORMAL LOW

## 2023-05-14 LAB — THYROGLOBULIN ANTIBODY: Thyroglobulin Ab: <1 IU/mL (ref <=1)

## 2023-06-30 ENCOUNTER — Other Ambulatory Visit: Payer: Self-pay | Admitting: Internal Medicine

## 2023-06-30 ENCOUNTER — Telehealth: Payer: Self-pay | Admitting: Internal Medicine

## 2023-06-30 ENCOUNTER — Telehealth: Payer: Self-pay

## 2023-06-30 DIAGNOSIS — Z01812 Encounter for preprocedural laboratory examination: Secondary | ICD-10-CM

## 2023-06-30 DIAGNOSIS — R079 Chest pain, unspecified: Secondary | ICD-10-CM

## 2023-06-30 MED ORDER — NITROGLYCERIN 0.4 MG SL SUBL
0.4000 mg | SUBLINGUAL_TABLET | SUBLINGUAL | 3 refills | Status: AC | PRN
Start: 2023-06-30 — End: 2024-09-06

## 2023-06-30 NOTE — Telephone Encounter (Signed)
Pt c/o of Chest Pain: STAT if CP now or developed within 24 hours  1. Are you having CP right now? No  2. Are you experiencing any other symptoms (ex. SOB, nausea, vomiting, sweating)? No  3. How long have you been experiencing CP? Past 2 weeks   4. Is your CP continuous or coming and going? Coming and going  5. Have you taken Nitroglycerin? No ?

## 2023-06-30 NOTE — Telephone Encounter (Signed)
Called [name] and she reports persistent chest pressure similar to the CP she had prior to her LHC. S/p prox-mid LAD PCI; D1 was crossed but not jailed. She is still on DAPT.   Shared Decision Making/Informed Consent The risks [stroke (1 in 1000), death (1 in 1000), kidney failure [usually temporary] (1 in 500), bleeding (1 in 200), allergic reaction [possibly serious] (1 in 200)], benefits (diagnostic support and management of coronary artery disease) and alternatives of a cardiac catheterization were discussed in detail with Carla Knight and she is willing to proceed.

## 2023-06-30 NOTE — Telephone Encounter (Signed)
Called pt to get cath scheduled. Per Dr. Verna Czech request. Cath is scheduled for Monday August 19th with Dr. Herbie Baltimore. Calling pt to go over instructions. Answered all questions expressed at this time. Pt verbalized understanding. She will have labs drawn this week.   Message from Dr. Wyline Mood: Gus Puma and [place], can you please schedule Mrs. Perkey for a LHC. She may need an APP visit this week prior to, for an H&P. Thank  you !

## 2023-06-30 NOTE — Telephone Encounter (Signed)
Returned call to pt. She has had chest pain for 2 weeks now that comes and goes. Pt had a stent a year ago and the symptoms feel the same as then. CP only lasting less than a minute and its not all day every day. When it happens it does repeat throughout the day. No SOB, HA or dizziness when it happens. No fatigue. The CP is like a pressure that goes to the back of her throat and her left arm is bothered as well, it is just uncomfortable. She has had Covid recently as well and that's when it started. Her nitroglycerin has been opened and the pills spilled out. She needs a new refill as well.

## 2023-07-03 ENCOUNTER — Encounter: Payer: Self-pay | Admitting: *Deleted

## 2023-07-03 NOTE — H&P (View-Only) (Signed)
Cardiology Office Note:   Date:  07/04/2023  ID:  Carla Knight, DOB 04-26-1976, MRN [id number] PCP:  Carylon Perches, MD  [facility] Providers Cardiologist:  Alverda Skeans, MD Referring MD: Carylon Perches, MD   Chief Complaint/Reason for Referral:  Preparation for coronary angiography ASSESSMENT:    1. Angina pectoris (HCC)   2. Type 2 diabetes mellitus with other circulatory complication, with long-term current use of insulin (HCC)   3. Hypertension associated with diabetes (HCC)   4. Hyperlipidemia associated with type 2 diabetes mellitus (HCC)     PLAN:   In order of problems listed above: 1.  Chest pain: The patient is having anginal symptoms reminiscent of her previous symptoms which led to her PCI procedure.  There are definitely some atypical and typical aspects to her pain.  She is on 2 antianginal agents.  She remains on aspirin and Brilinta.  I have asked her to continue these medications.  We will proceed to coronary angiography for definitive assessment next week.  Her right radial pulse is +2.  I have reviewed the risks, indications, and alternatives to cardiac catheterization, possible angioplasty, and stenting with the patient. Risks include but are not limited to bleeding, infection, vascular injury, stroke, myocardial infection, arrhythmia, kidney injury, radiation-related injury in the case of prolonged fluoroscopy use, emergency cardiac surgery, and death. The patient understands the risks of serious complication is 1-2 in 1000 with diagnostic cardiac cath and 1-2% or less with angioplasty/stenting.   2.  Type 2 diabetes: Continue aspirin, statin, Jardiance, and lisinopril 3.  Hypertension:  BP well controlled 4.  Hyperlipidemia: Last lipid panel demonstrated an LDL of 39.             Dispo:  As previously scheduled     Medication Adjustments/Labs and Tests Ordered: Current medicines are reviewed at length with the patient today.  Concerns regarding medicines  are outlined above.  The following changes have been made:  no change   Labs/tests ordered: Orders Placed This Encounter  Procedures   EKG 12-Lead    Medication Changes: No orders of the defined types were placed in this encounter.   Current medicines are reviewed at length with the patient today.  The patient does not have concerns regarding medicines.  History of Present Illness:   FOCUSED PROBLEM LIST:   CAD s/p PCI pLAD 2023 Hypertension Hyperlipidemia T2DM on metformin Papillary thyroid carcinoma s/p thyroidectomy  The patient is a Carla Knight with the indicated medical history here for for a preprocedure assessment prior to elective coronary angiography next week.  She has been cared for by Dr. Wyline Mood.  She was seen by Dr. Wyline Mood in April at that time she was doing fairly well.  She contacted Dr. Verna Czech office earlier this month with reports of persistent chest pressure quite similar to her anginal symptoms that resulted in coronary angiography and PCI of her LAD.  Because of the symptoms she was started on nitroglycerin referred for coronary angiography which is to be done next week.  The patient tells me that she develops a chest fullness that radiates to her neck and down her left arm.  This can happen with and without exertion.  It does not happen reliably with exertion.  These are exactly the same symptoms that she developed previously that ended up leading to her PCI.  However she has also been recuperating from a COVID infection and she has had some shortness of breath with this.  She is unsure  if the symptoms she is feeling are related to her previous COVID infection or if this represents recurrent anginal symptoms.  She remains on dual antiplatelet therapy consisting of aspirin and Brilinta.  She has had no myalgias while on Lipitor.  She has had no severe bleeding while on DAPT.         Current Medications: Current Meds  Medication Sig   acetaminophen (TYLENOL)  500 MG tablet Take 500 mg by mouth every 8 (eight) hours as needed for headache or moderate pain.   aspirin EC 81 MG tablet Take 1 tablet (81 mg total) by mouth daily. Swallow whole.   atorvastatin (LIPITOR) 80 MG tablet Take 1 tablet (80 mg total) by mouth every evening.   glimepiride (AMARYL) 2 MG tablet Take 2 mg by mouth daily with breakfast.   icosapent Ethyl (VASCEPA) 1 g capsule Take 2 capsules (2 g total) by mouth 2 (two) times daily.   JARDIANCE 25 MG TABS tablet Take 25 mg by mouth daily.   ketotifen (ZADITOR) 0.025 % ophthalmic solution Place 1 drop into both eyes daily as needed (allergies).   lisinopril (PRINIVIL,ZESTRIL) 10 MG tablet Take 10 mg by mouth every morning.    Magnesium 200 MG TABS Take 200 mg by mouth daily.   metFORMIN (GLUCOPHAGE) 1000 MG tablet Take 1 tablet (1,000 mg total) by mouth 2 (two) times daily.   metoprolol succinate (TOPROL XL) 25 MG 24 hr tablet Take 1 tablet (25 mg total) by mouth daily.   nitroGLYCERIN (NITROSTAT) 0.4 MG SL tablet Place 1 tablet (0.4 mg total) under the tongue every 5 (five) minutes as needed for chest pain.   OZEMPIC, 0.25 OR 0.5 MG/DOSE, 2 MG/3ML SOPN Inject 0.5 mg into the skin every Sunday.   Pediatric Multivit-Minerals-C (CHILDRENS GUMMIES) CHEW Chew 2 tablets by mouth every evening. Gummy   pioglitazone (ACTOS) 15 MG tablet Take 15 mg by mouth daily.   SYNTHROID 150 MCG tablet Take 1 tablet (150 mcg total) by mouth daily before breakfast.   ticagrelor (BRILINTA) 90 MG TABS tablet Take 90 mg by mouth 2 (two) times daily.     Allergies:    Adhesive [tape]   Social History:   Social History   Tobacco Use   Smoking status: Former    Current packs/day: 0.00    Average packs/day: 0.3 packs/day for 15.0 years (3.8 ttl pk-yrs)    Types: Cigarettes    Start date: 07/03/2007    Quit date: 07/02/2022    Years since quitting: 1.0   Smokeless tobacco: Never   Tobacco comments:    "im a casual mosker, every now and then"    Substance Use Topics   Alcohol use: Yes    Alcohol/week: 2.0 standard drinks of alcohol    Types: 2 Glasses of wine per week    Comment: occasionally   Drug use: No     Family Hx: History reviewed. No pertinent family history.   Review of Systems:   Please see the history of present illness.    All other systems reviewed and are negative.     EKGs/Labs/Other Test Reviewed:   EKG: EKG done today that I reviewed demonstrates normal sinus rhythm with nonspecific ST and T wave abnormalities.  EKG Interpretation Date/Time:    Ventricular Rate:    PR Interval:    QRS Duration:    QT Interval:    QTC Calculation:   R Axis:      Text Interpretation:  Prior CV studies reviewed: Cardiac Studies & Procedures   CARDIAC CATHETERIZATION  CARDIAC CATHETERIZATION 07/02/2022  Narrative CONCLUSIONS: 90% [place] 011 proximal to mid LAD stenosis with large diagonal #1. Successful single stent in LAD crossing the diagonal without compromise in reducing the stenosis to 0% with TIMI grade III flow.  Stent size 3.0 x 24 mm Onyx deployed at 14 atm. Left main is normal Circumflex and ramus are widely patent and normal Right coronary is dominant and widely patent. Normal LV function with EF greater than 65%.  LVEDP 14 mmHg.  RECOMMENDATIONS:  Intensify antilipid therapy Aspirin and Brilinta x12 months. Home in a.m. if no chest discomfort or problems.  Findings Coronary Findings Diagnostic  Dominance: Right  Left Anterior Descending Prox LAD to Mid LAD lesion is 90% stenosed.  First Diagonal Branch 1st Diag lesion is 30% stenosed.  Intervention  Prox LAD to Mid LAD lesion Stent A stent was successfully placed. Post-Intervention Lesion Assessment The intervention was successful. Pre-interventional TIMI flow is 3. Post-intervention TIMI flow is 3. No complications occurred at this lesion. There is a 0% residual stenosis post intervention.      ECHOCARDIOGRAM  ECHOCARDIOGRAM COMPLETE 04/02/2023  Narrative ECHOCARDIOGRAM REPORT    Patient Name:   ROMILLY ALEXIOU Date of Exam: 04/02/2023 Medical Rec #:  [id number]         Height:       63.0 in Accession #:    [id number]        Weight:       175.4 lb Date of Birth:  Jul 21, 1976         BSA:          1.829 m Patient Age:    Carla years          BP:           132/85 mmHg Patient Gender: F                 HR:           83 bpm. [facility]  Procedure: 2D Echo, Cardiac Doppler and Color Doppler  Indications:    CAD (coronary artery disease) [[id number]]  History:        Patient has no prior history of Echocardiogram examinations. Risk Factors:Hypertension, Diabetes, Dyslipidemia and Former Smoker. Hx of COVID-19. Patient states she is s/p cardiac stenting.  Sonographer:    Celesta Gentile RCS Referring Phys: [phone] MARY E BRANCH  IMPRESSIONS   1. Left ventricular ejection fraction, by estimation, is 60 to 65%. The left ventricle has normal function. The left ventricle has no regional wall motion abnormalities. Indeterminate diastolic filling due to E-A fusion. 2. Right ventricular systolic function is normal. The right ventricular size is normal. Tricuspid regurgitation signal is inadequate for assessing PA pressure. 3. The mitral valve is abnormal. No evidence of mitral valve regurgitation. No evidence of mitral stenosis. Moderate mitral annular calcification. 4. The aortic valve was not well visualized. Aortic valve regurgitation is not visualized. No aortic stenosis is present. 5. The inferior vena cava is normal in size with greater than 50% respiratory variability, suggesting right atrial pressure of 3 mmHg.  Comparison(s): No prior Echocardiogram.  FINDINGS Left Ventricle: Left ventricular ejection fraction, by estimation, is 60 to 65%. The left ventricle has normal function. The left ventricle has no regional wall motion abnormalities. The left ventricular  internal cavity size was normal in size. There is no left ventricular hypertrophy. Indeterminate diastolic filling due to E-A  fusion.  Right Ventricle: The right ventricular size is normal. No increase in right ventricular wall thickness. Right ventricular systolic function is normal. Tricuspid regurgitation signal is inadequate for assessing PA pressure.  Left Atrium: Left atrial size was normal in size.  Right Atrium: Right atrial size was normal in size.  Pericardium: There is no evidence of pericardial effusion.  Mitral Valve: The mitral valve is abnormal. Moderate mitral annular calcification. No evidence of mitral valve regurgitation. No evidence of mitral valve stenosis.  Tricuspid Valve: The tricuspid valve is grossly normal. Tricuspid valve regurgitation is not demonstrated. No evidence of tricuspid stenosis.  Aortic Valve: The aortic valve was not well visualized. Aortic valve regurgitation is not visualized. No aortic stenosis is present.  Pulmonic Valve: The pulmonic valve was normal in structure. Pulmonic valve regurgitation is trivial. No evidence of pulmonic stenosis.  Aorta: The aortic root is normal in size and structure.  Venous: The inferior vena cava is normal in size with greater than 50% respiratory variability, suggesting right atrial pressure of 3 mmHg.  IAS/Shunts: No atrial level shunt detected by color flow Doppler.   LEFT VENTRICLE PLAX 2D LVIDd:         4.00 cm   Diastology LVIDs:         2.20 cm   LV e' medial:    9.36 cm/s LV PW:         1.00 cm   LV E/e' medial:  9.6 LV IVS:        1.00 cm   LV e' lateral:   9.36 cm/s LVOT diam:     1.90 cm   LV E/e' lateral: 9.6 LV SV:         61 LV SV Index:   33 LVOT Area:     2.84 cm   RIGHT VENTRICLE RV S prime:     12.60 cm/s TAPSE (M-mode): 2.3 cm  LEFT ATRIUM           Index        RIGHT ATRIUM           Index LA diam:      3.50 cm 1.91 cm/m   RA Area:     15.50 cm LA Vol (A2C): 38.2 ml 20.89  ml/m  RA Volume:   36.00 ml  19.69 ml/m LA Vol (A4C): 50.6 ml 27.67 ml/m AORTIC VALVE LVOT Vmax:   99.40 cm/s LVOT Vmean:  72.950 cm/s LVOT VTI:    0.216 m  AORTA Ao Root diam: 3.00 cm  MITRAL VALVE MV Area (PHT): 1.94 cm    SHUNTS MV Decel Time: 391 msec    Systemic VTI:  0.22 m MV E velocity: 90.20 cm/s  Systemic Diam: 1.90 cm MV A velocity: 87.10 cm/s MV E/A ratio:  1.04  Vishnu Priya Mallipeddi Electronically signed by Winfield Rast Mallipeddi Signature Date/Time: 04/02/2023/2:56:23 PM    Final     CT SCANS  CT CORONARY MORPH W/CTA COR W/SCORE 06/25/2022  Addendum 06/25/2022  3:58 PM ADDENDUM REPORT: 06/25/2022 15:56  CLINICAL DATA:  19F with chest pain  EXAM: Cardiac/Coronary CTA  TECHNIQUE: The patient was scanned on a [employer].  FINDINGS: A 100 kV prospective scan was triggered in the descending thoracic aorta at 111 HU's. Axial non-contrast 3 mm slices were carried out through the heart. The data set was analyzed on a dedicated work station and scored using the Agatson method. Gantry rotation speed was 250 msecs and collimation was .6 mm. 0.8  mg of sl NTG was given. The 3D data set was reconstructed in 5% intervals of the 35-75 % of the R-R cycle. Phases were analyzed on a dedicated work station using MPR, MIP and VRT modes. The patient received 100 cc of contrast.  Coronary Arteries:  Normal coronary origin.  Right dominance.  RCA is a large dominant artery that gives rise to PDA and PLA. Calcified plaque in the mid RCA causes 0-24% stenosis  Left main is a large artery that gives rise to LAD and LCX arteries.  LAD is a large vessel. Mixed plaque in the proximal LAD causes severe (70-99%) stenosis  LCX is a non-dominant artery that gives rise to one large OM1 branch. There is no plaque.  Ramus has no plaque.  Other findings:  Left Ventricle: Normal size  Left Atrium: Normal size  Pulmonary Veins: Normal  configuration  Right Ventricle: Normal size  Right Atrium: Normal size  Cardiac valves: No calcifications  Thoracic aorta: Normal size  Pulmonary Arteries: Normal size  Systemic Veins: Normal drainage  Pericardium: Normal thickness  IMPRESSION: 1. Coronary calcium score of 75. This was 99th percentile for age and sex matched control.  2. Normal coronary origin with right dominance.  3. Obstructive CAD  4. Mixed plaque in the proximal LAD causes severe (70-99%) stenosis  5. Cardiac catheterization recommended  CAD-RADS 4 Severe stenosis. (70-99% or > 50% left main). Cardiac catheterization or CT FFR is recommended. Consider symptom-guided anti-ischemic pharmacotherapy as well as risk factor modification per guideline directed care.   Electronically Signed By: Epifanio Lesches M.D. On: 06/25/2022 15:56  Narrative EXAM: OVER-READ INTERPRETATION  CT CHEST  The following report is a limited chest CT over-read performed by radiologist Dr. Lupita Raider of [facility] on 06/25/2022.The coronary CTA interpretation by the cardiologist is attached.  COMPARISON:  None Available.  FINDINGS: Vascular: Visualized extracardiac vascular structures are unremarkable.  Mediastinum/Nodes: Visualized mediastinum is unremarkable.  Lungs/Pleura: Visualized pulmonary parenchyma is unremarkable.  Upper Abdomen: Visualized upper abdomen is unremarkable.  Musculoskeletal: No chest wall mass or suspicious bone lesions identified.  IMPRESSION: No definite abnormality seen involving the visualized extracardiac structures of the chest.  Electronically Signed: By: Lupita Raider M.D. On: 06/25/2022 14:14          Recent Labs: 08/30/2022: ALT 16 05/13/2023: TSH 1.08 07/02/2023: BUN 12; Creatinine, Ser 0.60; Hemoglobin 14.1; Platelets 391; Potassium 4.2; Sodium 139   Lipid Panel    Component Value Date/Time   CHOL 107 08/30/2022 0853   TRIG 211 (H)  08/30/2022 0853   HDL 34 (L) 08/30/2022 0853   CHOLHDL 3.1 08/30/2022 0853   CHOLHDL 4.8 07/03/2022 0622   VLDL 78 (H) 07/03/2022 0622   LDLCALC 39 08/30/2022 0853    Risk Assessment/Calculations:          Physical Exam:   VS:  BP 130/82   Pulse 91   Ht 5\' 3"  (1.6 m)   Wt 178 lb 3.2 oz (80.8 kg)   LMP 04/29/2016 Comment: LMP was over 2 years ago pre-ablation   SpO2 91%   BMI 31.57 kg/m        Wt Readings from Last 3 Encounters:  07/04/23 178 lb 3.2 oz (80.8 kg)  05/13/23 175 lb (79.4 kg)  03/04/23 175 lb 6.4 oz (79.6 kg)      GENERAL:  No apparent distress, AOx3 HEENT:  No carotid bruits, +2 carotid impulses, no scleral icterus CAR: RRR no murmurs, gallops, rubs, or  thrills RES:  Clear to auscultation bilaterally ABD:  Soft, nontender, nondistended, positive bowel sounds x 4 VASC:  +2 radial pulses, +2 carotid pulses NEURO:  CN 2-12 grossly intact; motor and sensory grossly intact PSYCH:  No active depression or anxiety EXT:  No edema, ecchymosis, or cyanosis  Signed, Orbie Pyo, MD  07/04/2023 10:05 PM    [facility] [address] [place], [place], [place]  [address] Phone: [phone]; Fax: [phone]   Note:  This document was prepared using Dragon voice recognition software and may include unintentional dictation errors.

## 2023-07-03 NOTE — Progress Notes (Signed)
Cardiology Office Note:   Date:  07/04/2023  ID:  Carla Knight, DOB 04-26-1976, MRN [id number] PCP:  Carylon Perches, MD  [facility] Providers Cardiologist:  Alverda Skeans, MD Referring MD: Carylon Perches, MD   Chief Complaint/Reason for Referral:  Preparation for coronary angiography ASSESSMENT:    1. Angina pectoris (HCC)   2. Type 2 diabetes mellitus with other circulatory complication, with long-term current use of insulin (HCC)   3. Hypertension associated with diabetes (HCC)   4. Hyperlipidemia associated with type 2 diabetes mellitus (HCC)     PLAN:   In order of problems listed above: 1.  Chest pain: The patient is having anginal symptoms reminiscent of her previous symptoms which led to her PCI procedure.  There are definitely some atypical and typical aspects to her pain.  She is on 2 antianginal agents.  She remains on aspirin and Brilinta.  I have asked her to continue these medications.  We will proceed to coronary angiography for definitive assessment next week.  Her right radial pulse is +2.  I have reviewed the risks, indications, and alternatives to cardiac catheterization, possible angioplasty, and stenting with the patient. Risks include but are not limited to bleeding, infection, vascular injury, stroke, myocardial infection, arrhythmia, kidney injury, radiation-related injury in the case of prolonged fluoroscopy use, emergency cardiac surgery, and death. The patient understands the risks of serious complication is 1-2 in 1000 with diagnostic cardiac cath and 1-2% or less with angioplasty/stenting.   2.  Type 2 diabetes: Continue aspirin, statin, Jardiance, and lisinopril 3.  Hypertension:  BP well controlled 4.  Hyperlipidemia: Last lipid panel demonstrated an LDL of 39.             Dispo:  As previously scheduled     Medication Adjustments/Labs and Tests Ordered: Current medicines are reviewed at length with the patient today.  Concerns regarding medicines  are outlined above.  The following changes have been made:  no change   Labs/tests ordered: Orders Placed This Encounter  Procedures   EKG 12-Lead    Medication Changes: No orders of the defined types were placed in this encounter.   Current medicines are reviewed at length with the patient today.  The patient does not have concerns regarding medicines.  History of Present Illness:   FOCUSED PROBLEM LIST:   CAD s/p PCI pLAD 2023 Hypertension Hyperlipidemia T2DM on metformin Papillary thyroid carcinoma s/p thyroidectomy  The patient is a 47 y.o. female with the indicated medical history here for for a preprocedure assessment prior to elective coronary angiography next week.  She has been cared for by Dr. Wyline Mood.  She was seen by Dr. Wyline Mood in April at that time she was doing fairly well.  She contacted Dr. Verna Czech office earlier this month with reports of persistent chest pressure quite similar to her anginal symptoms that resulted in coronary angiography and PCI of her LAD.  Because of the symptoms she was started on nitroglycerin referred for coronary angiography which is to be done next week.  The patient tells me that she develops a chest fullness that radiates to her neck and down her left arm.  This can happen with and without exertion.  It does not happen reliably with exertion.  These are exactly the same symptoms that she developed previously that ended up leading to her PCI.  However she has also been recuperating from a COVID infection and she has had some shortness of breath with this.  She is unsure  if the symptoms she is feeling are related to her previous COVID infection or if this represents recurrent anginal symptoms.  She remains on dual antiplatelet therapy consisting of aspirin and Brilinta.  She has had no myalgias while on Lipitor.  She has had no severe bleeding while on DAPT.         Current Medications: Current Meds  Medication Sig   acetaminophen (TYLENOL)  500 MG tablet Take 500 mg by mouth every 8 (eight) hours as needed for headache or moderate pain.   aspirin EC 81 MG tablet Take 1 tablet (81 mg total) by mouth daily. Swallow whole.   atorvastatin (LIPITOR) 80 MG tablet Take 1 tablet (80 mg total) by mouth every evening.   glimepiride (AMARYL) 2 MG tablet Take 2 mg by mouth daily with breakfast.   icosapent Ethyl (VASCEPA) 1 g capsule Take 2 capsules (2 g total) by mouth 2 (two) times daily.   JARDIANCE 25 MG TABS tablet Take 25 mg by mouth daily.   ketotifen (ZADITOR) 0.025 % ophthalmic solution Place 1 drop into both eyes daily as needed (allergies).   lisinopril (PRINIVIL,ZESTRIL) 10 MG tablet Take 10 mg by mouth every morning.    Magnesium 200 MG TABS Take 200 mg by mouth daily.   metFORMIN (GLUCOPHAGE) 1000 MG tablet Take 1 tablet (1,000 mg total) by mouth 2 (two) times daily.   metoprolol succinate (TOPROL XL) 25 MG 24 hr tablet Take 1 tablet (25 mg total) by mouth daily.   nitroGLYCERIN (NITROSTAT) 0.4 MG SL tablet Place 1 tablet (0.4 mg total) under the tongue every 5 (five) minutes as needed for chest pain.   OZEMPIC, 0.25 OR 0.5 MG/DOSE, 2 MG/3ML SOPN Inject 0.5 mg into the skin every Sunday.   Pediatric Multivit-Minerals-C (CHILDRENS GUMMIES) CHEW Chew 2 tablets by mouth every evening. Gummy   pioglitazone (ACTOS) 15 MG tablet Take 15 mg by mouth daily.   SYNTHROID 150 MCG tablet Take 1 tablet (150 mcg total) by mouth daily before breakfast.   ticagrelor (BRILINTA) 90 MG TABS tablet Take 90 mg by mouth 2 (two) times daily.     Allergies:    Adhesive [tape]   Social History:   Social History   Tobacco Use   Smoking status: Former    Current packs/day: 0.00    Average packs/day: 0.3 packs/day for 15.0 years (3.8 ttl pk-yrs)    Types: Cigarettes    Start date: 07/03/2007    Quit date: 07/02/2022    Years since quitting: 1.0   Smokeless tobacco: Never   Tobacco comments:    "im a casual mosker, every now and then"    Substance Use Topics   Alcohol use: Yes    Alcohol/week: 2.0 standard drinks of alcohol    Types: 2 Glasses of wine per week    Comment: occasionally   Drug use: No     Family Hx: History reviewed. No pertinent family history.   Review of Systems:   Please see the history of present illness.    All other systems reviewed and are negative.     EKGs/Labs/Other Test Reviewed:   EKG: EKG done today that I reviewed demonstrates normal sinus rhythm with nonspecific ST and T wave abnormalities.  EKG Interpretation Date/Time:    Ventricular Rate:    PR Interval:    QRS Duration:    QT Interval:    QTC Calculation:   R Axis:      Text Interpretation:  Prior CV studies reviewed: Cardiac Studies & Procedures   CARDIAC CATHETERIZATION  CARDIAC CATHETERIZATION 07/02/2022  Narrative CONCLUSIONS: 90% [place] 011 proximal to mid LAD stenosis with large diagonal #1. Successful single stent in LAD crossing the diagonal without compromise in reducing the stenosis to 0% with TIMI grade III flow.  Stent size 3.0 x 24 mm Onyx deployed at 14 atm. Left main is normal Circumflex and ramus are widely patent and normal Right coronary is dominant and widely patent. Normal LV function with EF greater than 65%.  LVEDP 14 mmHg.  RECOMMENDATIONS:  Intensify antilipid therapy Aspirin and Brilinta x12 months. Home in a.m. if no chest discomfort or problems.  Findings Coronary Findings Diagnostic  Dominance: Right  Left Anterior Descending Prox LAD to Mid LAD lesion is 90% stenosed.  First Diagonal Branch 1st Diag lesion is 30% stenosed.  Intervention  Prox LAD to Mid LAD lesion Stent A stent was successfully placed. Post-Intervention Lesion Assessment The intervention was successful. Pre-interventional TIMI flow is 3. Post-intervention TIMI flow is 3. No complications occurred at this lesion. There is a 0% residual stenosis post intervention.      ECHOCARDIOGRAM  ECHOCARDIOGRAM COMPLETE 04/02/2023  Narrative ECHOCARDIOGRAM REPORT    Patient Name:   ROMILLY ALEXIOU Date of Exam: 04/02/2023 Medical Rec #:  [id number]         Height:       63.0 in Accession #:    [id number]        Weight:       175.4 lb Date of Birth:  Jul 21, 1976         BSA:          1.829 m Patient Age:    47 years          BP:           132/85 mmHg Patient Gender: F                 HR:           83 bpm. [facility]  Procedure: 2D Echo, Cardiac Doppler and Color Doppler  Indications:    CAD (coronary artery disease) [[id number]]  History:        Patient has no prior history of Echocardiogram examinations. Risk Factors:Hypertension, Diabetes, Dyslipidemia and Former Smoker. Hx of COVID-19. Patient states she is s/p cardiac stenting.  Sonographer:    Celesta Gentile RCS Referring Phys: [phone] MARY E BRANCH  IMPRESSIONS   1. Left ventricular ejection fraction, by estimation, is 60 to 65%. The left ventricle has normal function. The left ventricle has no regional wall motion abnormalities. Indeterminate diastolic filling due to E-A fusion. 2. Right ventricular systolic function is normal. The right ventricular size is normal. Tricuspid regurgitation signal is inadequate for assessing PA pressure. 3. The mitral valve is abnormal. No evidence of mitral valve regurgitation. No evidence of mitral stenosis. Moderate mitral annular calcification. 4. The aortic valve was not well visualized. Aortic valve regurgitation is not visualized. No aortic stenosis is present. 5. The inferior vena cava is normal in size with greater than 50% respiratory variability, suggesting right atrial pressure of 3 mmHg.  Comparison(s): No prior Echocardiogram.  FINDINGS Left Ventricle: Left ventricular ejection fraction, by estimation, is 60 to 65%. The left ventricle has normal function. The left ventricle has no regional wall motion abnormalities. The left ventricular  internal cavity size was normal in size. There is no left ventricular hypertrophy. Indeterminate diastolic filling due to E-A  fusion.  Right Ventricle: The right ventricular size is normal. No increase in right ventricular wall thickness. Right ventricular systolic function is normal. Tricuspid regurgitation signal is inadequate for assessing PA pressure.  Left Atrium: Left atrial size was normal in size.  Right Atrium: Right atrial size was normal in size.  Pericardium: There is no evidence of pericardial effusion.  Mitral Valve: The mitral valve is abnormal. Moderate mitral annular calcification. No evidence of mitral valve regurgitation. No evidence of mitral valve stenosis.  Tricuspid Valve: The tricuspid valve is grossly normal. Tricuspid valve regurgitation is not demonstrated. No evidence of tricuspid stenosis.  Aortic Valve: The aortic valve was not well visualized. Aortic valve regurgitation is not visualized. No aortic stenosis is present.  Pulmonic Valve: The pulmonic valve was normal in structure. Pulmonic valve regurgitation is trivial. No evidence of pulmonic stenosis.  Aorta: The aortic root is normal in size and structure.  Venous: The inferior vena cava is normal in size with greater than 50% respiratory variability, suggesting right atrial pressure of 3 mmHg.  IAS/Shunts: No atrial level shunt detected by color flow Doppler.   LEFT VENTRICLE PLAX 2D LVIDd:         4.00 cm   Diastology LVIDs:         2.20 cm   LV e' medial:    9.36 cm/s LV PW:         1.00 cm   LV E/e' medial:  9.6 LV IVS:        1.00 cm   LV e' lateral:   9.36 cm/s LVOT diam:     1.90 cm   LV E/e' lateral: 9.6 LV SV:         61 LV SV Index:   33 LVOT Area:     2.84 cm   RIGHT VENTRICLE RV S prime:     12.60 cm/s TAPSE (M-mode): 2.3 cm  LEFT ATRIUM           Index        RIGHT ATRIUM           Index LA diam:      3.50 cm 1.91 cm/m   RA Area:     15.50 cm LA Vol (A2C): 38.2 ml 20.89  ml/m  RA Volume:   36.00 ml  19.69 ml/m LA Vol (A4C): 50.6 ml 27.67 ml/m AORTIC VALVE LVOT Vmax:   99.40 cm/s LVOT Vmean:  72.950 cm/s LVOT VTI:    0.216 m  AORTA Ao Root diam: 3.00 cm  MITRAL VALVE MV Area (PHT): 1.94 cm    SHUNTS MV Decel Time: 391 msec    Systemic VTI:  0.22 m MV E velocity: 90.20 cm/s  Systemic Diam: 1.90 cm MV A velocity: 87.10 cm/s MV E/A ratio:  1.04  Vishnu Priya Mallipeddi Electronically signed by Winfield Rast Mallipeddi Signature Date/Time: 04/02/2023/2:56:23 PM    Final     CT SCANS  CT CORONARY MORPH W/CTA COR W/SCORE 06/25/2022  Addendum 06/25/2022  3:58 PM ADDENDUM REPORT: 06/25/2022 15:56  CLINICAL DATA:  19F with chest pain  EXAM: Cardiac/Coronary CTA  TECHNIQUE: The patient was scanned on a [employer].  FINDINGS: A 100 kV prospective scan was triggered in the descending thoracic aorta at 111 HU's. Axial non-contrast 3 mm slices were carried out through the heart. The data set was analyzed on a dedicated work station and scored using the Agatson method. Gantry rotation speed was 250 msecs and collimation was .6 mm. 0.8  mg of sl NTG was given. The 3D data set was reconstructed in 5% intervals of the 35-75 % of the R-R cycle. Phases were analyzed on a dedicated work station using MPR, MIP and VRT modes. The patient received 100 cc of contrast.  Coronary Arteries:  Normal coronary origin.  Right dominance.  RCA is a large dominant artery that gives rise to PDA and PLA. Calcified plaque in the mid RCA causes 0-24% stenosis  Left main is a large artery that gives rise to LAD and LCX arteries.  LAD is a large vessel. Mixed plaque in the proximal LAD causes severe (70-99%) stenosis  LCX is a non-dominant artery that gives rise to one large OM1 branch. There is no plaque.  Ramus has no plaque.  Other findings:  Left Ventricle: Normal size  Left Atrium: Normal size  Pulmonary Veins: Normal  configuration  Right Ventricle: Normal size  Right Atrium: Normal size  Cardiac valves: No calcifications  Thoracic aorta: Normal size  Pulmonary Arteries: Normal size  Systemic Veins: Normal drainage  Pericardium: Normal thickness  IMPRESSION: 1. Coronary calcium score of 75. This was 99th percentile for age and sex matched control.  2. Normal coronary origin with right dominance.  3. Obstructive CAD  4. Mixed plaque in the proximal LAD causes severe (70-99%) stenosis  5. Cardiac catheterization recommended  CAD-RADS 4 Severe stenosis. (70-99% or > 50% left main). Cardiac catheterization or CT FFR is recommended. Consider symptom-guided anti-ischemic pharmacotherapy as well as risk factor modification per guideline directed care.   Electronically Signed By: Epifanio Lesches M.D. On: 06/25/2022 15:56  Narrative EXAM: OVER-READ INTERPRETATION  CT CHEST  The following report is a limited chest CT over-read performed by radiologist Dr. Lupita Raider of [facility] on 06/25/2022.The coronary CTA interpretation by the cardiologist is attached.  COMPARISON:  None Available.  FINDINGS: Vascular: Visualized extracardiac vascular structures are unremarkable.  Mediastinum/Nodes: Visualized mediastinum is unremarkable.  Lungs/Pleura: Visualized pulmonary parenchyma is unremarkable.  Upper Abdomen: Visualized upper abdomen is unremarkable.  Musculoskeletal: No chest wall mass or suspicious bone lesions identified.  IMPRESSION: No definite abnormality seen involving the visualized extracardiac structures of the chest.  Electronically Signed: By: Lupita Raider M.D. On: 06/25/2022 14:14          Recent Labs: 08/30/2022: ALT 16 05/13/2023: TSH 1.08 07/02/2023: BUN 12; Creatinine, Ser 0.60; Hemoglobin 14.1; Platelets 391; Potassium 4.2; Sodium 139   Lipid Panel    Component Value Date/Time   CHOL 107 08/30/2022 0853   TRIG 211 (H)  08/30/2022 0853   HDL 34 (L) 08/30/2022 0853   CHOLHDL 3.1 08/30/2022 0853   CHOLHDL 4.8 07/03/2022 0622   VLDL 78 (H) 07/03/2022 0622   LDLCALC 39 08/30/2022 0853    Risk Assessment/Calculations:          Physical Exam:   VS:  BP 130/82   Pulse 91   Ht 5\' 3"  (1.6 m)   Wt 178 lb 3.2 oz (80.8 kg)   LMP 04/29/2016 Comment: LMP was over 2 years ago pre-ablation   SpO2 91%   BMI 31.57 kg/m        Wt Readings from Last 3 Encounters:  07/04/23 178 lb 3.2 oz (80.8 kg)  05/13/23 175 lb (79.4 kg)  03/04/23 175 lb 6.4 oz (79.6 kg)      GENERAL:  No apparent distress, AOx3 HEENT:  No carotid bruits, +2 carotid impulses, no scleral icterus CAR: RRR no murmurs, gallops, rubs, or  thrills RES:  Clear to auscultation bilaterally ABD:  Soft, nontender, nondistended, positive bowel sounds x 4 VASC:  +2 radial pulses, +2 carotid pulses NEURO:  CN 2-12 grossly intact; motor and sensory grossly intact PSYCH:  No active depression or anxiety EXT:  No edema, ecchymosis, or cyanosis  Signed, Orbie Pyo, MD  07/04/2023 10:05 PM    [facility] [address] [place], [place], [place]  [address] Phone: [phone]; Fax: [phone]   Note:  This document was prepared using Dragon voice recognition software and may include unintentional dictation errors.

## 2023-07-04 ENCOUNTER — Encounter: Payer: Self-pay | Admitting: Internal Medicine

## 2023-07-04 ENCOUNTER — Ambulatory Visit: Payer: Commercial Managed Care - PPO | Attending: Internal Medicine | Admitting: Internal Medicine

## 2023-07-04 VITALS — BP 130/82 | HR 91 | Ht 63.0 in | Wt 178.2 lb

## 2023-07-04 DIAGNOSIS — I209 Angina pectoris, unspecified: Secondary | ICD-10-CM | POA: Diagnosis not present

## 2023-07-04 DIAGNOSIS — Z794 Long term (current) use of insulin: Secondary | ICD-10-CM

## 2023-07-04 DIAGNOSIS — E1159 Type 2 diabetes mellitus with other circulatory complications: Secondary | ICD-10-CM

## 2023-07-04 DIAGNOSIS — I152 Hypertension secondary to endocrine disorders: Secondary | ICD-10-CM

## 2023-07-04 DIAGNOSIS — E785 Hyperlipidemia, unspecified: Secondary | ICD-10-CM

## 2023-07-04 DIAGNOSIS — E1169 Type 2 diabetes mellitus with other specified complication: Secondary | ICD-10-CM | POA: Diagnosis not present

## 2023-07-04 NOTE — Patient Instructions (Signed)
Medication Instructions:  No changes *If you need a refill on your cardiac medications before your next appointment, please call your pharmacy*   Lab Work: none If you have labs (blood work) drawn today and your tests are completely normal, you will receive your results only by: MyChart Message (if you have MyChart) OR A paper copy in the mail If you have any lab test that is abnormal or we need to change your treatment, we will call you to review the results.   Testing/Procedures: Cath as planned   Follow-Up: As planned  Other Instructions

## 2023-07-07 ENCOUNTER — Ambulatory Visit (HOSPITAL_COMMUNITY)
Admission: RE | Disposition: A | Discharge status: Home / Self Care | Payer: Self-pay | Source: Home / Self Care | Attending: Cardiology

## 2023-07-07 ENCOUNTER — Other Ambulatory Visit: Payer: Self-pay

## 2023-07-07 ENCOUNTER — Ambulatory Visit (HOSPITAL_COMMUNITY)
Admission: RE | Admit: 2023-07-07 | Discharge: 2023-07-07 | Disposition: A | Discharge status: Home / Self Care | Payer: Commercial Managed Care - PPO | Attending: Cardiology | Admitting: Cardiology

## 2023-07-07 DIAGNOSIS — Z7902 Long term (current) use of antithrombotics/antiplatelets: Secondary | ICD-10-CM | POA: Insufficient documentation

## 2023-07-07 DIAGNOSIS — E785 Hyperlipidemia, unspecified: Secondary | ICD-10-CM | POA: Diagnosis not present

## 2023-07-07 DIAGNOSIS — Z79899 Other long term (current) drug therapy: Secondary | ICD-10-CM | POA: Insufficient documentation

## 2023-07-07 DIAGNOSIS — I10 Essential (primary) hypertension: Secondary | ICD-10-CM | POA: Diagnosis not present

## 2023-07-07 DIAGNOSIS — Z87891 Personal history of nicotine dependence: Secondary | ICD-10-CM | POA: Diagnosis not present

## 2023-07-07 DIAGNOSIS — Z7984 Long term (current) use of oral hypoglycemic drugs: Secondary | ICD-10-CM | POA: Diagnosis not present

## 2023-07-07 DIAGNOSIS — I25119 Atherosclerotic heart disease of native coronary artery with unspecified angina pectoris: Secondary | ICD-10-CM | POA: Insufficient documentation

## 2023-07-07 DIAGNOSIS — Z7985 Long-term (current) use of injectable non-insulin antidiabetic drugs: Secondary | ICD-10-CM | POA: Insufficient documentation

## 2023-07-07 DIAGNOSIS — Z955 Presence of coronary angioplasty implant and graft: Secondary | ICD-10-CM | POA: Insufficient documentation

## 2023-07-07 DIAGNOSIS — E1159 Type 2 diabetes mellitus with other circulatory complications: Secondary | ICD-10-CM | POA: Diagnosis not present

## 2023-07-07 DIAGNOSIS — I2510 Atherosclerotic heart disease of native coronary artery without angina pectoris: Secondary | ICD-10-CM

## 2023-07-07 DIAGNOSIS — Z7982 Long term (current) use of aspirin: Secondary | ICD-10-CM | POA: Insufficient documentation

## 2023-07-07 HISTORY — PX: LEFT HEART CATH AND CORONARY ANGIOGRAPHY: CATH118249

## 2023-07-07 LAB — GLUCOSE, CAPILLARY
Glucose-Capillary: 129 mg/dL — ABNORMAL HIGH (ref 70–99)
Glucose-Capillary: 103 mg/dL — ABNORMAL HIGH (ref 70–99)

## 2023-07-07 SURGERY — LEFT HEART CATH AND CORONARY ANGIOGRAPHY
Anesthesia: LOCAL

## 2023-07-07 MED ORDER — HEPARIN (PORCINE) IN NACL 1000-0.9 UT/500ML-% IV SOLN
INTRAVENOUS | Status: DC | PRN
  Administered 2023-07-07 (×2): 500 mL

## 2023-07-07 MED ORDER — MIDAZOLAM HCL 2 MG/2ML IJ SOLN
INTRAMUSCULAR | Status: AC
  Filled 2023-07-07: qty 2

## 2023-07-07 MED ORDER — LIDOCAINE HCL (PF) 1 % IJ SOLN
INTRAMUSCULAR | Status: DC | PRN
  Administered 2023-07-07: 2 mL

## 2023-07-07 MED ORDER — SODIUM CHLORIDE 0.9% FLUSH: 3.0000 mL | Freq: Two times a day (BID) | INTRAVENOUS | Status: DC

## 2023-07-07 MED ORDER — MIDAZOLAM HCL 2 MG/2ML IJ SOLN
INTRAMUSCULAR | Status: DC | PRN
  Administered 2023-07-07: 1 mg via INTRAVENOUS

## 2023-07-07 MED ORDER — LIDOCAINE HCL (PF) 1 % IJ SOLN
INTRAMUSCULAR | Status: AC
  Filled 2023-07-07: qty 30

## 2023-07-07 MED ORDER — HYDRALAZINE HCL 20 MG/ML IJ SOLN: 10.0000 mg | INTRAMUSCULAR | Status: DC | PRN

## 2023-07-07 MED ORDER — IOHEXOL 350 MG/ML SOLN
INTRAVENOUS | Status: DC | PRN
  Administered 2023-07-07: 56 mL

## 2023-07-07 MED ORDER — HEPARIN SODIUM (PORCINE) 1000 UNIT/ML IJ SOLN
INTRAMUSCULAR | Status: AC
  Filled 2023-07-07: qty 10

## 2023-07-07 MED ORDER — VERAPAMIL HCL 2.5 MG/ML IV SOLN
INTRAVENOUS | Status: DC | PRN
  Administered 2023-07-07: 10 mL via INTRA_ARTERIAL

## 2023-07-07 MED ORDER — FENTANYL CITRATE (PF) 100 MCG/2ML IJ SOLN
INTRAMUSCULAR | Status: DC | PRN
  Administered 2023-07-07: 25 ug via INTRAVENOUS

## 2023-07-07 MED ORDER — SODIUM CHLORIDE 0.9 % IV SOLN: INTRAVENOUS | Status: DC

## 2023-07-07 MED ORDER — ACETAMINOPHEN 325 MG PO TABS: 650.0000 mg | ORAL_TABLET | ORAL | Status: DC | PRN

## 2023-07-07 MED ORDER — HEPARIN SODIUM (PORCINE) 1000 UNIT/ML IJ SOLN
INTRAMUSCULAR | Status: DC | PRN
  Administered 2023-07-07: 4000 [IU] via INTRAVENOUS

## 2023-07-07 MED ORDER — SODIUM CHLORIDE 0.9 % WEIGHT BASED INFUSION: 3.0000 mL/kg/h | INTRAVENOUS | Status: DC

## 2023-07-07 MED ORDER — TICAGRELOR 90 MG PO TABS: 90.0000 mg | ORAL_TABLET | ORAL | Status: DC

## 2023-07-07 MED ORDER — ONDANSETRON HCL 4 MG/2ML IJ SOLN: 4.0000 mg | Freq: Four times a day (QID) | INTRAMUSCULAR | Status: DC | PRN

## 2023-07-07 MED ORDER — VERAPAMIL HCL 2.5 MG/ML IV SOLN
INTRAVENOUS | Status: AC
  Filled 2023-07-07: qty 2

## 2023-07-07 MED ORDER — SODIUM CHLORIDE 0.9% FLUSH: 3.0000 mL | INTRAVENOUS | Status: DC | PRN

## 2023-07-07 MED ORDER — SODIUM CHLORIDE 0.9 % WEIGHT BASED INFUSION: 1.0000 mL/kg/h | INTRAVENOUS | Status: DC

## 2023-07-07 MED ORDER — ASPIRIN 81 MG PO CHEW: 81.0000 mg | CHEWABLE_TABLET | ORAL | Status: DC

## 2023-07-07 MED ORDER — FENTANYL CITRATE (PF) 100 MCG/2ML IJ SOLN
INTRAMUSCULAR | Status: AC
  Filled 2023-07-07: qty 2

## 2023-07-07 MED ORDER — SODIUM CHLORIDE 0.9 % IV SOLN: 250.0000 mL | INTRAVENOUS | Status: DC | PRN

## 2023-07-07 MED ORDER — LABETALOL HCL 5 MG/ML IV SOLN: 10.0000 mg | INTRAVENOUS | Status: DC | PRN

## 2023-07-07 SURGICAL SUPPLY — 10 items
CATH INFINITI AMBI 5FR TG (CATHETERS) IMPLANT
DEVICE RAD COMP TR BAND LRG (VASCULAR PRODUCTS) IMPLANT
ELECT DEFIB PAD ADLT CADENCE (PAD) IMPLANT
GLIDESHEATH SLEND SS 6F .021 (SHEATH) IMPLANT
GUIDEWIRE INQWIRE 1.5J.035X260 (WIRE) IMPLANT
INQWIRE 1.5J .035X260CM (WIRE) ×1
KIT SYRINGE INJ CVI SPIKEX1 (MISCELLANEOUS) IMPLANT
PACK CARDIAC CATHETERIZATION (CUSTOM PROCEDURE TRAY) ×1 IMPLANT
SET ATX-X65L (MISCELLANEOUS) IMPLANT
SHEATH PROBE COVER 6X72 (BAG) IMPLANT

## 2023-07-07 NOTE — Discharge Instructions (Addendum)
Drink plenty of fluids for 48 hours and keep wrist elevated at heart level for 24 hours  Radial Site Care   This sheet gives you information about how to care for yourself after your procedure. Your health care provider may also give you more specific instructions. If you have problems or questions, contact your health care provider. What can I expect after the procedure? After the procedure, it is common to have: Bruising and tenderness at the catheter insertion area. Follow these instructions at home: Medicines Take over-the-counter and prescription medicines only as told by your health care provider. Insertion site care Follow instructions from your health care provider about how to take care of your insertion site. Make sure you: Wash your hands with soap and water before you change your bandage (dressing). If soap and water are not available, use hand sanitizer. Remove your dressing AT 12 NOON, 07/08/2023 Check your insertion site every day for signs of infection. Check for: Redness, swelling, or pain. Fluid or blood. Pus or a bad smell. Warmth. Do not take baths, swim, or use a hot tub until your health care provider approves. You may shower AFTER YOU REMOVE THE DRESSING Pat the area dry with a clean towel. Do not rub the site. That could cause bleeding. Do not apply powder or lotion to the site. Activity   For 24 hours after the procedure, or as directed by your health care provider: Do not flex or bend the affected arm. Do not push or pull heavy objects with the affected arm. Do not drive yourself home from the hospital or clinic. You may drive 24 hours after the procedure unless your health care provider tells you not to. Do not operate machinery or power tools. Do not lift anything that is heavier than 10 lb (4.5 kg), or the limit that you are told, until your health care provider says that it is safe.  For 4 days Ask your health care provider when it is okay to: Return  to work or school. Resume usual physical activities or sports. Resume sexual activity. General instructions If the catheter site starts to bleed, raise your arm and put firm pressure on the site for 15 minutes. If the bleeding does not stop, get help right away. This is a medical emergency. If you went home on the same day as your procedure, a responsible adult should be with you for the first 24 hours after you arrive home. Keep all follow-up visits as told by your health care provider. This is important. Contact a health care provider if: You have a fever. You have redness, swelling, or yellow drainage around your insertion site. Get help right away if: You have unusual pain at the radial site. The catheter insertion area swells very fast raise your arm and put firm pressure on the site for 15 minutes. If the bleeding does not stop, get help right away. This is a medical emergency. The insertion area is bleeding, raise your arm and put firm pressure on the site for 15 minutes.on the area. If the bleeding does not stop, get help right away. This is a medical emergency. Your arm or hand becomes pale, cool, tingly, or numb. These symptoms may represent a serious problem that is an emergency. Do not wait to see if the symptoms will go away. Get medical help right away. Call your local emergency services (911 in the [place] Do not drive yourself to the hospital. Summary After the procedure, it is common to have  bruising and tenderness at the site. Follow instructions from your health care provider about how to take care of your radial site wound. Check the wound every day for signs of infection. Do not lift anything that is heavier than 10 lb (4.5 kg), or the limit that you are told, until your health care provider says that it is safe. This information is not intended to replace advice given to you by your health care provider. Make sure you discuss any questions you have with your health care  provider. Document Revised: 12/10/2017 Document Reviewed: 12/10/2017 Elsevier Patient Education  2020 [employer].

## 2023-07-07 NOTE — Interval H&P Note (Signed)
History and Physical Interval Note:  07/07/2023 9:37 AM  Carla Knight  has presented today for surgery, with the diagnosis of progressive angina.  The various methods of treatment have been discussed with the patient and family. After consideration of risks, benefits and other options for treatment, the patient has consented to  Procedure(s): LEFT HEART CATH AND CORONARY ANGIOGRAPHY (N/A)  PERCUTANEOUS CORONARY INTERVENTION   as a surgical intervention.  The patient's history has been reviewed, patient examined, no change in status, stable for surgery.  I have reviewed the patient's chart and labs.  Questions were answered to the patient's satisfaction.    Cath Lab Visit (complete for each Cath Lab visit)  Clinical Evaluation Leading to the Procedure:   ACS: No.  Non-ACS:    Anginal Classification: CCS IV  Anti-ischemic medical therapy: Maximal Therapy (2 or more classes of medications)  Non-Invasive Test Results: No non-invasive testing performed  Prior CABG: No previous CABG   Bryan Lemma

## 2023-07-08 ENCOUNTER — Encounter (HOSPITAL_COMMUNITY): Payer: Self-pay | Admitting: Cardiology

## 2023-07-09 MED FILL — Lidocaine HCl Local Preservative Free (PF) Inj 1%: INTRAMUSCULAR | Qty: 30 | Status: AC

## 2023-07-09 MED FILL — Verapamil HCl IV Soln 2.5 MG/ML: INTRAVENOUS | Qty: 2 | Status: AC

## 2023-07-15 ENCOUNTER — Other Ambulatory Visit: Payer: Self-pay

## 2023-07-15 ENCOUNTER — Other Ambulatory Visit (HOSPITAL_COMMUNITY): Payer: Self-pay

## 2023-07-15 ENCOUNTER — Encounter: Payer: Self-pay | Admitting: Internal Medicine

## 2023-07-15 ENCOUNTER — Telehealth: Payer: Self-pay

## 2023-07-15 MED ORDER — ICOSAPENT ETHYL 1 G PO CAPS: 2.0000 g | ORAL_CAPSULE | Freq: Two times a day (BID) | ORAL | 3 refills | Status: DC

## 2023-07-15 NOTE — Telephone Encounter (Signed)
Pharmacy Patient Advocate Encounter   Received notification from CoverMyMeds that prior authorization for VASCEPA is required/requested.   Insurance verification completed.   The patient is insured through [employer] .   Per test claim: PA required; PA submitted to [employer] via CoverMyMeds Key/confirmation #/EOC [id number] Status is pending

## 2023-07-16 ENCOUNTER — Other Ambulatory Visit (HOSPITAL_COMMUNITY): Payer: Self-pay

## 2023-07-16 NOTE — Telephone Encounter (Signed)
Pharmacy Patient Advocate Encounter  Received notification from [employer] that Prior Authorization for VASCEPA has been APPROVED from 06/15/23 to 07/13/24

## 2023-08-08 NOTE — Telephone Encounter (Signed)
Noted pt had LHC on 07/07/23.

## 2023-08-08 NOTE — Telephone Encounter (Signed)
[facility] ([facility]) to offer sooner f/u appt if pt is still experiencing symptoms per Dr. Wyline Mood. Main office number provided. Pt is currently scheduled for an OV appt with Dr. Wyline Mood on 09/16/23.

## 2023-09-16 ENCOUNTER — Encounter: Payer: Self-pay | Admitting: Internal Medicine

## 2023-09-16 ENCOUNTER — Ambulatory Visit: Payer: Commercial Managed Care - PPO | Attending: Internal Medicine | Admitting: Internal Medicine

## 2023-09-16 VITALS — BP 120/76 | HR 83 | Wt 179.0 lb

## 2023-09-16 DIAGNOSIS — I2510 Atherosclerotic heart disease of native coronary artery without angina pectoris: Secondary | ICD-10-CM | POA: Diagnosis not present

## 2023-09-16 MED ORDER — CLOPIDOGREL BISULFATE 75 MG PO TABS: 75.0000 mg | ORAL_TABLET | Freq: Every day | ORAL | 3 refills | Status: DC

## 2023-09-16 NOTE — Patient Instructions (Signed)
Medication Instructions:  Stop Aspirin  Stop Brilinta  Start taking Plavix 75 mg daily  *If you need a refill on your cardiac medications before your next appointment, please call your pharmacy*   Lab Work: None   Testing/Procedures: None    Follow-Up: At [facility], you and your health needs are our priority.  As part of our continuing mission to provide you with exceptional heart care, we have created designated Provider Care Teams.  These Care Teams include your primary Cardiologist (physician) and Advanced Practice Providers (APPs -  Physician Assistants and Nurse Practitioners) who all work together to provide you with the care you need, when you need it.    Your next appointment:   1 year(s)  Provider:   Maisie Fus, MD

## 2023-09-16 NOTE — Progress Notes (Signed)
Cardiology Office Note:    Date:  09/16/2023   ID:  Carla Knight, DOB 14-May-1976, MRN [id number]  PCP:  Carylon Perches, MD   [facility] Providers Cardiologist:  Maisie Fus, MD     Referring MD: Carylon Perches, MD   No chief complaint on file. Cp  History of Present Illness:    Carla Knight is a 47 y.o. female with a hx of DM2, HTN, papillary thyroid s/p total thryoidectomy. CA referral for ED visit with chest pain. 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 The sensation when down her left arm. She has a lot of job stress and some anxiety related symptoms. She notes her chest continues to feel uncomfortable. Nothing makes it worse. It comes and goes lastiing 10-20 minutes. Symptoms are not just associated with activity. She smoked in the past.Her father had CABG and PCI. Mother has HTN.  In the ED, ACS was ruled out.. EKG showed NSR. No ST changes. She has no cardiac dx hx.   Blood pressure is normal.  Interim Hx 03/04/2023 Carla Knight had a significant prox to mid LAD lesion. She underwent LHC s/p onyx DES. She was started on asa and brilinta. She denies CP. Her family is doing well. No issues with medicine. She had a nose bleed, the week before Easter, no recurrence.   Interim hx 09/16/2023 In August 2024 for approximately a year post her prior PCI; she reported persistent chest pressure.  I arranged for a left heart cath for her.  She had no new lesions and mild in-stent restenosis of 25% focally.  There was also 45% stenosis at the junction of the first diagonal. She denies CP.   Past Medical History:  Diagnosis Date   Diabetes mellitus    NIDDM, type 2    Dyslipidemia    Goiter    no current meds.   Hypertension    under control; has been on med. x 5-6 yrs.   Hypothyroidism    Papillary thyroid carcinoma (HCC)    incidentally found during total thyroidectomy , neegative for metastasis   Parotid mass 11/2011   right   PONV  (postoperative nausea and vomiting)    nauseated when ate after surgery   Thyroid mass     Past Surgical History:  Procedure Laterality Date   ABLATION  2016 or 2017 unsure   obgyn dr Ernestina Penna    CESAREAN SECTION  10/18/2008   CESAREAN SECTION W/BTL  04/08/2011   CORONARY STENT INTERVENTION N/A 07/02/2022   Procedure: CORONARY STENT INTERVENTION;  Surgeon: Lyn Records, MD;  [facility];  Service: Cardiovascular;  Laterality: N/A;   LAPAROSCOPIC CHOLECYSTECTOMY  06/05/2009   LEFT HEART CATH AND CORONARY ANGIOGRAPHY N/A 07/02/2022   Procedure: LEFT HEART CATH AND CORONARY ANGIOGRAPHY;  Surgeon: Lyn Records, MD;  [facility]: [facility];  Service: Cardiovascular;  Laterality: N/A;   LEFT HEART CATH AND CORONARY ANGIOGRAPHY N/A 07/07/2023   Procedure: LEFT HEART CATH AND CORONARY ANGIOGRAPHY;  Surgeon: Marykay Lex, MD;  Location: [facility] INVASIVE CV LAB;  Service: Cardiovascular;  Laterality: N/A;   PAROTIDECTOMY  12/17/2011   Procedure: PAROTIDECTOMY;  Surgeon: Darletta Moll, MD;  Location: [facility];  Service: ENT;  Laterality: Right;  Total right parotidectomy   ROBOTIC ASSISTED LAPAROSCOPIC HYSTERECTOMY AND SALPINGECTOMY Bilateral 05/01/2018   Procedure: XI ROBOTIC ASSISTED LAPAROSCOPIC HYSTERECTOMY AND SALPINGECTOMY;  Surgeon: Noland Fordyce, MD;  Location: [facility];  Service: Gynecology;  Laterality: Bilateral;  Requests 3 1/2 hrs.   ROBOTIC ASSISTED LAPAROSCOPIC LYSIS OF ADHESION  05/01/2018   Procedure: XI ROBOTIC ASSISTED LAPAROSCOPIC LYSIS OF ADHESION;  Surgeon: Noland Fordyce, MD;  Location: [facility];  Service: Gynecology;;   THYROIDECTOMY  05/13/2012   Procedure: THYROIDECTOMY;  Surgeon: Darletta Moll, MD;  Location: [facility];  Service: ENT;  Laterality: Bilateral;  TOTAL THYROIDECTOMY    Current Medications: Current Meds  Medication Sig   acetaminophen (TYLENOL) 500 MG tablet Take 500 mg by mouth every 8 (eight) hours as needed for headache or moderate  pain.   atorvastatin (LIPITOR) 80 MG tablet Take 1 tablet (80 mg total) by mouth every evening.   clopidogrel (PLAVIX) 75 MG tablet Take 1 tablet (75 mg total) by mouth daily.   glimepiride (AMARYL) 2 MG tablet Take 2 mg by mouth daily with breakfast.   icosapent Ethyl (VASCEPA) 1 g capsule Take 2 capsules (2 g total) by mouth 2 (two) times daily.   JARDIANCE 25 MG TABS tablet Take 25 mg by mouth daily.   ketotifen (ZADITOR) 0.025 % ophthalmic solution Place 1 drop into both eyes daily as needed (allergies).   lisinopril (PRINIVIL,ZESTRIL) 10 MG tablet Take 10 mg by mouth every morning.    Magnesium 200 MG TABS Take 200 mg by mouth daily.   metFORMIN (GLUCOPHAGE) 1000 MG tablet Take 1 tablet (1,000 mg total) by mouth 2 (two) times daily.   metoprolol succinate (TOPROL XL) 25 MG 24 hr tablet Take 1 tablet (25 mg total) by mouth daily.   nitroGLYCERIN (NITROSTAT) 0.4 MG SL tablet Place 1 tablet (0.4 mg total) under the tongue every 5 (five) minutes as needed for chest pain.   OZEMPIC, 0.25 OR 0.5 MG/DOSE, 2 MG/3ML SOPN Inject 0.5 mg into the skin every Sunday.   Pediatric Multivit-Minerals-C (CHILDRENS GUMMIES) CHEW Chew 2 tablets by mouth every evening. Gummy   pioglitazone (ACTOS) 15 MG tablet Take 15 mg by mouth daily.   SYNTHROID 150 MCG tablet Take 1 tablet (150 mcg total) by mouth daily before breakfast.   [DISCONTINUED] aspirin EC 81 MG tablet Take 1 tablet (81 mg total) by mouth daily. Swallow whole.   [DISCONTINUED] ticagrelor (BRILINTA) 90 MG TABS tablet Take 90 mg by mouth 2 (two) times daily.     Allergies:   Adhesive [tape]   Social History   Socioeconomic History   Marital status: Married    Spouse name: Not on file   Number of children: Not on file   Years of education: Not on file   Highest education level: Not on file  Occupational History   Not on file  Tobacco Use   Smoking status: Former    Current packs/day: 0.00    Average packs/day: 0.3 packs/day for 15.0 years  (3.8 ttl pk-yrs)    Types: Cigarettes    Start date: 07/03/2007    Quit date: 07/02/2022    Years since quitting: 1.2   Smokeless tobacco: Never   Tobacco comments:    "im a casual mosker, every now and then"   Substance and Sexual Activity   Alcohol use: Yes    Alcohol/week: 2.0 standard drinks of alcohol    Types: 2 Glasses of wine per week    Comment: occasionally   Drug use: No   Sexual activity: Not on file  Other Topics Concern   Not on file  Social History Narrative   Not on file  Social Determinants of Health   Financial Resource Strain: Not on file  Food Insecurity: Not on file  Transportation Needs: Not on file  Physical Activity: Not on file  Stress: Not on file  Social Connections: Not on file     Family History: Father- cardiac dx per above Mother - HTN  ROS:   Please see the history of present illness.     All other systems reviewed and are negative.  EKGs/Labs/Other Studies Reviewed:    The following studies were reviewed today:   EKG:  EKG is  ordered today.  The ekg ordered today demonstrates    06/06/2022- NSR   Cardiology Studies 06/25/2022- Coronary CTA 0.77 prox LAD  Cath: 07/02/22   CONCLUSIONS: 90% [place] 011 proximal to mid LAD stenosis with large diagonal #1. Successful single stent in LAD crossing the diagonal without compromise in reducing the stenosis to 0% with TIMI grade III flow.  Stent size 3.0 x 24 mm Onyx deployed at 14 atm. Left main is normal Circumflex and ramus are widely patent and normal Right coronary is dominant and widely patent. Normal LV function with EF greater than 65%.  LVEDP 14 mmHg.  TTE 04/02/2023 EF 60-65% MAC  LHC 07/07/2023     Prox LAD to Mid LAD stent has focal 25% in-stnet restenosed with 45% stenosed side Silvio Sausedo in 1st Diag. (Stable from Post PCI)   LV end diastolic pressure is normal.   There is no aortic valve stenosis.     Widely patent LAD stent with mild ISR & stable ostial Diag 45%  stenosis with TIMI 3 flow & no flow limiting lesions.      RECOMMENDATIONS   In the absence of any other complications or medical issues, we expect the patient to be ready for discharge from a cath perspective on 07/07/2023.   Continue DAPT to complete 1 year -would then convert to maintenance dose Brilinta 60 mg BID or Plavix 75 mg daily monotherapy   Recent Labs: 05/13/2023: TSH 1.08 07/02/2023: BUN 12; Creatinine, Ser 0.60; Hemoglobin 14.1; Platelets 391; Potassium 4.2; Sodium 139   Recent Lipid Panel    Component Value Date/Time   CHOL 107 08/30/2022 0853   TRIG 211 (H) 08/30/2022 0853   HDL 34 (L) 08/30/2022 0853   CHOLHDL 3.1 08/30/2022 0853   CHOLHDL 4.8 07/03/2022 0622   VLDL 78 (H) 07/03/2022 0622   LDLCALC 39 08/30/2022 0853     Risk Assessment/Calculations:           Physical Exam:    VS:  Vitals:   09/16/23 1054  BP: 120/76  Pulse: 83  SpO2: 99%      Wt Readings from Last 3 Encounters:  09/16/23 179 lb (81.2 kg)  07/04/23 178 lb 3.2 oz (80.8 kg)  05/13/23 175 lb (79.4 kg)     GEN:  Well nourished, well developed in no acute distress HEENT: Normal NECK: No JVD; No carotid bruits LYMPHATICS: No lymphadenopathy CARDIAC: RRR, no murmurs, rubs, gallops RESPIRATORY:  Clear to auscultation without rales, wheezing or rhonchi  ABDOMEN: Soft, non-tender, non-distended MUSCULOSKELETAL:  No edema; No deformity  SKIN: Warm and dry NEUROLOGIC:  Alert and oriented x 3 PSYCHIATRIC:  Normal affect   ASSESSMENT:    LAD PCI: p/w angina. Her symptoms were atypical, not related to activity, however with DM2 decided to get coronary CTA. She had prox-mid LAD lesion , now s/p DES to the prox-mid LAD 07/02/2022. She has an elevated Lp(a). S/p cardiac rehab -  can stop asa and brilinta for 12 months; 07/03/2023. She can start plavix monotherapy today - continue lipitor 80 mg daily; vascepa (LDL 41 07/03/2022, at goal; repeat in a year) - continue jardiance 25 mg daily -  continue metop XL 25 mg daily - nitro SL  HTN: well controlled. continue lisinopril 10 mg daily. BB  DM2: 7.3% 04/2022. SGLT2,  A1c goal <7  PLAN:    In order of problems listed above:   Stop asa and brillinta; start plavix 75 mg daily Follow up 12 months     Medication Adjustments/Labs and Tests Ordered: Current medicines are reviewed at length with the patient today.  Concerns regarding medicines are outlined above.  No orders of the defined types were placed in this encounter.  Meds ordered this encounter  Medications   clopidogrel (PLAVIX) 75 MG tablet    Sig: Take 1 tablet (75 mg total) by mouth daily.    Dispense:  90 tablet    Refill:  3    There are no Patient Instructions on file for this visit.   Signed, Maisie Fus, MD  09/16/2023 11:13 AM    [facility]

## 2023-11-28 ENCOUNTER — Other Ambulatory Visit: Payer: Self-pay | Admitting: Internal Medicine

## 2024-01-10 ENCOUNTER — Other Ambulatory Visit: Payer: Self-pay | Admitting: Internal Medicine

## 2024-03-24 ENCOUNTER — Other Ambulatory Visit: Payer: Self-pay | Admitting: Internal Medicine

## 2024-04-10 ENCOUNTER — Other Ambulatory Visit: Payer: Self-pay | Admitting: Internal Medicine

## 2024-05-03 ENCOUNTER — Telehealth: Payer: Self-pay | Admitting: Internal Medicine

## 2024-05-03 MED ORDER — ICOSAPENT ETHYL 1 G PO CAPS: 2.0000 g | ORAL_CAPSULE | Freq: Two times a day (BID) | ORAL | 1 refills | Status: DC

## 2024-05-03 NOTE — Telephone Encounter (Signed)
 RX sent to requested Pharmacy

## 2024-05-03 NOTE — Telephone Encounter (Signed)
*  STAT* If patient is at the pharmacy, call can be transferred to refill team.   1. Which medications need to be refilled? (please list name of each medication and dose if known)   2. Would you like to learn more about the convenience, safety, & potential cost savings by using the [facility]?      3. Are you open to using the [facility] (Type [facility].    4. Which pharmacy/location (including street and city if local pharmacy) is medication to be sent to?[facility]  [place]   5. Do they need a 30 day or 90 day supply? 30 days and refills- please call today- out of medicine

## 2024-05-12 ENCOUNTER — Ambulatory Visit (INDEPENDENT_AMBULATORY_CARE_PROVIDER_SITE_OTHER): Payer: Commercial Managed Care - PPO | Admitting: Internal Medicine

## 2024-05-12 ENCOUNTER — Encounter: Payer: Self-pay | Admitting: Internal Medicine

## 2024-05-12 VITALS — BP 120/80 | HR 83 | Ht 63.0 in | Wt 169.0 lb

## 2024-05-12 DIAGNOSIS — Z85850 Personal history of malignant neoplasm of thyroid: Secondary | ICD-10-CM | POA: Diagnosis not present

## 2024-05-12 DIAGNOSIS — E890 Postprocedural hypothyroidism: Secondary | ICD-10-CM | POA: Diagnosis not present

## 2024-05-12 NOTE — Patient Instructions (Signed)
levo

## 2024-05-12 NOTE — Progress Notes (Signed)
 Name: Carla Knight  MRN/ DOB: [id number], 12-30-1975    Age/ Sex: 48 y.o., female    PCP: Sheryle Carwin, MD   Reason for Endocrinology Evaluation: PTC/Hypothyroidism     Date of Initial Endocrinology Evaluation: 10/31/2021    HPI: Carla Knight is a 48 y.o. female with a past medical history of T2DM, Postoperative Hypothyroidism and , CAD ,HTN. The patient presented for initial endocrinology clinic visit on 10/31/2021 for consultative assistance with her PTC/Hypothyroidism.   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  S/P Thyrogen  - stimulated RAI  of 54.8 mCi   Post-radioiodine whole body scan on 08/03/12 showed findings consistent with residual functioning thyroid  tissue within the thyroid  bed, with no evidence for distant metastatic disease.    S/P excision of benign right parotid mass 12/17/2011   Mother with thyroid  disease    SUBJECTIVE:    Today (05/12/24):  Carla Knight is here for a follow up on Hx of PTC and postoperative Hypothyroidism    She follows with cardiology for CAD, s/p PCI No chest pain , no SOB  Denies palpitations  Denies local neck symptoms  Denies constipation or diarrhea  No biotin intake  Synthroid  150 mcg daily     HISTORY:  Past Medical History:  Past Medical History:  Diagnosis Date   Diabetes mellitus    NIDDM, type 2    Dyslipidemia    Goiter    no current meds.   Hypertension    under control; has been on med. x 5-6 yrs.   Hypothyroidism    Papillary thyroid  carcinoma (HCC)    incidentally found during total thyroidectomy , neegative for metastasis   Parotid mass 11/2011   right   PONV (postoperative nausea and vomiting)    nauseated when ate after surgery   Thyroid  mass    Past Surgical History:  Past Surgical History:  Procedure Laterality Date   ABLATION  2016 or 2017 unsure   obgyn dr kandyce    CESAREAN SECTION  10/18/2008    CESAREAN SECTION W/BTL  04/08/2011   CORONARY STENT INTERVENTION N/A 07/02/2022   Procedure: CORONARY STENT INTERVENTION;  Surgeon: Claudene Victory ORN, MD;  Location: [facility];  Service: Cardiovascular;  Laterality: N/A;   LAPAROSCOPIC CHOLECYSTECTOMY  06/05/2009   LEFT HEART CATH AND CORONARY ANGIOGRAPHY N/A 07/02/2022   Procedure: LEFT HEART CATH AND CORONARY ANGIOGRAPHY;  Surgeon: Claudene Victory ORN, MD;  Location: [facility];  Service: Cardiovascular;  Laterality: N/A;   LEFT HEART CATH AND CORONARY ANGIOGRAPHY N/A 07/07/2023   Procedure: LEFT HEART CATH AND CORONARY ANGIOGRAPHY;  Surgeon: Anner Alm ORN, MD;  Location: [facility];  Service: Cardiovascular;  Laterality: N/A;   PAROTIDECTOMY  12/17/2011   Procedure: PAROTIDECTOMY;  Surgeon: Ana ORN Moccasin, MD;  Location: [facility];  Service: ENT;  Laterality: Right;  Total right parotidectomy   ROBOTIC ASSISTED LAPAROSCOPIC HYSTERECTOMY AND SALPINGECTOMY Bilateral 05/01/2018   Procedure: XI ROBOTIC ASSISTED LAPAROSCOPIC HYSTERECTOMY AND SALPINGECTOMY;  Surgeon: kandyce Sor, MD;  Location: [facility];  Service: Gynecology;  Laterality: Bilateral;  Requests 3 1/2 hrs.   ROBOTIC ASSISTED LAPAROSCOPIC LYSIS OF ADHESION  05/01/2018   Procedure: XI ROBOTIC ASSISTED LAPAROSCOPIC LYSIS OF ADHESION;  Surgeon: kandyce Sor, MD;  Location: [facility];  Service: Gynecology;;   THYROIDECTOMY  05/13/2012   Procedure: THYROIDECTOMY;  Surgeon: Ana ORN Moccasin, MD;  Location: [facility];  Service: ENT;  Laterality: Bilateral;  TOTAL THYROIDECTOMY    Social History:  reports that she quit smoking about 22 months ago. Her smoking use included cigarettes. She started smoking about 16 years ago. She has a 3.8 pack-year smoking history. She has never used smokeless tobacco. She reports current alcohol use of about 2.0 standard drinks of alcohol per week. She reports that she does not use drugs. Family History: family history is not on file.   HOME  MEDICATIONS: Allergies as of 05/12/2024       Reactions   Adhesive [tape] Hives        Medication List        Accurate as of May 12, 2024  7:25 AM. If you have any questions, ask your nurse or doctor.          STOP taking these medications    acetaminophen  500 MG tablet Commonly known as: TYLENOL        TAKE these medications    atorvastatin  80 MG tablet Commonly known as: LIPITOR  TAKE 1 TABLET BY MOUTH EVERY EVENING   Childrens Gummies Chew Chew 2 tablets by mouth every evening. Gummy   clopidogrel  75 MG tablet Commonly known as: PLAVIX  Take 1 tablet (75 mg total) by mouth daily.   glimepiride  2 MG tablet Commonly known as: AMARYL  Take 2 mg by mouth daily with breakfast.   icosapent  Ethyl 1 g capsule Commonly known as: VASCEPA  Take 2 capsules (2 g total) by mouth 2 (two) times daily.   Jardiance  25 MG Tabs tablet Generic drug: empagliflozin  Take 25 mg by mouth daily.   ketotifen 0.025 % ophthalmic solution Commonly known as: ZADITOR Place 1 drop into both eyes daily as needed (allergies).   lisinopril  10 MG tablet Commonly known as: ZESTRIL  Take 10 mg by mouth every morning.   Magnesium 200 MG Tabs Take 200 mg by mouth daily.   metFORMIN  1000 MG tablet Commonly known as: GLUCOPHAGE  Take 1 tablet (1,000 mg total) by mouth 2 (two) times daily.   metoprolol  succinate 25 MG 24 hr tablet Commonly known as: Toprol  XL Take 1 tablet (25 mg total) by mouth daily.   nitroGLYCERIN  0.4 MG SL tablet Commonly known as: NITROSTAT  Place 1 tablet (0.4 mg total) under the tongue every 5 (five) minutes as needed for chest pain.   Ozempic  (0.25 or 0.5 MG/DOSE) 2 MG/3ML Sopn Generic drug: Semaglutide (0.25 or 0.5MG /DOS) Inject 0.5 mg into the skin every Sunday.   pioglitazone  15 MG tablet Commonly known as: ACTOS  Take 15 mg by mouth daily.   Synthroid  150 MCG tablet Generic drug: levothyroxine  TAKE 1 TABLET DAILY BEFORE BREAKFAST          REVIEW  OF SYSTEMS: A comprehensive ROS was conducted with the patient and is negative except as per HPI     OBJECTIVE:  VS: BP 120/80 (BP Location: Left Arm, Patient Position: Sitting, Cuff Size: Normal)   Pulse 83   Ht 5' 3 (1.6 m)   Wt 169 lb (76.7 kg)   LMP 04/29/2016 Comment: LMP was over 2 years ago pre-ablation   SpO2 98%   BMI 29.94 kg/m     Wt Readings from Last 3 Encounters:  09/16/23 179 lb (81.2 kg)  07/04/23 178 lb 3.2 oz (80.8 kg)  05/13/23 175 lb (79.4 kg)     EXAM: General: Pt appears well and is in NAD  Neck: General: Supple without adenopathy. Thyroid :  No goiter or nodules appreciated.  Lungs:  Clear with good BS bilat   Heart: Auscultation: RRR.  Abdomen:  soft, nontender  Extremities:  BL LE: No pretibial edema   Mental Status: Judgment, insight: Intact Orientation: Oriented to time, place, and person Mood and affect: No depression, anxiety, or agitation     DATA REVIEWED:   Latest Reference Range & Units 05/12/24 09:34  TSH mIU/L 0.47  Thyroglobulin ng/mL <0.1 (L)  Thyroglobulin Ab < or = 1 IU/mL <1  (L): Data is abnormally low  Thyroid  Ultrasound 11/21/2021  Isthmus: Surgically absent. There is no residual nodular soft tissue within the isthmic resection bed.   Right lobe: Surgically absent. There is no residual nodular soft tissue within the right lobectomy resection bed.   Left lobe: Surgically absent. There is no residual nodular soft tissue within the left lobectomy resection bed.   _________________________________________________________   No regional cervical lymphadenopathy.   IMPRESSION: Post total thyroidectomy without evidence of residual or locally recurrent disease.   ASSESSMENT/PLAN/RECOMMENDATIONS:   Hx of PTC , Stage I :  - She is S/P total thyroidectomy in 04/2012 with a 4 cm PTC , follicular variant. She is S/P remnant ablation with 54.8 mCi of I-131 -Patient with excellent structural and biochemical  response -Historically TG, TG AB undetectable, pending today - Thyroid  bed ultrasound 11/2021 showed no evidence of recurrence, would not repeat ultrasound unless there is a concern about elevated TG - TSH goal 0.5-2.0 uIU/mL      2. Postoperative Hypothyroidism :  - Patient is clinically euthyroid -No local neck symptoms - She is on Brand name Synthroid  and gets this from [facility] -TSH  is lower than the goal, will reduce as below  - Will recheck in 2 months   Medications :  Stop Synthroid  150 mcg daily Start Synthroid  137 mcg daily    Labs in 2 months  F/U in 1 yr   Signed electronically by: Stefano Redgie Butts, MD  [facility] Endocrinology  [facility] [address] [place] [place], [place] [address] Phone: [phone] FAX: [phone]   CC: Sheryle Carwin, MD [address] [place] [address] Phone: [phone] Fax: [phone]   Return to Endocrinology clinic as below: Future Appointments  Date Time Provider Department Center  05/12/2024  9:10 AM Dequavious Harshberger, Donell Redgie, MD [id number] None

## 2024-05-13 LAB — TSH: TSH: 0.47 m[IU]/L

## 2024-05-13 LAB — THYROGLOBULIN LEVEL: Thyroglobulin: <0.1 ng/mL — ABNORMAL LOW

## 2024-05-13 LAB — THYROGLOBULIN ANTIBODY: Thyroglobulin Ab: <1 [IU]/mL (ref <=1)

## 2024-05-14 ENCOUNTER — Ambulatory Visit: Payer: Self-pay | Admitting: Internal Medicine

## 2024-05-14 MED ORDER — SYNTHROID 137 MCG PO TABS: 137.0000 ug | ORAL_TABLET | Freq: Every day | ORAL | 3 refills | Status: AC

## 2024-06-14 ENCOUNTER — Telehealth: Payer: Self-pay | Admitting: Nurse Practitioner

## 2024-06-14 MED ORDER — METOPROLOL SUCCINATE ER 25 MG PO TB24: 25.0000 mg | ORAL_TABLET | Freq: Every day | ORAL | 0 refills | Status: DC

## 2024-06-14 MED ORDER — CLOPIDOGREL BISULFATE 75 MG PO TABS: 75.0000 mg | ORAL_TABLET | Freq: Every day | ORAL | 0 refills | Status: DC

## 2024-06-14 NOTE — Telephone Encounter (Signed)
*  STAT* If patient is at the pharmacy, call can be transferred to refill team.   1. Which medications need to be refilled? (please list name of each medication and dose if known)   clopidogrel  (PLAVIX ) 75 MG tablet Take 1 tablet (75 mg total) by mouth daily.    metoprolol  succinate (TOPROL  XL) 25 MG 24 hr tablet (Expired) Take 1 tablet (25 mg total) by mouth daily.     4. Which pharmacy/location (including street and city if local pharmacy) is medication to be sent to? [id number] [place], [place] - [address]    5. Do they need a 30 day or 90 day supply? 90   Scheduled for yearly with Monge, NP 09/06/24

## 2024-06-30 ENCOUNTER — Other Ambulatory Visit (HOSPITAL_COMMUNITY): Payer: Self-pay

## 2024-09-06 ENCOUNTER — Encounter: Payer: Self-pay | Admitting: Nurse Practitioner

## 2024-09-06 ENCOUNTER — Ambulatory Visit: Attending: Nurse Practitioner | Admitting: Nurse Practitioner

## 2024-09-06 VITALS — BP 126/80 | HR 80 | Ht 63.0 in | Wt 166.0 lb

## 2024-09-06 DIAGNOSIS — I10 Essential (primary) hypertension: Secondary | ICD-10-CM

## 2024-09-06 DIAGNOSIS — E1159 Type 2 diabetes mellitus with other circulatory complications: Secondary | ICD-10-CM | POA: Diagnosis not present

## 2024-09-06 DIAGNOSIS — I2510 Atherosclerotic heart disease of native coronary artery without angina pectoris: Secondary | ICD-10-CM

## 2024-09-06 DIAGNOSIS — Z794 Long term (current) use of insulin: Secondary | ICD-10-CM

## 2024-09-06 DIAGNOSIS — E785 Hyperlipidemia, unspecified: Secondary | ICD-10-CM | POA: Diagnosis not present

## 2024-09-06 DIAGNOSIS — E890 Postprocedural hypothyroidism: Secondary | ICD-10-CM

## 2024-09-06 NOTE — Patient Instructions (Signed)
 Medication Instructions:  Your physician recommends that you continue on your current medications as directed. Please refer to the Current Medication list given to you today.  *If you need a refill on your cardiac medications before your next appointment, please call your pharmacy*  Lab Work: Fasting lipid panel & CMET Testing/Procedures: NONE ordered at this time of appointment    Follow-Up: At [facility], you and your health needs are our priority.  As part of our continuing mission to provide you with exceptional heart care, our providers are all part of one team.  This team includes your primary Cardiologist (physician) and Advanced Practice Providers or APPs (Physician Assistants and Nurse Practitioners) who all work together to provide you with the care you need, when you need it.  Your next appointment:    1 year Dr. Anner    We recommend signing up for the patient portal called MyChart.  Sign up information is provided on this After Visit Summary.  MyChart is used to connect with patients for Virtual Visits (Telemedicine).  Patients are able to view lab/test results, encounter notes, upcoming appointments, etc.  Non-urgent messages can be sent to your provider as well.   To learn more about what you can do with MyChart, go to [email or web address].

## 2024-09-06 NOTE — Progress Notes (Signed)
 Office Visit    Patient Name: Carla Knight Date of Encounter: 09/06/2024  Primary Care Provider:  Sheryle Carwin, MD Primary Cardiologist:  Alm Clay, MD  Chief Complaint    48 year old female with a history of CAD s/p DES-LAD in 06/2022, hypertension, hyperlipidemia, type 2 diabetes, and papillary thyroid  cancer s/p total thyroidectomy who presents for follow-up related to CAD.   Past Medical History    Past Medical History:  Diagnosis Date   Diabetes mellitus    NIDDM, type 2    Dyslipidemia    Goiter    no current meds.   Hypertension    under control; has been on med. x 5-6 yrs.   Hypothyroidism    Papillary thyroid  carcinoma (HCC)    incidentally found during total thyroidectomy , neegative for metastasis   Parotid mass 11/2011   right   PONV (postoperative nausea and vomiting)    nauseated when ate after surgery   Thyroid  mass    Past Surgical History:  Procedure Laterality Date   ABLATION  2016 or 2017 unsure   obgyn dr kandyce    CESAREAN SECTION  10/18/2008   CESAREAN SECTION W/BTL  04/08/2011   CORONARY STENT INTERVENTION N/A 07/02/2022   Procedure: CORONARY STENT INTERVENTION;  Surgeon: Claudene Victory ORN, MD;  Location: [facility];  Service: Cardiovascular;  Laterality: N/A;   LAPAROSCOPIC CHOLECYSTECTOMY  06/05/2009   LEFT HEART CATH AND CORONARY ANGIOGRAPHY N/A 07/02/2022   Procedure: LEFT HEART CATH AND CORONARY ANGIOGRAPHY;  Surgeon: Claudene Victory ORN, MD;  Location: [facility];  Service: Cardiovascular;  Laterality: N/A;   LEFT HEART CATH AND CORONARY ANGIOGRAPHY N/A 07/07/2023   Procedure: LEFT HEART CATH AND CORONARY ANGIOGRAPHY;  Surgeon: Clay Alm ORN, MD;  Location: [facility]VASIVE CV LAB;  Service: Cardiovascular;  Laterality: N/A;   PAROTIDECTOMY  12/17/2011   Procedure: PAROTIDECTOMY;  Surgeon: Ana ORN Moccasin, MD;  Location: [facility];  Service: ENT;  Laterality: Right;  Total right parotidectomy   ROBOTIC ASSISTED  LAPAROSCOPIC HYSTERECTOMY AND SALPINGECTOMY Bilateral 05/01/2018   Procedure: XI ROBOTIC ASSISTED LAPAROSCOPIC HYSTERECTOMY AND SALPINGECTOMY;  Surgeon: kandyce Sor, MD;  Location: [facility];  Service: Gynecology;  Laterality: Bilateral;  Requests 3 1/2 hrs.   ROBOTIC ASSISTED LAPAROSCOPIC LYSIS OF ADHESION  05/01/2018   Procedure: XI ROBOTIC ASSISTED LAPAROSCOPIC LYSIS OF ADHESION;  Surgeon: kandyce Sor, MD;  Location: [facility];  Service: Gynecology;;   THYROIDECTOMY  05/13/2012   Procedure: THYROIDECTOMY;  Surgeon: Ana ORN Moccasin, MD;  Location: [facility];  Service: ENT;  Laterality: Bilateral;  TOTAL THYROIDECTOMY    Allergies  Allergies  Allergen Reactions   Adhesive [Tape] Hives     Labs/Other Studies Reviewed    The following studies were reviewed today:  Cardiac Studies & Procedures   ______________________________________________________________________________________________ CARDIAC CATHETERIZATION  CARDIAC CATHETERIZATION 07/07/2023  Conclusion   Prox LAD to Mid LAD stent has focal 25% in-stnet restenosed with 45% stenosed side branch in 1st Diag. (Stable from Post PCI)   LV end diastolic pressure is normal.   There is no aortic valve stenosis.   Widely patent LAD stent with mild ISR & stable ostial Diag 45% stenosis with TIMI 3 flow & no flow limiting lesions.   RECOMMENDATIONS   In the absence of any other complications or medical issues, we expect the patient to be ready for discharge from a cath perspective on 07/07/2023.   Continue DAPT to complete 1 year -would then convert to  maintenance dose Brilinta  60 mg BID or Plavix  75 mg daily monotherapy     Alm Clay, MD  Findings Coronary Findings Diagnostic  Dominance: Right  Left Main Vessel was injected. Vessel is normal in caliber.  Left Anterior Descending Prox LAD to Mid LAD lesion is 25% stenosed with 45% stenosed side branch in 1st Diag. The lesion is located at the bifurcation. The lesion was  previously treated using a drug eluting stent between 1-2 years ago. Previously placed stent displays restenosis.  Ramus Intermedius Vessel is small. Vessel is angiographically normal.  Left Circumflex Vessel is angiographically normal.  First Obtuse Marginal Branch Vessel is small in size.  Left Posterior Atrioventricular Artery Vessel is small in size.  Right Coronary Artery Vessel was injected. Vessel is normal in caliber. Vessel is angiographically normal. Anterior & borderline high with downward ostium  Right Ventricular Branch Vessel is small in size.  Right Posterior Descending Artery Vessel is small in size.  Right Posterior Atrioventricular Artery Vessel is moderate in size.  Intervention  No interventions have been documented.   CARDIAC CATHETERIZATION  CARDIAC CATHETERIZATION 07/02/2022  Conclusion CONCLUSIONS: 90% [place] 011 proximal to mid LAD stenosis with large diagonal #1. Successful single stent in LAD crossing the diagonal without compromise in reducing the stenosis to 0% with TIMI grade III flow.  Stent size 3.0 x 24 mm Onyx deployed at 14 atm. Left main is normal Circumflex and ramus are widely patent and normal Right coronary is dominant and widely patent. Normal LV function with EF greater than 65%.  LVEDP 14 mmHg.  RECOMMENDATIONS:  Intensify antilipid therapy Aspirin  and Brilinta  x12 months. Home in a.m. if no chest discomfort or problems.  Findings Coronary Findings Diagnostic  Dominance: Right  Left Anterior Descending Prox LAD to Mid LAD lesion is 90% stenosed.  First Diagonal Branch 1st Diag lesion is 30% stenosed.  Intervention  Prox LAD to Mid LAD lesion Stent A stent was successfully placed. Post-Intervention Lesion Assessment The intervention was successful. Pre-interventional TIMI flow is 3. Post-intervention TIMI flow is 3. No complications occurred at this lesion. There is a 0% residual stenosis post  intervention.     ECHOCARDIOGRAM  ECHOCARDIOGRAM COMPLETE 04/02/2023  Narrative ECHOCARDIOGRAM REPORT    Patient Name:   Carla Knight Date of Exam: 04/02/2023 Medical Rec #:  [id number]         Height:       63.0 in Accession #:    [id number]        Weight:       175.4 lb Date of Birth:  April 21, 1976         BSA:          1.829 m Patient Age:    47 years          BP:           132/85 mmHg Patient Gender: F                 HR:           83 bpm. Exam Location:  [facility]  Procedure: 2D Echo, Cardiac Doppler and Color Doppler  Indications:    CAD (coronary artery disease) [[id number]]  History:        Patient has no prior history of Echocardiogram examinations. Risk Factors:Hypertension, Diabetes, Dyslipidemia and Former Smoker. Hx of COVID-19. Patient states she is s/p cardiac stenting.  Sonographer:    Aida Pizza RCS Referring Phys: [id number] RONAL BRAVO BRANCH  IMPRESSIONS  1. Left ventricular ejection fraction, by estimation, is 60 to 65%. The left ventricle has normal function. The left ventricle has no regional wall motion abnormalities. Indeterminate diastolic filling due to E-A fusion. 2. Right ventricular systolic function is normal. The right ventricular size is normal. Tricuspid regurgitation signal is inadequate for assessing PA pressure. 3. The mitral valve is abnormal. No evidence of mitral valve regurgitation. No evidence of mitral stenosis. Moderate mitral annular calcification. 4. The aortic valve was not well visualized. Aortic valve regurgitation is not visualized. No aortic stenosis is present. 5. The inferior vena cava is normal in size with greater than 50% respiratory variability, suggesting right atrial pressure of 3 mmHg.  Comparison(s): No prior Echocardiogram.  FINDINGS Left Ventricle: Left ventricular ejection fraction, by estimation, is 60 to 65%. The left ventricle has normal function. The left ventricle has no regional wall motion abnormalities. The  left ventricular internal cavity size was normal in size. There is no left ventricular hypertrophy. Indeterminate diastolic filling due to E-A fusion.  Right Ventricle: The right ventricular size is normal. No increase in right ventricular wall thickness. Right ventricular systolic function is normal. Tricuspid regurgitation signal is inadequate for assessing PA pressure.  Left Atrium: Left atrial size was normal in size.  Right Atrium: Right atrial size was normal in size.  Pericardium: There is no evidence of pericardial effusion.  Mitral Valve: The mitral valve is abnormal. Moderate mitral annular calcification. No evidence of mitral valve regurgitation. No evidence of mitral valve stenosis.  Tricuspid Valve: The tricuspid valve is grossly normal. Tricuspid valve regurgitation is not demonstrated. No evidence of tricuspid stenosis.  Aortic Valve: The aortic valve was not well visualized. Aortic valve regurgitation is not visualized. No aortic stenosis is present.  Pulmonic Valve: The pulmonic valve was normal in structure. Pulmonic valve regurgitation is trivial. No evidence of pulmonic stenosis.  Aorta: The aortic root is normal in size and structure.  Venous: The inferior vena cava is normal in size with greater than 50% respiratory variability, suggesting right atrial pressure of 3 mmHg.  IAS/Shunts: No atrial level shunt detected by color flow Doppler.   LEFT VENTRICLE PLAX 2D LVIDd:         4.00 cm   Diastology LVIDs:         2.20 cm   LV e' medial:    9.36 cm/s LV PW:         1.00 cm   LV E/e' medial:  9.6 LV IVS:        1.00 cm   LV e' lateral:   9.36 cm/s LVOT diam:     1.90 cm   LV E/e' lateral: 9.6 LV SV:         61 LV SV Index:   33 LVOT Area:     2.84 cm   RIGHT VENTRICLE RV S prime:     12.60 cm/s TAPSE (M-mode): 2.3 cm  LEFT ATRIUM           Index        RIGHT ATRIUM           Index LA diam:      3.50 cm 1.91 cm/m   RA Area:     15.50 cm LA Vol (A2C):  38.2 ml 20.89 ml/m  RA Volume:   36.00 ml  19.69 ml/m LA Vol (A4C): 50.6 ml 27.67 ml/m AORTIC VALVE LVOT Vmax:   99.40 cm/s LVOT Vmean:  72.950 cm/s LVOT VTI:    0.216  m  AORTA Ao Root diam: 3.00 cm  MITRAL VALVE MV Area (PHT): 1.94 cm    SHUNTS MV Decel Time: 391 msec    Systemic VTI:  0.22 m MV E velocity: 90.20 cm/s  Systemic Diam: 1.90 cm MV A velocity: 87.10 cm/s MV E/A ratio:  1.04  Vishnu Priya Mallipeddi Electronically signed by Diannah Late Mallipeddi Signature Date/Time: 04/02/2023/2:56:23 PM    Final      CT SCANS  CT CORONARY FRACTIONAL FLOW RESERVE DATA PREP 06/25/2022  Narrative EXAM: FFRCT ANALYSIS  FINDINGS: FFRct analysis was performed on the original cardiac CT angiogram dataset. Diagrammatic representation of the FFRct analysis is provided in a separate PDF document in PACS. This dictation was created using the PDF document and an interactive 3D model of the results. 3D model is not available in the EMR/PACS. Normal FFR range is >0.80.  1. Left Main: No significant stenosis  2. LAD: CTFFR 0.77 across lesion in proximal LAD  3. LCX: No significant stenosis  4. Ramus: No significant stenosis  5. RCA: No significant stenosis  IMPRESSION: 1. CTFFR 0.77 across lesion in proximal LAD, suggesting lesion is functionally significant. Cardiac catheterization recommended   Electronically Signed By: Lonni Nanas M.D. On: 06/25/2022 16:02   CT CORONARY MORPH W/CTA COR W/SCORE 06/25/2022  Addendum 06/25/2022  3:58 PM ADDENDUM REPORT: 06/25/2022 15:56  CLINICAL DATA:  57F with chest pain  EXAM: Cardiac/Coronary CTA  TECHNIQUE: The patient was scanned on a [employer].  FINDINGS: A 100 kV prospective scan was triggered in the descending thoracic aorta at 111 HU's. Axial non-contrast 3 mm slices were carried out through the heart. The data set was analyzed on a dedicated work station and scored using the Agatson  method. Gantry rotation speed was 250 msecs and collimation was .6 mm. 0.8 mg of sl NTG was given. The 3D data set was reconstructed in 5% intervals of the 35-75 % of the R-R cycle. Phases were analyzed on a dedicated work station using MPR, MIP and VRT modes. The patient received 100 cc of contrast.  Coronary Arteries:  Normal coronary origin.  Right dominance.  RCA is a large dominant artery that gives rise to PDA and PLA. Calcified plaque in the mid RCA causes 0-24% stenosis  Left main is a large artery that gives rise to LAD and LCX arteries.  LAD is a large vessel. Mixed plaque in the proximal LAD causes severe (70-99%) stenosis  LCX is a non-dominant artery that gives rise to one large OM1 branch. There is no plaque.  Ramus has no plaque.  Other findings:  Left Ventricle: Normal size  Left Atrium: Normal size  Pulmonary Veins: Normal configuration  Right Ventricle: Normal size  Right Atrium: Normal size  Cardiac valves: No calcifications  Thoracic aorta: Normal size  Pulmonary Arteries: Normal size  Systemic Veins: Normal drainage  Pericardium: Normal thickness  IMPRESSION: 1. Coronary calcium  score of 75. This was 99th percentile for age and sex matched control.  2. Normal coronary origin with right dominance.  3. Obstructive CAD  4. Mixed plaque in the proximal LAD causes severe (70-99%) stenosis  5. Cardiac catheterization recommended  CAD-RADS 4 Severe stenosis. (70-99% or > 50% left main). Cardiac catheterization or CT FFR is recommended. Consider symptom-guided anti-ischemic pharmacotherapy as well as risk factor modification per guideline directed care.   Electronically Signed By: Lonni Nanas M.D. On: 06/25/2022 15:56  Narrative EXAM: OVER-READ INTERPRETATION  CT CHEST  The following report is  a limited chest CT over-read performed by radiologist Dr. Lynwood Landy Raddle of [facility] on 06/25/2022.The coronary CTA  interpretation by the cardiologist is attached.  COMPARISON:  None Available.  FINDINGS: Vascular: Visualized extracardiac vascular structures are unremarkable.  Mediastinum/Nodes: Visualized mediastinum is unremarkable.  Lungs/Pleura: Visualized pulmonary parenchyma is unremarkable.  Upper Abdomen: Visualized upper abdomen is unremarkable.  Musculoskeletal: No chest wall mass or suspicious bone lesions identified.  IMPRESSION: No definite abnormality seen involving the visualized extracardiac structures of the chest.  Electronically Signed: By: Lynwood Landy Raddle M.D. On: 06/25/2022 14:14     ______________________________________________________________________________________________     Recent Labs: 05/12/2024: TSH 0.47  Recent Lipid Panel    Component Value Date/Time   CHOL 107 08/30/2022 0853   TRIG 211 (H) 08/30/2022 0853   HDL 34 (L) 08/30/2022 0853   CHOLHDL 3.1 08/30/2022 0853   CHOLHDL 4.8 07/03/2022 0622   VLDL 78 (H) 07/03/2022 0622   LDLCALC 39 08/30/2022 0853    History of Present Illness    48 year old female with a history of CAD s/p DES-LAD in 06/2022, hypertension, hyperlipidemia, type 2 diabetes, papillary thyroid  cancer s/p total thyroidectomy.   She was referred to cardiology in 05/2022 following an ED visit for chest pain. Coronary CTA in 06/2022 revealed coronary calcium  score of 75 (99th percentile), significant lesion in proximal LAD. Cardiac catheterization in 06/2022 revealed 90% p-mLAD stenosis s/p DES, otherwise normal coronary arteries, EF > 65%.   Echocardiogram in 03/2023 showed EF 60 to 65%, normal LV function, no RWMA, normal RV systolic function, moderate mitral annular calcification, no evidence of mitral stenosis. Repeat cardiac catheterization in 06/2023 in the setting of persistent chest pressure revealed proximal to mid LAD 25% ISR, 45% stenosis sidebranch first diagonal, otherwise normal coronary arteries.  She was last seen in the  office on 09/16/2023 and was stable from a cardiac standpoint.  She denies symptoms concerning for angina.  She was transitioned to Plavix  monotherapy.  She presents today for follow-up.  Since her last visit she has been stable from a cardiac standpoint.  She notes occasional left upper chest discomfort in the setting of stress, symptoms last for minutes and resolve spontaneously.  She denies any exertional symptoms.  She has been under significant amount of stress at her job.  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  Otherwise, she reports feeling well.  Home Medications    Current Outpatient Medications  Medication Sig Dispense Refill   atorvastatin  (LIPITOR ) 80 MG tablet TAKE 1 TABLET BY MOUTH EVERY EVENING 90 tablet 3   buPROPion (WELLBUTRIN XL) 300 MG 24 hr tablet Take 300 mg by mouth daily.     clopidogrel  (PLAVIX ) 75 MG tablet Take 1 tablet (75 mg total) by mouth daily. 90 tablet 0   glimepiride  (AMARYL ) 2 MG tablet Take 2 mg by mouth daily with breakfast.     icosapent  Ethyl (VASCEPA ) 1 g capsule Take 2 capsules (2 g total) by mouth 2 (two) times daily. 360 capsule 1   JARDIANCE  25 MG TABS tablet Take 25 mg by mouth daily.     lisinopril  (PRINIVIL ,ZESTRIL ) 10 MG tablet Take 10 mg by mouth every morning.      Magnesium 200 MG TABS Take 200 mg by mouth daily.     metFORMIN  (GLUCOPHAGE ) 1000 MG tablet Take 1 tablet (1,000 mg total) by mouth 2 (two) times daily.     metoprolol   succinate (TOPROL  XL) 25 MG 24 hr tablet Take 1 tablet (25 mg total) by mouth daily. 90 tablet 0   nitroGLYCERIN  (NITROSTAT ) 0.4 MG SL tablet Place 1 tablet (0.4 mg total) under the tongue every 5 (five) minutes as needed for chest pain. 90 tablet 3   OZEMPIC , 2 MG/DOSE, 8 MG/3ML SOPN      Pediatric Multivit-Minerals-C (CHILDRENS GUMMIES) CHEW Chew 2 tablets by mouth every evening. Gummy     pioglitazone  (ACTOS ) 15 MG tablet Take 15 mg by mouth  daily.     SYNTHROID  137 MCG tablet Take 1 tablet (137 mcg total) by mouth daily before breakfast. 90 tablet 3   ketotifen (ZADITOR) 0.025 % ophthalmic solution Place 1 drop into both eyes daily as needed (allergies).     OZEMPIC , 0.25 OR 0.5 MG/DOSE, 2 MG/3ML SOPN Inject 0.5 mg into the skin every Sunday. (Patient not taking: Reported on 05/12/2024)     No current facility-administered medications for this visit.     Review of Systems    She denies chest pain, palpitations, dyspnea, pnd, orthopnea, n, v, dizziness, syncope, edema, weight gain, or early satiety. All other systems reviewed and are otherwise negative except as noted above.   Physical Exam    VS:  BP 126/80 (BP Location: Left Arm, Patient Position: Sitting, Cuff Size: Normal)   Pulse 80   Ht 5' 3 (1.6 m)   Wt 166 lb (75.3 kg)   LMP 04/29/2016 Comment: LMP was over 2 years ago pre-ablation   SpO2 98%   BMI 29.41 kg/m  GEN: Well nourished, well developed, in no acute distress. HEENT: normal. Neck: Supple, no JVD, carotid bruits, or masses. Cardiac: RRR, no murmurs, rubs, or gallops. No clubbing, cyanosis, edema.  Radials/DP/PT 2+ and equal bilaterally.  Respiratory:  Respirations regular and unlabored, clear to auscultation bilaterally. GI: Soft, nontender, nondistended, BS + x 4. MS: no deformity or atrophy. Skin: warm and dry, no rash. Neuro:  Strength and sensation are intact. Psych: Normal affect.  Accessory Clinical Findings    ECG personally reviewed by me today - EKG Interpretation Date/Time:  Monday September 06 2024 09:09:07 EDT Ventricular Rate:  81 PR Interval:  218 QRS Duration:  84 QT Interval:  376 QTC Calculation: 436 R Axis:   28  Text Interpretation: Sinus rhythm with 1st degree A-V block Abnormal QRS-T angle, consider primary T wave abnormality When compared with ECG of 07-Jul-2023 07:23, PR interval has increased Confirmed by Daneen Perkins ([id number]) on 09/06/2024 9:13:49 AM  - no acute changes.    Lab Results  Component Value Date   WBC 13.7 (H) 07/02/2023   HGB 14.1 07/02/2023   HCT 45.0 07/02/2023   MCV 84 07/02/2023   PLT 391 07/02/2023   Lab Results  Component Value Date   CREATININE 0.60 07/02/2023   BUN 12 07/02/2023   NA 139 07/02/2023   K 4.2 07/02/2023   CL 102 07/02/2023   CO2 21 07/02/2023   Lab Results  Component Value Date   ALT 16 08/30/2022   AST 13 08/30/2022   ALKPHOS 103 08/30/2022   BILITOT 0.6 08/30/2022   Lab Results  Component Value Date   CHOL 107 08/30/2022   HDL 34 (L) 08/30/2022   LDLCALC 39 08/30/2022   TRIG 211 (H) 08/30/2022   CHOLHDL 3.1 08/30/2022    Lab Results  Component Value Date   HGBA1C 7.2 (H) 04/29/2018    Assessment & Plan    1. CAD: S/p DES-LAD  in 06/2022.  Echocardiogram in 03/2023 showed EF 60 to 65%, normal LV function, no RWMA, normal RV systolic function, moderate mitral annular calcification, no evidence of mitral stenosis. Repeat cardiac catheterization in 06/2023 in the setting of persistent chest pressure revealed proximal to mid LAD 25% ISR, 45% stenosis sidebranch first diagonal, otherwise normal coronary arteries.  She notes occasional left upper chest discomfort in the setting of significant stress, symptoms last for a few minutes and resolve spontaneously.  She denies any exertional symptoms.  Continue to monitor.  Reviewed ED precautions.  Continue Plavix , lisinopril , metoprolol , Lipitor , and Vascepa .   2. Hypertension: BP well controlled. Continue current antihypertensive regimen.   3. Hyperlipidemia: No recent LDL on file. Will update fasting lipids, CMET. Continue Lipitor , Vascepa .   4. Type 2 diabetes: A1c was 6.1 in 05/2024. Monitored and managed per PCP.     5. Hypothyroidism: TSH was 0.470 in 04/2024. On levothyroxine . Follows with endocrinology.    6. Disposition: Follow-up in 1 year.  She will establish with Dr. Anner.      Damien JAYSON Braver, NP 09/06/2024, 9:54 AM

## 2024-09-10 ENCOUNTER — Other Ambulatory Visit: Payer: Self-pay | Admitting: Nurse Practitioner

## 2024-10-26 ENCOUNTER — Other Ambulatory Visit: Payer: Self-pay | Admitting: Nurse Practitioner

## 2024-11-28 ENCOUNTER — Other Ambulatory Visit: Payer: Self-pay | Admitting: Nurse Practitioner

## 2025-05-12 ENCOUNTER — Ambulatory Visit: Admitting: Internal Medicine
